# Patient Record
Sex: Female | Born: 1980
Health system: Southern US, Community
[De-identification: ages and names within clinical notes are randomized; demographics above are authoritative.]

## PROBLEM LIST (undated history)

## (undated) DIAGNOSIS — T783XXA Angioneurotic edema, initial encounter: Secondary | ICD-10-CM

## (undated) DIAGNOSIS — Z975 Presence of (intrauterine) contraceptive device: Secondary | ICD-10-CM

## (undated) DIAGNOSIS — L509 Urticaria, unspecified: Secondary | ICD-10-CM

## (undated) DIAGNOSIS — E039 Hypothyroidism, unspecified: Secondary | ICD-10-CM

## (undated) HISTORY — DX: Urticaria, unspecified: L50.9

## (undated) HISTORY — DX: Angioneurotic edema, initial encounter: T78.3XXA

## (undated) HISTORY — PX: COLONOSCOPY: SHX174

## (undated) HISTORY — DX: Hypothyroidism, unspecified: E03.9

## (undated) HISTORY — PX: NO PAST SURGERIES: SHX2092

## (undated) HISTORY — DX: Presence of (intrauterine) contraceptive device: Z97.5

---

## 2015-03-07 LAB — BASIC METABOLIC PANEL: Glucose: 88

## 2015-03-07 LAB — LIPID PANEL
Cholesterol: 144 (ref 0–200)
HDL: 67 (ref 35–70)
LDL Cholesterol: 69
TRIGLYCERIDES: 42 (ref 40–160)

## 2015-03-07 LAB — HM HIV SCREENING LAB: HM HIV Screening: NEGATIVE

## 2016-03-20 LAB — TSH: TSH: 0.99 (ref <=5.90)

## 2017-03-10 ENCOUNTER — Encounter: Payer: Self-pay | Admitting: Family Medicine

## 2017-03-10 ENCOUNTER — Encounter: Payer: Self-pay | Admitting: Internal Medicine

## 2017-03-10 ENCOUNTER — Ambulatory Visit (INDEPENDENT_AMBULATORY_CARE_PROVIDER_SITE_OTHER): Payer: 59 | Admitting: Family Medicine

## 2017-03-10 VITALS — BP 108/68 | HR 73 | Temp 98.2°F | Ht 69.0 in | Wt 188.0 lb

## 2017-03-10 DIAGNOSIS — Z0000 Encounter for general adult medical examination without abnormal findings: Secondary | ICD-10-CM

## 2017-03-10 DIAGNOSIS — Z8719 Personal history of other diseases of the digestive system: Secondary | ICD-10-CM

## 2017-03-10 DIAGNOSIS — Z975 Presence of (intrauterine) contraceptive device: Secondary | ICD-10-CM | POA: Diagnosis not present

## 2017-03-10 DIAGNOSIS — E039 Hypothyroidism, unspecified: Secondary | ICD-10-CM | POA: Diagnosis not present

## 2017-03-10 DIAGNOSIS — Z23 Encounter for immunization: Secondary | ICD-10-CM

## 2017-03-10 DIAGNOSIS — R1013 Epigastric pain: Secondary | ICD-10-CM

## 2017-03-10 LAB — COMPREHENSIVE METABOLIC PANEL
ALT: 16 U/L (ref 0–35)
AST: 16 U/L (ref 0–37)
Albumin: 4.4 g/dL (ref 3.5–5.2)
Alkaline Phosphatase: 41 U/L (ref 39–117)
BUN: 13 mg/dL (ref 6–23)
CO2: 29 mEq/L (ref 19–32)
Calcium: 9.9 mg/dL (ref 8.4–10.5)
Chloride: 105 mEq/L (ref 96–112)
Creatinine, Ser: 0.8 mg/dL (ref 0.40–1.20)
GFR: 86.84 mL/min (ref >60.00)
Glucose, Bld: 87 mg/dL (ref 70–99)
Potassium: 4.5 mEq/L (ref 3.5–5.1)
Sodium: 138 mEq/L (ref 135–145)
Total Bilirubin: 0.4 mg/dL (ref 0.2–1.2)
Total Protein: 7.2 g/dL (ref 6.0–8.3)

## 2017-03-10 LAB — CBC WITH DIFFERENTIAL/PLATELET
Basophils Absolute: 0.1 10*3/uL (ref 0.0–0.1)
Basophils Relative: 0.7 % (ref 0.0–3.0)
Eosinophils Absolute: 0.2 10*3/uL (ref 0.0–0.7)
Eosinophils Relative: 2.4 % (ref 0.0–5.0)
HCT: 40.2 % (ref 36.0–46.0)
Hemoglobin: 13.2 g/dL (ref 12.0–15.0)
Lymphocytes Relative: 24.6 % (ref 12.0–46.0)
Lymphs Abs: 1.7 10*3/uL (ref 0.7–4.0)
MCHC: 33 g/dL (ref 30.0–36.0)
MCV: 94.7 fl (ref 78.0–100.0)
Monocytes Absolute: 0.7 10*3/uL (ref 0.1–1.0)
Monocytes Relative: 10.4 % (ref 3.0–12.0)
Neutro Abs: 4.3 10*3/uL (ref 1.4–7.7)
Neutrophils Relative %: 61.9 % (ref 43.0–77.0)
Platelets: 289 10*3/uL (ref 150.0–400.0)
RBC: 4.25 Mil/uL (ref 3.87–5.11)
RDW: 13.2 % (ref 11.5–15.5)
WBC: 7 10*3/uL (ref 4.0–10.5)

## 2017-03-10 LAB — T4, FREE: Free T4: 1.11 ng/dL (ref 0.60–1.60)

## 2017-03-10 LAB — H. PYLORI ANTIBODY, IGG: H Pylori IgG: NEGATIVE

## 2017-03-10 LAB — TSH: TSH: 0.06 u[IU]/mL — ABNORMAL LOW (ref 0.35–4.50)

## 2017-03-15 ENCOUNTER — Encounter: Payer: Self-pay | Admitting: Family Medicine

## 2017-03-15 DIAGNOSIS — Z8719 Personal history of other diseases of the digestive system: Secondary | ICD-10-CM | POA: Insufficient documentation

## 2017-03-15 DIAGNOSIS — Z975 Presence of (intrauterine) contraceptive device: Secondary | ICD-10-CM

## 2017-03-15 DIAGNOSIS — R1013 Epigastric pain: Secondary | ICD-10-CM | POA: Insufficient documentation

## 2017-03-15 DIAGNOSIS — O99280 Endocrine, nutritional and metabolic diseases complicating pregnancy, unspecified trimester: Secondary | ICD-10-CM | POA: Insufficient documentation

## 2017-03-15 DIAGNOSIS — E039 Hypothyroidism, unspecified: Secondary | ICD-10-CM

## 2017-03-15 HISTORY — DX: Presence of (intrauterine) contraceptive device: Z97.5

## 2017-03-15 HISTORY — DX: Hypothyroidism, unspecified: E03.9

## 2017-03-15 MED ORDER — LEVOTHYROXINE SODIUM 125 MCG PO TABS: 125.0000 ug | ORAL_TABLET | Freq: Every day | ORAL | 3 refills | Status: DC

## 2017-03-15 NOTE — Progress Notes (Signed)
Tina [name] is a 36 y.o. female is here to [facility].   Patient Care Team: Briscoe Deutscher, DO as PCP - General (Family Medicine)   History of Present Illness:   HPI:   1. Hypothyroidism. Current symptoms: none. Patient denies change in energy level, diarrhea, nervousness, palpitations and weight changes. Symptoms have stabilized and been well-controlled.    2. GI Referral. Patient has had a few episodes of painless rectal bleed. She has also noted a few episodes of epigastric pain - sharp, lasting a few minutes, without noted triggers or relief. She has tried PPI, anti-gas medications, stretching, fiber. No significant family history. Her boyfriend is a GI physician and recommends and EGD and colonoscopy.    Health Maintenance Due  Topic Date Due  . HIV Screening  05/06/1997  . TETANUS/TDAP  05/06/2001  . PAP SMEAR  05/07/2003  . INFLUENZA VACCINE  11/19/2016   Depression screen PHQ 2/9 03/10/2017  Decreased Interest 0  Down, Depressed, Hopeless 0  PHQ - 2 Score 0   PMHx, SurgHx, SocialHx, Medications, and Allergies were reviewed in the Visit Navigator and updated as appropriate.   Past Medical History:  Diagnosis Date  . Hypothyroidism 03/15/2017  . IUD (intrauterine device) in place 03/15/2017   Mirena.    History reviewed. No pertinent surgical history. History reviewed. No pertinent family history.   Social History   Tobacco Use  . Smoking status: Never Smoker  . Smokeless tobacco: Never Used  Substance Use Topics  . Alcohol use: Yes    Comment: Social  . Drug use: No   Current Medications and Allergies:   .  levothyroxine (SYNTHROID, LEVOTHROID) 137 MCG tablet, levothyroxine 137 mcg tablet, Disp: , Rfl:  .  levonorgestrel (MIRENA, 52 MG,) 20 MCG/24HR IUD, Mirena 20 mcg/24 hr (5 years) intrauterine device  Take 1 device by intrauterine route., Disp: , Rfl:   Allergies  Allergen Reactions  . Lidocaine Itching    Per pt has had lidocaine after  and was ok  . Penicillins Rash   Review of Systems:   Pertinent items are noted in the HPI. Otherwise, ROS is negative.  Vitals:   Vitals:   03/10/17 1400  BP: 108/68  Pulse: 73  Temp: 98.2 F (36.8 C)  TempSrc: Oral  SpO2: 99%  Weight: 188 lb (85.3 kg)  Height: 5\' 9"  (1.753 m)     Body mass index is 27.76 kg/m.   Physical Exam:   Physical Exam  Constitutional: She is oriented to person, place, and time. She appears well-developed and well-nourished. No distress.  HENT:  Head: Normocephalic and atraumatic.  Right Ear: External ear normal.  Left Ear: External ear normal.  Nose: Nose normal.  Mouth/Throat: Oropharynx is clear and moist.  Eyes: Conjunctivae and EOM are normal. Pupils are equal, round, and reactive to light.  Neck: Normal range of motion. Neck supple. No thyromegaly present.  Cardiovascular: Normal rate, regular rhythm, normal heart sounds and intact distal pulses.  Pulmonary/Chest: Effort normal and breath sounds normal.  Abdominal: Soft. Bowel sounds are normal.  Musculoskeletal: Normal range of motion.  Lymphadenopathy:    She has no cervical adenopathy.  Neurological: She is alert and oriented to person, place, and time.  Skin: Skin is warm and dry. Capillary refill takes less than 2 seconds.  Psychiatric: She has a normal mood and affect. Her behavior is normal.  Nursing note and vitals reviewed.  Results for orders placed or performed in visit on 03/10/17  CBC with Differential/Platelet  Result Value Ref Range   WBC 7.0 4.0 - 10.5 K/uL   RBC 4.25 3.87 - 5.11 Mil/uL   Hemoglobin 13.2 12.0 - 15.0 g/dL   HCT 40.2 36.0 - 46.0 %   MCV 94.7 78.0 - 100.0 fl   MCHC 33.0 30.0 - 36.0 g/dL   RDW 13.2 11.5 - 15.5 %   Platelets 289.0 150.0 - 400.0 K/uL   Neutrophils Relative % 61.9 43.0 - 77.0 %   Lymphocytes Relative 24.6 12.0 - 46.0 %   Monocytes Relative 10.4 3.0 - 12.0 %   Eosinophils Relative 2.4 0.0 - 5.0 %   Basophils Relative 0.7 0.0 - 3.0 %     Neutro Abs 4.3 1.4 - 7.7 K/uL   Lymphs Abs 1.7 0.7 - 4.0 K/uL   Monocytes Absolute 0.7 0.1 - 1.0 K/uL   Eosinophils Absolute 0.2 0.0 - 0.7 K/uL   Basophils Absolute 0.1 0.0 - 0.1 K/uL  Comprehensive metabolic panel  Result Value Ref Range   Sodium 138 135 - 145 mEq/L   Potassium 4.5 3.5 - 5.1 mEq/L   Chloride 105 96 - 112 mEq/L   CO2 29 19 - 32 mEq/L   Glucose, Bld 87 70 - 99 mg/dL   BUN 13 6 - 23 mg/dL   Creatinine, Ser 0.80 0.40 - 1.20 mg/dL   Total Bilirubin 0.4 0.2 - 1.2 mg/dL   Alkaline Phosphatase 41 39 - 117 U/L   AST 16 0 - 37 U/L   ALT 16 0 - 35 U/L   Total Protein 7.2 6.0 - 8.3 g/dL   Albumin 4.4 3.5 - 5.2 g/dL   Calcium 9.9 8.4 - 10.5 mg/dL   GFR 86.84 >60.00 mL/min  TSH  Result Value Ref Range   TSH 0.06 (L) 0.35 - 4.50 uIU/mL  T4, free  Result Value Ref Range   Free T4 1.11 0.60 - 1.60 ng/dL  H. pylori antibody, IgG  Result Value Ref Range   H Pylori IgG Negative Negative   Assessment and Plan:   Diagnoses and all orders for this visit:  Routine physical examination  Acquired hypothyroidism Comments: Recheck of labs and adjustment today.  Orders: -     TSH -     T4, free  Epigastric pain Comments: Intermittent. Not better with standard treatment. To GI for EGD.  Orders: -     H. pylori antibody, IgG -     Ambulatory referral to Gastroenterology  History of rectal bleeding Comments: Painless. > 1 episode. Will refer to GI. Orders: -     CBC with Differential/Platelet -     Comprehensive metabolic panel -     Ambulatory referral to Gastroenterology  IUD (intrauterine device) in place Comments: Mirena.   Need for HPV vaccination Comments: Agreed to vaccine, given new indications. Will check to see if insurance covers.   Patient Counseling:   [x]     Nutrition: Stressed importance of moderation in sodium/caffeine intake, saturated fat and cholesterol, caloric balance, sufficient intake of fresh fruits, vegetables, fiber, calcium,  iron, and 1 mg of folate supplement per day (for females capable of pregnancy).   [x]      Stressed the importance of regular exercise.    [x]     Substance Abuse: Discussed cessation/primary prevention of tobacco, alcohol, or other drug use; driving or other dangerous activities under the influence; availability of treatment for abuse.    [x]      Injury prevention: Discussed safety belts, safety helmets, smoke  detector, smoking near bedding or upholstery.    [x]      Sexuality: Discussed sexually transmitted diseases, partner selection, use of condoms, avoidance of unintended pregnancy  and contraceptive alternatives.    [x]     Dental health: Discussed importance of regular tooth brushing, flossing, and dental visits.   [x]      Health maintenance and immunizations reviewed. Please refer to Health maintenance section.   . Reviewed expectations re: course of current medical issues. . Discussed self-management of symptoms. . Outlined signs and symptoms indicating need for more acute intervention. . Patient verbalized understanding and all questions were answered. Marland Kitchen Health Maintenance issues including appropriate healthy diet, exercise, and smoking avoidance were discussed with patient. . See orders for this visit as documented in the electronic medical record. . Patient received an After Visit Summary.   Briscoe Deutscher, DO [place] [place] 03/15/2017  Future Appointments  Date Time Provider [place]  05/13/2017  8:30 AM Pyrtle, Lajuan Lines, MD [facility]

## 2017-03-16 ENCOUNTER — Other Ambulatory Visit: Payer: Self-pay

## 2017-03-16 ENCOUNTER — Other Ambulatory Visit: Payer: Self-pay | Admitting: Surgical

## 2017-03-16 DIAGNOSIS — E039 Hypothyroidism, unspecified: Secondary | ICD-10-CM

## 2017-03-16 MED ORDER — LEVOTHYROXINE SODIUM 125 MCG PO TABS: 125.0000 ug | ORAL_TABLET | Freq: Every day | ORAL | 3 refills | Status: DC

## 2017-03-16 MED FILL — LEVOTHYROXINE 125 MCG TABLE: 125 | 90 days supply | Qty: 90 | Fill #0

## 2017-03-26 ENCOUNTER — Encounter: Payer: Self-pay | Admitting: Surgical

## 2017-04-17 DIAGNOSIS — H00021 Hordeolum internum right upper eyelid: Secondary | ICD-10-CM | POA: Diagnosis not present

## 2017-04-17 DIAGNOSIS — H5213 Myopia, bilateral: Secondary | ICD-10-CM | POA: Diagnosis not present

## 2017-04-17 DIAGNOSIS — Z973 Presence of spectacles and contact lenses: Secondary | ICD-10-CM | POA: Diagnosis not present

## 2017-04-23 ENCOUNTER — Encounter: Payer: Self-pay | Admitting: Physical Therapy

## 2017-05-05 ENCOUNTER — Other Ambulatory Visit (INDEPENDENT_AMBULATORY_CARE_PROVIDER_SITE_OTHER): Payer: 59

## 2017-05-05 DIAGNOSIS — E039 Hypothyroidism, unspecified: Secondary | ICD-10-CM | POA: Diagnosis not present

## 2017-05-05 LAB — TSH: TSH: 1.6 u[IU]/mL (ref 0.35–4.50)

## 2017-05-13 ENCOUNTER — Encounter: Payer: Self-pay | Admitting: Internal Medicine

## 2017-05-13 ENCOUNTER — Ambulatory Visit (INDEPENDENT_AMBULATORY_CARE_PROVIDER_SITE_OTHER): Payer: 59 | Admitting: Internal Medicine

## 2017-05-13 VITALS — BP 106/62 | HR 70 | Ht 69.0 in | Wt 190.0 lb

## 2017-05-13 DIAGNOSIS — K625 Hemorrhage of anus and rectum: Secondary | ICD-10-CM | POA: Diagnosis not present

## 2017-05-13 DIAGNOSIS — R1012 Left upper quadrant pain: Secondary | ICD-10-CM

## 2017-05-13 MED ORDER — GLYCOPYRROLATE 1 MG PO TABS: 1.0000 mg | ORAL_TABLET | Freq: Two times a day (BID) | ORAL | 2 refills | Status: DC | PRN

## 2017-05-13 MED ORDER — SUPREP BOWEL PREP KIT 17.5-3.13-1.6 GM/177ML PO SOLN: 1.0000 | ORAL | 0 refills | Status: DC

## 2017-05-13 MED FILL — SUPREP BOWEL PREP KIT: 17.5-3.13-1 | 1 days supply | Qty: 354 | Fill #0

## 2017-05-13 MED FILL — GLYCOPYRROLATE 1 MG TABLET: 1 | 30 days supply | Qty: 60 | Fill #0

## 2017-05-13 NOTE — Patient Instructions (Signed)
You have been scheduled for an endoscopy and colonoscopy. Please follow the written instructions given to you at your visit today. Please pick up your prep supplies at the pharmacy within the next 1-3 days. If you use inhalers (even only as needed), please bring them with you on the day of your procedure. Your physician has requested that you go to www.startemmi.com and enter the access code given to you at your visit today. This web site gives a general overview about your procedure. However, you should still follow specific instructions given to you by our office regarding your preparation for the procedure.  We have sent the following medications to your pharmacy for you to pick up at your convenience: Robinul 1 mg twice daily as needed  If you are age 27 or older, your body mass index should be between 23-30. Your Body mass index is 28.06 kg/m. If this is out of the aforementioned range listed, please consider follow up with your Primary Care Provider.  If you are age 75 or younger, your body mass index should be between 19-25. Your Body mass index is 28.06 kg/m. If this is out of the aformentioned range listed, please consider follow up with your Primary Care Provider.

## 2017-05-13 NOTE — Progress Notes (Signed)
Patient ID: Tina [name], female   DOB: 1980/06/25, 37 y.o.   MRN: [id number] HPI: Tina [name] is a 37 year old female with history of hypothyroidism who is seen in consultation at the request of Dr. Juleen China to discuss intermittent rectal bleeding and intermittent left middle abdominal pain.  She is here alone today.  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    She reports that she has had 3 episodes of painless bright red blood per rectum.  This is been over the past year.  Last episode was in November 2018 when she had what she described as "a lot" of blood per rectum.  This was associated with a bowel movement and was located on the stool, in the toilet water but also with wiping.  This was painless in nature.  She does not know if she has hemorrhoids as she does not have prolapsing, itching or perianal discomfort.  She can be constipated if she does not eat enough fiber in her diet.  No diarrhea.  No recent abdominal pain.  She states that she has had episodic left middle abdominal pain attacks since she was a teenager.  She estimates this occurs 6-12 times per year.  It can last a few hours and tends to be crescendo decrescendo in nature.  The only thing she knows to make it better is lying flat on her back.  Tends to happen later in the day.  Is not associated with food, gas, bowel movement.  Is located left of center in the mid abdomen.  Not associated with nausea or vomiting.  Pain can be intense and at times double her over and require her to leave work.  She states her boyfriend who is a gastroenterologist witnessed 1 of these attacks and he recommended she be evaluated.  No prior abdominal surgeries.  She does not use any medications other than levothyroxine with a dose adjustment recently and she has an IUD in place.  Family history negative for GI tract malignancy.  Her mother has chronic myeloid leukemia and her father has a history of an acoustic  neuroma.  She does not smoke and never has.  Rare alcohol.  No illicit drug use.  No prior GI evaluation  Past Medical History:  Diagnosis Date  . Hypothyroidism, Levothyroxine only 03/15/2017  . Mirena IUD (intrauterine device) in place 03/15/2017    History reviewed. No pertinent surgical history.  Outpatient Medications Prior to Visit  Medication Sig Dispense Refill  . levonorgestrel (MIRENA, 52 MG,) 20 MCG/24HR IUD Mirena 20 mcg/24 hr (5 years) intrauterine device  Take 1 device by intrauterine route.    Marland Kitchen levothyroxine (SYNTHROID, LEVOTHROID) 125 MCG tablet Take 1 tablet (125 mcg total) by mouth daily. 90 tablet 3  . levothyroxine (SYNTHROID, LEVOTHROID) 137 MCG tablet levothyroxine 137 mcg tablet     No facility-administered medications prior to visit.     Allergies  Allergen Reactions  . Lidocaine Itching    Per pt has had lidocaine after and was ok  . Penicillins Rash    Family History  Problem Relation Age of Onset  . Arthritis Mother   . Cancer Mother     Social History   Tobacco Use  . Smoking status: Never Smoker  . Smokeless tobacco: Never Used  Substance Use Topics  . Alcohol use: Yes    Comment: Social  . Drug use: No    ROS: As per history of present illness,  otherwise negative  BP 106/62   Pulse 70   Ht 5\' 9"  (1.753 m)   Wt 190 lb (86.2 kg)   BMI 28.06 kg/m  Constitutional: Well-developed and well-nourished. No distress. HEENT: Normocephalic and atraumatic. Conjunctivae are normal.  No scleral icterus. Neck: Neck supple. Trachea midline. Cardiovascular: Normal rate, regular rhythm and intact distal pulses. No M/R/G Pulmonary/chest: Effort normal and breath sounds normal. No wheezing, rales or rhonchi. Abdominal: Soft, nontender, nondistended. Bowel sounds active throughout. There are no masses palpable. No hepatosplenomegaly. Extremities: no clubbing, cyanosis, or edema Neurological: Alert and oriented to person place and time. Skin:  Skin is warm and dry.  Psychiatric: Normal mood and affect. Behavior is normal.  RELEVANT LABS AND IMAGING: CBC    Component Value Date/Time   WBC 7.0 03/10/2017 1427   RBC 4.25 03/10/2017 1427   HGB 13.2 03/10/2017 1427   HCT 40.2 03/10/2017 1427   PLT 289.0 03/10/2017 1427   MCV 94.7 03/10/2017 1427   MCHC 33.0 03/10/2017 1427   RDW 13.2 03/10/2017 1427   LYMPHSABS 1.7 03/10/2017 1427   MONOABS 0.7 03/10/2017 1427   EOSABS 0.2 03/10/2017 1427   BASOSABS 0.1 03/10/2017 1427    CMP     Component Value Date/Time   NA 138 03/10/2017 1427   K 4.5 03/10/2017 1427   CL 105 03/10/2017 1427   CO2 29 03/10/2017 1427   GLUCOSE 87 03/10/2017 1427   BUN 13 03/10/2017 1427   CREATININE 0.80 03/10/2017 1427   CALCIUM 9.9 03/10/2017 1427   PROT 7.2 03/10/2017 1427   ALBUMIN 4.4 03/10/2017 1427   AST 16 03/10/2017 1427   ALT 16 03/10/2017 1427   ALKPHOS 41 03/10/2017 1427   BILITOT 0.4 03/10/2017 1427   H Pylori IgG neg  ASSESSMENT/PLAN: 36 year old female with history of hypothyroidism who is seen in consultation at the request of Dr. Juleen China to discuss intermittent rectal bleeding and intermittent left middle abdominal pain.  1. Rectal bleeding --could be secondary to internal hemorrhoids.  We discussed the differential which she is well aware of.  I have recommended colonoscopy to exclude other pathology.  We discussed the risks, benefits and alternatives and she is agreeable and wishes to proceed.  If she is found to have internal hemorrhoids we can discuss treatment thereafter including medical and nonsurgical hemorrhoidal banding should symptoms recur on a regular basis.  2.  Episodic left-sided abdominal pain. --Unclear etiology but does sound either musculoskeletal or intestinal.  My suspicion for upper GI tract pathology is low however given the intensity and severity of these attacks I will evaluate further with upper endoscopy.  This will be performed on the same day as her  colonoscopy as discussed above.  We reviewed the risk, benefits and alternatives and she is agreeable and wishes to proceed.  Cross-sectional imaging unlikely to be helpful in the absence of an attack and so I have given her my number to notify me when a future attack occurs.  Would consider cross-sectional imaging during an attack to exclude internal hernia, intussusception, etc.  Prescription for Robinul Forte 2 mg twice daily as needed for these attacks which hopefully can help abort or shorten the intensity and severity of the attack.      [id number], [place], [address] [place], [place] [id number]

## 2017-06-03 MED FILL — LEVOTHYROXINE 125 MCG TABLE: 125 | 90 days supply | Qty: 90 | Fill #1

## 2017-07-13 ENCOUNTER — Encounter: Payer: Self-pay | Admitting: Internal Medicine

## 2017-07-23 ENCOUNTER — Other Ambulatory Visit: Payer: Self-pay

## 2017-07-23 ENCOUNTER — Ambulatory Visit (AMBULATORY_SURGERY_CENTER): Payer: 59 | Admitting: Internal Medicine

## 2017-07-23 ENCOUNTER — Encounter: Payer: Self-pay | Admitting: Internal Medicine

## 2017-07-23 VITALS — BP 101/64 | HR 58 | Temp 98.6°F | Resp 15 | Ht 69.0 in | Wt 190.0 lb

## 2017-07-23 DIAGNOSIS — Z1211 Encounter for screening for malignant neoplasm of colon: Secondary | ICD-10-CM | POA: Diagnosis not present

## 2017-07-23 DIAGNOSIS — K625 Hemorrhage of anus and rectum: Secondary | ICD-10-CM

## 2017-07-23 DIAGNOSIS — R1012 Left upper quadrant pain: Secondary | ICD-10-CM

## 2017-07-23 DIAGNOSIS — D123 Benign neoplasm of transverse colon: Secondary | ICD-10-CM

## 2017-07-23 MED ORDER — SODIUM CHLORIDE 0.9 % IV SOLN: 500.0000 mL | Freq: Once | INTRAVENOUS | Status: DC

## 2017-07-23 NOTE — Progress Notes (Signed)
Called to room to assist during endoscopic procedure.  Patient ID and intended procedure confirmed with present staff. Received instructions for my participation in the procedure from the performing physician.  

## 2017-07-23 NOTE — Patient Instructions (Signed)
Discharge instructions given. Handout on polyps. Resume previous medications. YOU HAD AN ENDOSCOPIC PROCEDURE TODAY AT [facility]:   Refer to the procedure report that was given to you for any specific questions about what was found during the examination.  If the procedure report does not answer your questions, please call your gastroenterologist to clarify.  If you requested that your care partner not be given the details of your procedure findings, then the procedure report has been included in a sealed envelope for you to review at your convenience later.  YOU SHOULD EXPECT: Some feelings of bloating in the abdomen. Passage of more gas than usual.  Walking can help get rid of the air that was put into your GI tract during the procedure and reduce the bloating. If you had a lower endoscopy (such as a colonoscopy or flexible sigmoidoscopy) you may notice spotting of blood in your stool or on the toilet paper. If you underwent a bowel prep for your procedure, you may not have a normal bowel movement for a few days.  Please Note:  You might notice some irritation and congestion in your nose or some drainage.  This is from the oxygen used during your procedure.  There is no need for concern and it should clear up in a day or so.  SYMPTOMS TO REPORT IMMEDIATELY:   Following lower endoscopy (colonoscopy or flexible sigmoidoscopy):  Excessive amounts of blood in the stool  Significant tenderness or worsening of abdominal pains  Swelling of the abdomen that is new, acute  Fever of 100F or higher   Following upper endoscopy (EGD)  Vomiting of blood or coffee ground material  New chest pain or pain under the shoulder blades  Painful or persistently difficult swallowing  New shortness of breath  Fever of 100F or higher  Black, tarry-looking stools  For urgent or emergent issues, a gastroenterologist can be reached at any hour by calling [phone].   DIET:  We do  recommend a small meal at first, but then you may proceed to your regular diet.  Drink plenty of fluids but you should avoid alcoholic beverages for 24 hours.  ACTIVITY:  You should plan to take it easy for the rest of today and you should NOT DRIVE or use heavy machinery until tomorrow (because of the sedation medicines used during the test).    FOLLOW UP: Our staff will call the number listed on your records the next business day following your procedure to check on you and address any questions or concerns that you may have regarding the information given to you following your procedure. If we do not reach you, we will leave a message.  However, if you are feeling well and you are not experiencing any problems, there is no need to return our call.  We will assume that you have returned to your regular daily activities without incident.  If any biopsies were taken you will be contacted by phone or by letter within the next 1-3 weeks.  Please call us at [phone] if you have not heard about the biopsies in 3 weeks.    SIGNATURES/CONFIDENTIALITY: You and/or your care partner have signed paperwork which will be entered into your electronic medical record.  These signatures attest to the fact that that the information above on your After Visit Summary has been reviewed and is understood.  Full responsibility of the confidentiality of this discharge information lies with you and/or your care-partner.

## 2017-07-23 NOTE — Op Note (Signed)
[facility] Patient Name: Tina [name] Procedure Date: 07/23/2017 1:30 PM MRN: [id number] Endoscopist: Jerene Bears , MD Age: 37 Referring MD:  Date of Birth: [birth year] Gender: Female Account #: [id number] Procedure:                Colonoscopy Indications:              Rectal bleeding Medicines:                Monitored Anesthesia Care Procedure:                Pre-Anesthesia Assessment:                           - Prior to the procedure, a History and Physical                            was performed, and patient medications and                            allergies were reviewed. The patient's tolerance of                            previous anesthesia was also reviewed. The risks                            and benefits of the procedure and the sedation                            options and risks were discussed with the patient.                            All questions were answered, and informed consent                            was obtained. Prior Anticoagulants: The patient has                            taken no previous anticoagulant or antiplatelet                            agents. ASA Grade Assessment: I - A normal, healthy                            patient. After reviewing the risks and benefits,                            the patient was deemed in satisfactory condition to                            undergo the procedure.                           After obtaining informed consent, the colonoscope  was passed under direct vision. Throughout the                            procedure, the patient's blood pressure, pulse, and                            oxygen saturations were monitored continuously. The                            Colonoscope was introduced through the anus and                            advanced to the the cecum, identified by                            appendiceal orifice and ileocecal valve. The   colonoscopy was performed without difficulty. The                            patient tolerated the procedure well. The quality                            of the bowel preparation was good. The ileocecal                            valve, appendiceal orifice, and rectum were                            photographed. Scope In: 1:54:02 PM Scope Out: 2:09:23 PM Scope Withdrawal Time: 0 hours 9 minutes 7 seconds  Total Procedure Duration: 0 hours 15 minutes 21 seconds  Findings:                 The digital rectal exam was normal.                           A 5 mm polyp was found in the splenic flexure. The                            polyp was sessile. The polyp was removed with a                            cold snare. Resection and retrieval were complete.                           The exam was otherwise without abnormality on                            direct and retroflexion views. Complications:            No immediate complications. Estimated Blood Loss:     Estimated blood loss was minimal. Impression:               - One 5 mm polyp at the splenic flexure, removed  with a cold snare. Resected and retrieved.                           - The examination was otherwise normal on direct                            and retroflexion views. Recommendation:           - Patient has a contact number available for                            emergencies. The signs and symptoms of potential                            delayed complications were discussed with the                            patient. Return to normal activities tomorrow.                            Written discharge instructions were provided to the                            patient.                           - Resume previous diet.                           - Continue present medications.                           - Await pathology results.                           - Repeat colonoscopy is recommended. The                             colonoscopy date will be determined after pathology                            results from today's exam become available for                            review. Jerene Bears, MD 07/23/2017 2:15:12 PM This report has been signed electronically.

## 2017-07-23 NOTE — Progress Notes (Signed)
A/ox3 pleased with MAC, report to [facility]

## 2017-07-23 NOTE — Op Note (Signed)
[facility] Patient Name: Tina [name] Procedure Date: 07/23/2017 1:30 PM MRN: [id number] Endoscopist: Jerene Bears , MD Age: 37 Referring MD:  Date of Birth: 08/26/1980 Gender: Female Account #: [id number] Procedure:                Upper GI endoscopy Indications:              Abdominal pain in the left upper quadrant Medicines:                Monitored Anesthesia Care Procedure:                Pre-Anesthesia Assessment:                           - Prior to the procedure, a History and Physical                            was performed, and patient medications and                            allergies were reviewed. The patient's tolerance of                            previous anesthesia was also reviewed. The risks                            and benefits of the procedure and the sedation                            options and risks were discussed with the patient.                            All questions were answered, and informed consent                            was obtained. Prior Anticoagulants: The patient has                            taken no previous anticoagulant or antiplatelet                            agents. ASA Grade Assessment: I - A normal, healthy                            patient. After reviewing the risks and benefits,                            the patient was deemed in satisfactory condition to                            undergo the procedure.                           After obtaining informed consent, the endoscope was  passed under direct vision. Throughout the                            procedure, the patient's blood pressure, pulse, and                            oxygen saturations were monitored continuously. The                            Endoscope was introduced through the mouth, and                            advanced to the second part of duodenum. The upper                            GI endoscopy was accomplished  without difficulty.                            The patient tolerated the procedure well. Scope In: Scope Out: Findings:                 The esophagus was normal.                           The stomach was normal.                           The examined duodenum was normal. Complications:            No immediate complications. Estimated Blood Loss:     Estimated blood loss: none. Impression:               - Normal esophagus.                           - Normal stomach.                           - Normal examined duodenum.                           - No specimens collected. Recommendation:           - Patient has a contact number available for                            emergencies. The signs and symptoms of potential                            delayed complications were discussed with the                            patient. Return to normal activities tomorrow.                            Written discharge instructions were provided to the  patient.                           - Resume previous diet.                           - Continue present medications. Jerene Bears, MD 07/23/2017 2:13:41 PM This report has been signed electronically.

## 2017-07-24 ENCOUNTER — Telehealth: Payer: Self-pay

## 2017-07-24 NOTE — Telephone Encounter (Signed)
  Follow up Call-  Call Tina [name] number 07/23/2017  Post procedure Call Tina [name] phone  # [phone]  Permission to leave phone message Yes     Patient questions:  Do you have a fever, pain , or abdominal swelling? No. Pain Score  0 *  Have you tolerated food without any problems? Yes.    Have you been able to return to your normal activities? Yes.    Do you have any questions about your discharge instructions: Diet   No. Medications  No. Follow up visit  No.  Do you have questions or concerns about your Care? No.  Actions: * If pain score is 4 or above: No action needed, pain <4.

## 2017-07-27 ENCOUNTER — Encounter: Payer: 59 | Admitting: Internal Medicine

## 2017-07-29 ENCOUNTER — Encounter: Payer: Self-pay | Admitting: Internal Medicine

## 2017-09-13 ENCOUNTER — Encounter: Payer: Self-pay | Admitting: Family Medicine

## 2017-09-18 MED FILL — LEVOTHYROXINE 125 MCG TABLE: 125 | 90 days supply | Qty: 90 | Fill #2

## 2017-09-21 ENCOUNTER — Other Ambulatory Visit: Payer: Self-pay | Admitting: Surgical

## 2017-09-21 DIAGNOSIS — L989 Disorder of the skin and subcutaneous tissue, unspecified: Secondary | ICD-10-CM

## 2017-10-02 DIAGNOSIS — Z973 Presence of spectacles and contact lenses: Secondary | ICD-10-CM | POA: Diagnosis not present

## 2017-10-02 DIAGNOSIS — H5213 Myopia, bilateral: Secondary | ICD-10-CM | POA: Diagnosis not present

## 2017-10-02 DIAGNOSIS — H04123 Dry eye syndrome of bilateral lacrimal glands: Secondary | ICD-10-CM | POA: Diagnosis not present

## 2017-12-16 MED FILL — LEVOTHYROXINE 125 MCG TABLE: 125 | 90 days supply | Qty: 90 | Fill #3

## 2017-12-23 ENCOUNTER — Encounter: Payer: Self-pay | Admitting: Family Medicine

## 2017-12-30 DIAGNOSIS — D224 Melanocytic nevi of scalp and neck: Secondary | ICD-10-CM | POA: Diagnosis not present

## 2017-12-30 DIAGNOSIS — L811 Chloasma: Secondary | ICD-10-CM | POA: Diagnosis not present

## 2017-12-30 DIAGNOSIS — D229 Melanocytic nevi, unspecified: Secondary | ICD-10-CM | POA: Diagnosis not present

## 2017-12-30 DIAGNOSIS — D225 Melanocytic nevi of trunk: Secondary | ICD-10-CM | POA: Diagnosis not present

## 2017-12-30 DIAGNOSIS — D485 Neoplasm of uncertain behavior of skin: Secondary | ICD-10-CM | POA: Diagnosis not present

## 2017-12-30 DIAGNOSIS — L905 Scar conditions and fibrosis of skin: Secondary | ICD-10-CM | POA: Diagnosis not present

## 2018-01-07 DIAGNOSIS — L811 Chloasma: Secondary | ICD-10-CM

## 2018-01-07 DIAGNOSIS — D485 Neoplasm of uncertain behavior of skin: Secondary | ICD-10-CM | POA: Insufficient documentation

## 2018-01-07 DIAGNOSIS — D224 Melanocytic nevi of scalp and neck: Secondary | ICD-10-CM | POA: Insufficient documentation

## 2018-01-07 DIAGNOSIS — D229 Melanocytic nevi, unspecified: Secondary | ICD-10-CM | POA: Insufficient documentation

## 2018-03-01 NOTE — Progress Notes (Deleted)
Subjective:    Tina [name] is a 37 y.o. female and is here for a comprehensive physical exam.   Current Outpatient Medications:  .  glycopyrrolate (ROBINUL) 1 MG tablet, Take 1 tablet (1 mg total) by mouth 2 (two) times daily as needed. (Patient not taking: Reported on 07/23/2017), Disp: 60 tablet, Rfl: 2 .  levonorgestrel (MIRENA, 52 MG,) 20 MCG/24HR IUD, Mirena 20 mcg/24 hr (5 years) intrauterine device  Take 1 device by intrauterine route., Disp: , Rfl:  .  levothyroxine (SYNTHROID, LEVOTHROID) 125 MCG tablet, Take 1 tablet (125 mcg total) by mouth daily., Disp: 90 tablet, Rfl: 3  Current Facility-Administered Medications:  .  0.9 %  sodium chloride infusion, 500 mL, Intravenous, Once, Pyrtle, Lajuan Lines, MD  Health Maintenance Due  Topic Date Due  . INFLUENZA VACCINE  11/19/2017    PMHx, SurgHx, SocialHx, Medications, and Allergies were reviewed in the Visit Navigator and updated as appropriate.   Past Medical History:  Diagnosis Date  . Hypothyroidism, Levothyroxine only 03/15/2017  . Mirena IUD (intrauterine device) in place 03/15/2017    No past surgical history on file.  Family History  Problem Relation Age of Onset  . Arthritis Mother   . Cancer Mother     Social History   Tobacco Use  . Smoking status: Never Smoker  . Smokeless tobacco: Never Used  Substance Use Topics  . Alcohol use: Yes    Comment: Social  . Drug use: No    Review of Systems:   Pertinent items are noted in the HPI. Otherwise, ROS is negative.  Objective:   There were no vitals taken for this visit.   General appearance: alert, cooperative and appears stated age. Head: normocephalic, without obvious abnormality, atraumatic. Neck: no adenopathy, supple, symmetrical, trachea midline; thyroid not enlarged, symmetric, no tenderness/mass/nodules. Lungs: clear to auscultation bilaterally. Breasts: inspection negative, no nipple retraction or dimpling, no nipple discharge or bleeding,  no axillary or supraclavicular adenopathy, normal to palpation without dominant masses. Heart: regular rate and rhythm Abdomen: soft, non-tender; no masses,  no organomegaly. Extremities: extremities normal, atraumatic, no cyanosis or edema. Skin: skin color, texture, turgor normal, no rashes or lesions. Lymph: cervical, supraclavicular, and axillary nodes normal; no abnormal inguinal nodes palpated. Neurologic: grossly normal.  Pelvic:  External genitalia: no lesions. Urethra: normal appearing urethra with no masses, tenderness or lesions. Bartholins and Skenes: normal. Vagina: normal appearing vagina with normal color and discharge, no lesions. Cervix: normal appearance. Pap and high risk HPV testing done: {yes no:314532} Bimanual Exam:   Uterus: uterus is normal size, shape, consistency and nontender. Adnexa: normal adnexa in size, nontender and no masses.  Assessment/Plan:   There are no diagnoses linked to this encounter.  Patient Counseling:   [x]     Nutrition: Stressed importance of moderation in sodium/caffeine intake, saturated fat and cholesterol, caloric balance, sufficient intake of fresh fruits, vegetables, fiber, calcium, iron, and 1 mg of folate supplement per day (for females capable of pregnancy).   [x]      Stressed the importance of regular exercise.    [x]     Substance Abuse: Discussed cessation/primary prevention of tobacco, alcohol, or other drug use; driving or other dangerous activities under the influence; availability of treatment for abuse.    [x]      Injury prevention: Discussed safety belts, safety helmets, smoke detector, smoking near bedding or upholstery.    [x]      Sexuality: Discussed sexually transmitted diseases, partner selection, use of condoms, avoidance of  unintended pregnancy  and contraceptive alternatives.    [x]     Dental health: Discussed importance of regular tooth brushing, flossing, and dental visits.   [x]      Health maintenance and  immunizations reviewed. Please refer to Health maintenance section.   Briscoe Deutscher, DO [facility]

## 2018-03-03 ENCOUNTER — Encounter: Payer: 59 | Admitting: Family Medicine

## 2018-03-15 ENCOUNTER — Encounter: Payer: Self-pay | Admitting: Family Medicine

## 2018-03-24 ENCOUNTER — Encounter: Payer: Self-pay | Admitting: Family Medicine

## 2018-03-24 ENCOUNTER — Other Ambulatory Visit: Payer: Self-pay

## 2018-03-24 DIAGNOSIS — E039 Hypothyroidism, unspecified: Secondary | ICD-10-CM

## 2018-03-24 MED ORDER — LEVOTHYROXINE SODIUM 125 MCG PO TABS: 125.0000 ug | ORAL_TABLET | Freq: Every day | ORAL | 0 refills | Status: DC

## 2018-03-24 MED FILL — LEVOTHYROXINE 125 MCG TABLE: 125 | 90 days supply | Qty: 90 | Fill #0

## 2018-03-29 ENCOUNTER — Other Ambulatory Visit (HOSPITAL_COMMUNITY)
Admission: RE | Admit: 2018-03-29 | Discharge: 2018-03-29 | Disposition: A | Discharge status: Home / Self Care | Payer: 59 | Source: Ambulatory Visit | Attending: Family Medicine | Admitting: Family Medicine

## 2018-03-29 ENCOUNTER — Ambulatory Visit (INDEPENDENT_AMBULATORY_CARE_PROVIDER_SITE_OTHER): Payer: 59 | Admitting: Family Medicine

## 2018-03-29 ENCOUNTER — Encounter: Payer: Self-pay | Admitting: Family Medicine

## 2018-03-29 VITALS — BP 108/58 | HR 61 | Temp 98.6°F | Ht 69.0 in | Wt 178.0 lb

## 2018-03-29 DIAGNOSIS — E559 Vitamin D deficiency, unspecified: Secondary | ICD-10-CM

## 2018-03-29 DIAGNOSIS — Z0000 Encounter for general adult medical examination without abnormal findings: Secondary | ICD-10-CM | POA: Insufficient documentation

## 2018-03-29 DIAGNOSIS — Z202 Contact with and (suspected) exposure to infections with a predominantly sexual mode of transmission: Secondary | ICD-10-CM | POA: Diagnosis not present

## 2018-03-29 DIAGNOSIS — R5383 Other fatigue: Secondary | ICD-10-CM | POA: Diagnosis not present

## 2018-03-29 DIAGNOSIS — Z13220 Encounter for screening for lipoid disorders: Secondary | ICD-10-CM | POA: Diagnosis not present

## 2018-03-29 DIAGNOSIS — T781XXS Other adverse food reactions, not elsewhere classified, sequela: Secondary | ICD-10-CM

## 2018-03-29 DIAGNOSIS — R202 Paresthesia of skin: Secondary | ICD-10-CM

## 2018-03-29 DIAGNOSIS — E039 Hypothyroidism, unspecified: Secondary | ICD-10-CM

## 2018-03-29 NOTE — Progress Notes (Signed)
Subjective:    Tina [name] is a 37 y.o. female and is here for a comprehensive physical exam. Married now. Planning to try for baby in the next year. On [place]. Job is going well. Husband is GI about an hour away, made partner. They will be commuting to [place] soon.  Current Outpatient Medications:  .  levonorgestrel (MIRENA, 52 MG,) 20 MCG/24HR IUD, Mirena 20 mcg/24 hr (5 years) intrauterine device  Take 1 device by intrauterine route., Disp: , Rfl:  .  levothyroxine (SYNTHROID, LEVOTHROID) 125 MCG tablet, Take 1 tablet (125 mcg total) by mouth daily., Disp: 90 tablet, Rfl: 0  There are no preventive care reminders to display for this patient.  PMHx, SurgHx, SocialHx, Medications, and Allergies were reviewed in the Visit Navigator and updated as appropriate.   Past Medical History:  Diagnosis Date  . Hypothyroidism, Levothyroxine only 03/15/2017  . Mirena IUD (intrauterine device) in place 03/15/2017    History reviewed. No pertinent surgical history.   Family History  Problem Relation Age of Onset  . Arthritis Mother   . Cancer Mother    Social History   Tobacco Use  . Smoking status: Never Smoker  . Smokeless tobacco: Never Used  Substance Use Topics  . Alcohol use: Yes    Comment: Social  . Drug use: No    Review of Systems:   Pertinent items are noted in the HPI. Otherwise, ROS is negative.  Objective:   BP (!) 108/58   Pulse 61   Temp 98.6 F (37 C) (Oral)   Ht 5\' 9"  (1.753 m)   Wt 178 lb (80.7 kg)   SpO2 98%   BMI 26.29 kg/m    General appearance: alert, cooperative and appears stated age. Head: normocephalic, without obvious abnormality, atraumatic. Neck: no adenopathy, supple, symmetrical, trachea midline; thyroid not enlarged, symmetric, no tenderness/mass/nodules. Lungs: clear to auscultation bilaterally. Heart: regular rate and rhythm Abdomen: soft, non-tender; no masses,  no organomegaly. Extremities: extremities normal, atraumatic, no  cyanosis or edema. Skin: skin color, texture, turgor normal, no rashes or lesions. Lymph: cervical, supraclavicular, and axillary nodes normal; no abnormal inguinal nodes palpated. Neurologic: grossly normal.  Assessment/Plan:   Tina [name] was seen today for annual exam.  Diagnoses and all orders for this visit:  Routine physical examination  Acquired hypothyroidism Comments: Previously at goal after adjustment. Recheck today. Orders: -     TSH -     T4, free  Fatigue, unspecified type -     CBC with Differential/Platelet -     Comprehensive metabolic panel -     Vitamin B12  Screening for lipid disorders -     Lipid panel  Vitamin D deficiency -     VITAMIN D 25 Hydroxy (Vit-D Deficiency, Fractures)  Paresthesias Comments: UE bilateral. Intermittent. Previously work up by Neurology at [facility]. Demyelination.  Orders: -     Vitamin B12  Allergic reaction to food, sequela Comments: Oral edema after eating hot cheetos popcorn. Would like allergy testing.  Orders: -     Ambulatory referral to Allergy  Exposure to STD -     Urine cytology ancillary only()   Patient Counseling:   [x]     Nutrition: Stressed importance of moderation in sodium/caffeine intake, saturated fat and cholesterol, caloric balance, sufficient intake of fresh fruits, vegetables, fiber, calcium, iron, and 1 mg of folate supplement per day (for females capable of pregnancy).   [x]      Stressed the importance of regular exercise.    [  x]    Substance Abuse: Discussed cessation/primary prevention of tobacco, alcohol, or other drug use; driving or other dangerous activities under the influence; availability of treatment for abuse.    [x]      Injury prevention: Discussed safety belts, safety helmets, smoke detector, smoking near bedding or upholstery.    [x]      Sexuality: Discussed sexually transmitted diseases, partner selection, use of condoms, avoidance of unintended pregnancy  and  contraceptive alternatives.    [x]     Dental health: Discussed importance of regular tooth brushing, flossing, and dental visits.   [x]      Health maintenance and immunizations reviewed. Please refer to Health maintenance section.   Briscoe Deutscher, DO [facility]

## 2018-03-30 LAB — URINE CYTOLOGY ANCILLARY ONLY
Chlamydia: NEGATIVE
Neisseria Gonorrhea: NEGATIVE
Trichomonas: NEGATIVE

## 2018-03-30 LAB — COMPREHENSIVE METABOLIC PANEL
ALT: 11 U/L (ref 0–35)
AST: 14 U/L (ref 0–37)
Albumin: 4.6 g/dL (ref 3.5–5.2)
Alkaline Phosphatase: 38 U/L — ABNORMAL LOW (ref 39–117)
BUN: 11 mg/dL (ref 6–23)
CO2: 27 mEq/L (ref 19–32)
Calcium: 9.4 mg/dL (ref 8.4–10.5)
Chloride: 105 mEq/L (ref 96–112)
Creatinine, Ser: 0.93 mg/dL (ref 0.40–1.20)
GFR: 72.54 mL/min (ref >60.00)
Glucose, Bld: 89 mg/dL (ref 70–99)
Potassium: 3.7 mEq/L (ref 3.5–5.1)
Sodium: 138 mEq/L (ref 135–145)
Total Bilirubin: 0.7 mg/dL (ref 0.2–1.2)
Total Protein: 7.3 g/dL (ref 6.0–8.3)

## 2018-03-30 LAB — VITAMIN D 25 HYDROXY (VIT D DEFICIENCY, FRACTURES): VITD: 33.11 ng/mL (ref 30.00–100.00)

## 2018-03-30 LAB — LIPID PANEL
Cholesterol: 147 mg/dL (ref 0–200)
HDL: 66.2 mg/dL (ref >39.00)
LDL Cholesterol: 73 mg/dL (ref 0–99)
NonHDL: 80.76
Total CHOL/HDL Ratio: 2
Triglycerides: 40 mg/dL (ref 0.0–149.0)
VLDL: 8 mg/dL (ref 0.0–40.0)

## 2018-03-30 LAB — CBC WITH DIFFERENTIAL/PLATELET
Basophils Absolute: 0 10*3/uL (ref 0.0–0.1)
Basophils Relative: 0.6 % (ref 0.0–3.0)
Eosinophils Absolute: 0.1 10*3/uL (ref 0.0–0.7)
Eosinophils Relative: 1.3 % (ref 0.0–5.0)
HCT: 40.2 % (ref 36.0–46.0)
Hemoglobin: 13.4 g/dL (ref 12.0–15.0)
Lymphocytes Relative: 29.7 % (ref 12.0–46.0)
Lymphs Abs: 2.1 10*3/uL (ref 0.7–4.0)
MCHC: 33.3 g/dL (ref 30.0–36.0)
MCV: 95.7 fl (ref 78.0–100.0)
Monocytes Absolute: 0.6 10*3/uL (ref 0.1–1.0)
Monocytes Relative: 8.2 % (ref 3.0–12.0)
Neutro Abs: 4.4 10*3/uL (ref 1.4–7.7)
Neutrophils Relative %: 60.2 % (ref 43.0–77.0)
Platelets: 261 10*3/uL (ref 150.0–400.0)
RBC: 4.2 Mil/uL (ref 3.87–5.11)
RDW: 14.3 % (ref 11.5–15.5)
WBC: 7.2 10*3/uL (ref 4.0–10.5)

## 2018-03-30 LAB — TSH: TSH: 2.85 u[IU]/mL (ref 0.35–4.50)

## 2018-03-30 LAB — VITAMIN B12: Vitamin B-12: 276 pg/mL (ref 211–911)

## 2018-03-30 LAB — T4, FREE: Free T4: 0.96 ng/dL (ref 0.60–1.60)

## 2018-03-31 ENCOUNTER — Encounter: Payer: Self-pay | Admitting: Family Medicine

## 2018-04-01 ENCOUNTER — Encounter: Payer: Self-pay | Admitting: Family Medicine

## 2018-04-01 DIAGNOSIS — R202 Paresthesia of skin: Secondary | ICD-10-CM | POA: Insufficient documentation

## 2018-04-01 NOTE — Telephone Encounter (Signed)
Called patient she is not having in any sob, swelling in face or mouth. She states that it started about two days ago. Had red itchy areas that are like pimples on her chin forehead and cheeks. She has been on zyrtec and triamcinolone cream and has helped little. It is still itchy. She does not want to take benadryl due to sedation. She has not tried hydrocortisone cream or any other over the counter treatments. She does see improvement. She has not had any new medications, or environmental changes. Advised to avoid makeup harsh face washes and only use light anti bacterial soap on face like dial or dove. Informed we will send message with any suggestions that you have.

## 2018-05-18 ENCOUNTER — Encounter: Payer: Self-pay | Admitting: Allergy and Immunology

## 2018-05-18 ENCOUNTER — Ambulatory Visit (INDEPENDENT_AMBULATORY_CARE_PROVIDER_SITE_OTHER): Payer: 59 | Admitting: Allergy and Immunology

## 2018-05-18 VITALS — BP 108/68 | HR 70 | Temp 98.3°F | Resp 16 | Ht 69.0 in | Wt 195.0 lb

## 2018-05-18 DIAGNOSIS — L989 Disorder of the skin and subcutaneous tissue, unspecified: Secondary | ICD-10-CM

## 2018-05-18 DIAGNOSIS — T7840XD Allergy, unspecified, subsequent encounter: Secondary | ICD-10-CM

## 2018-05-18 DIAGNOSIS — Z880 Allergy status to penicillin: Secondary | ICD-10-CM

## 2018-05-18 DIAGNOSIS — L308 Other specified dermatitis: Secondary | ICD-10-CM | POA: Diagnosis not present

## 2018-05-18 DIAGNOSIS — T783XXD Angioneurotic edema, subsequent encounter: Secondary | ICD-10-CM | POA: Diagnosis not present

## 2018-05-18 NOTE — Patient Instructions (Addendum)
  1.  Allergen avoidance measures - PCN / Augmentin  2.  Blood -alpha gal panel  3.  Further evaluation and treatment?  Yes if recurrent reactions

## 2018-05-18 NOTE — Progress Notes (Signed)
Dear Dr. Juleen China,  Thank you for referring Tina [name] to the [facility] on 05/18/2018.   Below is a summation of this patient's evaluation and recommendations.  Thank you for your referral. I will keep you informed about this patient's response to treatment.   If you have any questions please do not hesitate to contact me.   Sincerely,  Jiles Prows, MD Allergy / Immunology [facility]   ______________________________________________________________________    NEW PATIENT NOTE  Referring Provider: Briscoe Deutscher, DO Primary Provider: Briscoe Deutscher, DO Date of office visit: 05/18/2018    Subjective:   Chief Complaint:  Tina [name] (DOB: 28-Dec-1980) is a 38 y.o. female who presents to the clinic on 05/18/2018 with a chief complaint of Allergic Reaction .     HPI: Tina [name] presents to this clinic in evaluation of several distinct issues.  First, in December 2019 she developed an episode of swollen face that was red and looked as though it had an acneiform-like eruption that developed over several hours without any associated systemic or constitutional symptoms that was treated with a topical steroid with resolution in about 5 days.  There was no obvious trigger giving rise to this issue.  This has not been a recurrent issue.  Second, in summer 2019 she developed 2 distinct episodes of lip swelling with intensely itchy lips without associated systemic or constitutional symptoms within 3 weeks of each other without any obvious trigger.  Both these episode last several days.  Third, in 2018 she was treated with Augmentin for an episode of sinusitis and within 2 days developed a red stippled pattern on her shins and thighs.  She shows me a picture today and it looked as though there was loss of integrity of capillaries.  She used a topical steroid and in approximately 10 days this  dermatitis resolved.  There was no other associated systemic or constitutional symptoms.  She believes that she has taken a cephalosporin since that event without any adverse effect.  Fourth, many years ago she was injected with lidocaine into her foot to have a wart removed and she developed a itchy burning area at that site that lasted several days without any associated symptoms systemic or constitutional symptoms or obvious dermatitis.  Past Medical History:  Diagnosis Date  . Angio-edema   . Hypothyroidism, Levothyroxine only 03/15/2017  . Mirena IUD (intrauterine device) in place 03/15/2017  . Urticaria     Past Surgical History:  Procedure Laterality Date  . NO PAST SURGERIES      Allergies as of 05/18/2018      Reactions   Augmentin [amoxicillin-pot Clavulanate] Rash   Lidocaine Itching   Per pt has had lidocaine after and was ok   Penicillins Rash      Medication List      ibuprofen 200 MG tablet Commonly known as:  ADVIL,MOTRIN Take 200 mg by mouth every 6 (six) hours as needed.   levothyroxine 125 MCG tablet Commonly known as:  SYNTHROID, LEVOTHROID Take 1 tablet (125 mcg total) by mouth daily.   MIRENA (52 MG) 20 MCG/24HR IUD Generic drug:  levonorgestrel Mirena 20 mcg/24 hr (5 years) intrauterine device  Take 1 device by intrauterine route.   prenatal multivitamin Tabs tablet Take 1 tablet by mouth daily at 12 noon.   vitamin B-12 100 MCG tablet Commonly known as:  CYANOCOBALAMIN Take 100 mcg by mouth  daily.       Review of systems negative except as noted in HPI / PMHx or noted below:  Review of Systems  Constitutional: Negative.   HENT: Negative.   Eyes: Negative.   Respiratory: Negative.   Cardiovascular: Negative.   Gastrointestinal: Negative.   Genitourinary: Negative.   Musculoskeletal: Negative.   Skin: Negative.   Neurological: Negative.   Endo/Heme/Allergies: Negative.   Psychiatric/Behavioral: Negative.     Family History    Problem Relation Age of Onset  . Arthritis Mother   . Leukemia Mother   . Other Sister        accessory pathways in heart    Social History   Socioeconomic History  . Marital status: Single    Spouse name: Not on file  . Number of children: Not on file  . Years of education: Not on file  . Highest education level: Not on file  Occupational History  . Occupation: OB/GYN    Employer: [employer]  Social Needs  . Financial resource strain: Not on file  . Food insecurity:    Worry: Not on file    Inability: Not on file  . Transportation needs:    Medical: Not on file    Non-medical: Not on file  Tobacco Use  . Smoking status: Never Smoker  . Smokeless tobacco: Never Used  Substance and Sexual Activity  . Alcohol use: Yes    Comment: Social  . Drug use: No  . Sexual activity: Yes    Partners: Male    Birth control/protection: I.U.D.  Lifestyle  . Physical activity:    Days per week: Not on file    Minutes per session: Not on file  . Stress: Not on file  Relationships  . Social connections:    Talks on phone: Not on file    Gets together: Not on file    Attends religious service: Not on file    Active member of club or organization: Not on file    Attends meetings of clubs or organizations: Not on file    Relationship status: Not on file  . Intimate partner violence:    Fear of current or ex partner: Not on file    Emotionally abused: Not on file    Physically abused: Not on file    Forced sexual activity: Not on file  Other Topics Concern  . Not on file  Social History Narrative  . Not on file    Environmental and Social history  Lives in a apartment with a dry environment, no animals located inside the household, no carpet in the bedroom, no plastic on the bed, no plastic on the pillow, and no smoking ongoing with inside the household.  She works as a Engineer, drilling in OB/GYN  Objective:   Vitals:   05/18/18 1432  BP: 108/68  Pulse: 70  Resp: 16  Temp:  98.3 F (36.8 C)  SpO2: 98%   Height: 5\' 9"  (175.3 cm) Weight: 195 lb (88.5 kg)  Physical Exam Constitutional:      Appearance: She is not diaphoretic.  HENT:     Head: Normocephalic. No right periorbital erythema or left periorbital erythema.     Right Ear: Tympanic membrane, ear canal and external ear normal.     Left Ear: Tympanic membrane, ear canal and external ear normal.     Nose: Nose normal. No mucosal edema or rhinorrhea.     Mouth/Throat:     Pharynx: No oropharyngeal exudate.  Eyes:  General: Lids are normal.     Conjunctiva/sclera: Conjunctivae normal.     Pupils: Pupils are equal, round, and reactive to light.  Neck:     Thyroid: No thyromegaly.     Trachea: Trachea normal. No tracheal deviation.  Cardiovascular:     Rate and Rhythm: Normal rate and regular rhythm.     Heart sounds: Normal heart sounds, S1 normal and S2 normal. No murmur.  Pulmonary:     Effort: Pulmonary effort is normal. No respiratory distress.     Breath sounds: No stridor. No wheezing or rales.  Chest:     Chest wall: No tenderness.  Abdominal:     General: There is no distension.     Palpations: Abdomen is soft. There is no mass.     Tenderness: There is no abdominal tenderness. There is no guarding or rebound.  Musculoskeletal:        General: No tenderness.  Lymphadenopathy:     Head:     Right side of head: No tonsillar adenopathy.     Left side of head: No tonsillar adenopathy.     Cervical: No cervical adenopathy.  Skin:    Coloration: Skin is not pale.     Findings: No erythema or rash.     Nails: There is no clubbing.   Neurological:     Mental Status: She is alert.     Diagnostics: Allergy skin tests were performed.  She did not demonstrate any hypersensitivity to a screening panel of foods.  Results of blood tests obtained 29 March 2018 identified creatinine 0.93 mg/DL, AST 14 U/L, ALT 11 U/L, WBC 7.2, eosinophil 100, lymphocyte 2100, hemoglobin 13.4, platelet  261, TSH 2.85 IU/mL, T4 0.96 NG/DL   Assessment and Plan:    1. Allergic reaction, subsequent encounter   2. Angioedema, subsequent encounter   3. Inflammatory dermatosis   4. Penicillin allergy     1.  Allergen avoidance measures - PCN / Augmentin  2.  Blood -alpha gal panel  3.  Further evaluation and treatment?  Yes if recurrent reactions  Tina [name] has had several immunologic episodes without a consistent trigger other than her hypersensitivity reaction that appeared to occur with the administration of Augmentin.  Because she has such intermittent episodes I have asked her to hold off on any further evaluation for immunological hyperactivity unless these are recurrent events and she will certainly require more evaluation for immunological hyperreactivity if indeed this does become a recurrent phenomena.  We will screen her for possible alpha gal syndrome regarding her cluster of lip swelling episodes that occurred last year.  Given the fact that she had a somewhat delayed very inflammatory dermatosis with loss of capillary integrity after using Augmentin I would not recommend that she utilize penicillin based medications in the future.  Jiles Prows, MD Allergy / Immunology [facility]

## 2018-05-19 ENCOUNTER — Encounter: Payer: Self-pay | Admitting: Allergy and Immunology

## 2018-05-21 ENCOUNTER — Ambulatory Visit (INDEPENDENT_AMBULATORY_CARE_PROVIDER_SITE_OTHER): Payer: 59 | Admitting: Internal Medicine

## 2018-05-21 DIAGNOSIS — Z7189 Other specified counseling: Secondary | ICD-10-CM

## 2018-05-21 DIAGNOSIS — Z23 Encounter for immunization: Secondary | ICD-10-CM

## 2018-05-21 DIAGNOSIS — Z7184 Encounter for health counseling related to travel: Secondary | ICD-10-CM

## 2018-05-21 DIAGNOSIS — Z9189 Other specified personal risk factors, not elsewhere classified: Secondary | ICD-10-CM

## 2018-05-21 DIAGNOSIS — Z7185 Encounter for immunization safety counseling: Secondary | ICD-10-CM

## 2018-05-21 MED ORDER — ATOVAQUONE-PROGUANIL HCL 250-100 MG PO TABS: 1.0000 | ORAL_TABLET | Freq: Every day | ORAL | 0 refills | Status: DC

## 2018-05-21 MED ORDER — AZITHROMYCIN 500 MG PO TABS: 500.0000 mg | ORAL_TABLET | Freq: Every day | ORAL | 0 refills | Status: DC

## 2018-05-21 MED ORDER — TYPHOID VACCINE PO CPDR: 1.0000 | DELAYED_RELEASE_CAPSULE | ORAL | 0 refills | Status: DC

## 2018-05-21 MED FILL — AZITHROMYCIN 500 MG TABLET: 500 | 5 days supply | Qty: 5 | Fill #0

## 2018-05-21 MED FILL — VIVOTIF EC CAPSULE: 8 days supply | Qty: 4 | Fill #0

## 2018-05-21 MED FILL — ATOVAQUONE-PROGUANIL 250-10: 250-100 | 23 days supply | Qty: 23 | Fill #0

## 2018-05-21 NOTE — Patient Instructions (Signed)
[facility]                [address]                   [place], [place] [address]                      Phone: [phone]                        Fax: [phone]   2 wks in [place]  Guidelines for the Prevention & Treatment of Traveler's Diarrhea  Prevention: "Boil it, Peel it, Cook it, or Forget it"   the fewer chances -> lower risk: try to stick to food & water precautions as much as possible"   If it's "piping hot"; it is probably okay, if not, it may not be   Treatment   1) You should always take care to drink lots of fluids in order to avoid dehydration   2) You should bring medications with you in case you come down with a case of diarrhea   3) OTC = bring pepto-bismol - can take with initial abdominal symptoms;                    Imodium - can help slow down your intestinal tract, can help relief cramps                    and diarrhea, can take if no bloody diarrhea  Use Azithro if needed for traveler's diarrhea  Guidelines for the Prevention of Malaria  Avoidance:  -fewer mosquito bites = lower risk. Mosquitos can bite at night as well as daytime  -cover up (long sleeve clothing), mosquito nets, screens  -Insect repellent for your skin ( DEET containing lotion > 20%): for clothes ( permethrin spray) www.insectshield.com 541-013-2502) for pretreated permethrin   start 2 days prior to your departure for [place], start  malarone on a full stomach daily for malaria prevention.   Immunizations received today: Hepatitis A #2- for lifelong immunity. And Oral Typhoid, which gives you 5 yrs of coverage   Prior to travel:  1) Be sure to pick up appropriate prescriptions, including medicine you take daily. Do not expect to be able to fill your prescriptions abroad.  2) Strongly consider obtaining traveler's insurance, including emergency evacuation insurance. Most plans in the [place] do not cover participants abroad.  (see below for resources)  3) Register at the appropriate [place] embassy or consulate with travel dates so they are aware of your presence in-country and for helpful advice during travel using the [employer] (STEP, GreenNylon.com.cy).  4) Leave contact information with a relative or friend.  5) Keep a Research officer, political party, credit cards in case they become lost or stolen  6) Inform your credit card company that you will be travelling abroad   During travel:  1) If you become ill and need medical advice, the [place] [employer]	[place] of the country you are traveling in general provides a list of [place] speaking doctors.  We are also available on MyChart for remote consultation if you register prior to travel. 2) Avoid motorcycles or scooters when at all possible. Traffic laws in many countries are lax and accidents occur frequently.  3) Do not take any unnecessary risks that you wouldn't do at home.   Resources:  -  Country specific information: BlindResource.ca or GreenNylon.com.cy  -Press photographer (DEET, mosquito nets): [employer], [employer] store, [employer], [place] insurance options: gatewayplans.com; http://clayton-rivera.info/; travelguard.com or [employer], gninsurance.com or info@gninsurance .com, [id number].   Post Travel:  If you return from your trip ill, call your primary care doctor or our travel clinic @ [phone].   Enjoy your trip and know that with proper pre-travel preparation, most people have an enjoyable and uninterrupted trip!

## 2018-05-24 LAB — ALPHA-GAL PANEL
Alpha Gal IgE*: <0.1 kU/L (ref <0.10)
Beef (Bos spp) IgE: 0.17 kU/L (ref <0.35)
Class Interpretation: 0
Lamb/Mutton (Ovis spp) IgE: 0.18 kU/L (ref <0.35)
Pork (Sus spp) IgE: <0.1 kU/L (ref <0.35)

## 2018-05-28 NOTE — Progress Notes (Signed)
Subjective:   Tina [name] is a 38 y.o. female who presents to the Infectious Disease clinic for travel consultation. Planned departure date: last 2 wk of july       Countries of travel: Tina [name], [place] Areas in country: rural/urban Accommodations: family home/hotels Purpose of travel: her wedding Prior travel out of [place]: [place], dr, [place], [place], [place], [place]    uptodate on vaccine Objective:   Medications: levothyroxin, ubprofen  All: augmentin    Assessment:   No contraindications to travel.none   Plan:   Pre travel vaccine = gave oral typhoid vaccine   Malaria prophylaxis= gave rx for malarone plus also mosquito bite prevention  Travelers diarrhea = gave precautions and rx for azithromycin to use if needed

## 2018-06-01 ENCOUNTER — Telehealth: Payer: Self-pay | Admitting: Family Medicine

## 2018-06-01 NOTE — Telephone Encounter (Signed)
Called patient nothing open at [facility] app made with [place] tomorrow.

## 2018-06-01 NOTE — Telephone Encounter (Signed)
Copied from [place] [id number]. Topic: Appointment Scheduling - Scheduling Inquiry for Clinic >> Jun 01, 2018  3:44 PM Judyann Munson wrote: Reason for CRM:  Patient is requesting to see Dr.Wallace  as soon as possible. She is experiencing on going pain in her  Left wrist for the past 3 days and today it has gotten worse. 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 Her  best contact number is [phone]. Please advise

## 2018-06-02 ENCOUNTER — Encounter: Payer: Self-pay | Admitting: Sports Medicine

## 2018-06-02 ENCOUNTER — Ambulatory Visit (INDEPENDENT_AMBULATORY_CARE_PROVIDER_SITE_OTHER): Payer: 59 | Admitting: Sports Medicine

## 2018-06-02 ENCOUNTER — Ambulatory Visit: Payer: 59 | Admitting: Family Medicine

## 2018-06-02 ENCOUNTER — Ambulatory Visit: Payer: Self-pay

## 2018-06-02 VITALS — BP 110/68 | HR 68 | Ht 69.0 in | Wt 191.0 lb

## 2018-06-02 DIAGNOSIS — M25532 Pain in left wrist: Secondary | ICD-10-CM

## 2018-06-02 DIAGNOSIS — M25432 Effusion, left wrist: Secondary | ICD-10-CM

## 2018-06-02 NOTE — Telephone Encounter (Signed)
Noted, Dr. Paulla Fore aware.

## 2018-06-02 NOTE — Telephone Encounter (Signed)
Called patient put on schedule for only open slot you had patient will try to be here by 215 but will be coming from dentist app.

## 2018-06-02 NOTE — Procedures (Signed)
PROCEDURE NOTE:  Ultrasound Guided: Injection: Left wrist Images were obtained and interpreted by myself, Teresa Coombs, DO  Images have been saved and stored to PACS system. Images obtained on: GE S7 Ultrasound machine    ULTRASOUND FINDINGS:  Left wrist demonstrates fairly significant synovitis a moderate joint effusion.  There is no appreciable cortical disruption although there is some spurring of the proximal and distal carpal rows.  There is marked increased neovascularity.  Her median nerve is completely normal-appearing  DESCRIPTION OF PROCEDURE:  The patient's clinical condition is marked by substantial pain and/or significant functional disability. Other conservative therapy has not provided relief, is contraindicated, or not appropriate. There is a reasonable likelihood that injection will significantly improve the patient's pain and/or functional impairment.   After discussing the risks, benefits and expected outcomes of the injection and all questions were reviewed and answered, the patient wished to undergo the above named procedure.  Verbal consent was obtained.  The ultrasound was used to identify the target structure and adjacent neurovascular structures. The skin was then prepped in sterile fashion and the target structure was injected under direct visualization using sterile technique as below:  Single injection performed as below: PREP: Alcohol and Ethel Chloride APPROACH:direct, single injection, 25g 1.5 in. INJECTATE: 1 cc 0.5% Marcaine and 1 cc 40mg /mL DepoMedrol ASPIRATE: None DRESSING: Band-Aid  Post procedural instructions including recommending icing and warning signs for infection were reviewed.    This procedure was well tolerated and there were no complications.   IMPRESSION: Succesful Ultrasound Guided: Injection

## 2018-06-02 NOTE — Patient Instructions (Signed)

## 2018-06-02 NOTE — Telephone Encounter (Signed)
I spoke with Dr. Paulla Fore. 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 Please make sure that this is coordinated between our schedules and the patient.

## 2018-06-02 NOTE — Progress Notes (Signed)
Tina [name]. Tina [name], [facility]  Tina [name] - 38 y.o. female MRN [id number]  Date of birth: 1980/09/30  Visit Date: June 02, 2018  PCP: Briscoe Deutscher, DO   Referred by: Briscoe Deutscher, DO  SUBJECTIVE:  Chief Complaint  Patient presents with  . New Patient (Initial Visit)    B wrist pain    HPI: Patient presents with 5 to 6 days of worsening left wrist pain.  She was holding a bag out and away from her body and had an acute onset of pain.  Pain is moderate to severe at this time.  Worse with wrist flexion and extension.  She has pain directly over the DRUJ and wrist joint.  Intact grip strength.  No significant numbness and tingling associated with this but she does have some bilateral fourth and fifth finger numbness is positional in nature.  Ibuprofen has been helpful but only minimally.  REVIEW OF SYSTEMS: No significant nighttime awakenings due to this issue. Denies fevers, chills, recent weight gain or weight loss.  No night sweats.  Per HPI  HISTORY:  Prior history reviewed and updated per electronic medical record.  Patient Active Problem List   Diagnosis Date Noted  . Paresthesias 04/01/2018  . Nevus of scalp 01/07/2018  . 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 Melasma 01/07/2018  . 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 Hypothyroidism 03/15/2017  . History of rectal bleeding 03/15/2017  . Epigastric pain 03/15/2017  . IUD (intrauterine device) in place 03/15/2017    Mirena.     Social History   Occupational History  . Occupation: OB/GYN    Employer: [employer]  Tobacco Use  . Smoking status: Never Smoker  . Smokeless tobacco: Never Used  Substance and Sexual Activity  . Alcohol use: Yes    Comment: Social  . Drug use: No  . Sexual activity: Yes    Partners: Male    Birth control/protection: I.U.D.   Social History   Social History Narrative  . Not on file    Past  Medical History:  Diagnosis Date  . Angio-edema   . Hypothyroidism, Levothyroxine only 03/15/2017  . Mirena IUD (intrauterine device) in place 03/15/2017  . Urticaria      Past Surgical History:  Procedure Laterality Date  . NO PAST SURGERIES      family history includes Arthritis in her mother; Leukemia in her mother; Other in her sister.  OBJECTIVE:  VS:  HT:5\' 9"  (175.3 cm)   WT:191 lb (86.6 kg)  BMI:28.19    BP:110/68  HR:68bpm  TEMP: ( )  RESP:98 %   PHYSICAL EXAM: Well-developed, Well-nourished and In no acute distress  Pupils are equal., EOM intact without nystagmus. and No scleral icterus.  Alert & appropriately interactive. and Not depressed or anxious appearing.  Warm and well perfused   Left wrist is overall well aligned.  She has a small effusion with palpation.  Pain directly over the DRUJ.  No significant anatomic snuffbox pain or ulnar-sided pain.  Grip strength is intact.  Negative carpal tunnel compression test.  Small amount of subluxation with axial load and shuck test.  Her pain is most severe with terminal flexion extension but has a well-preserved range of motion.   ASSESSMENT:   1. Arthralgia of left wrist   2. Wrist effusion, left      PROCEDURES:  US Guided Injection per procedure note  PLAN:  Pertinent additional documentation may be included in corresponding procedure notes, imaging studies, problem based documentation and patient instructions.  No problem-specific Assessment & Plan notes found for this encounter.   Patient does have fairly significant wrist effusion.  I suspect she has some underlying wrist arthritis possible carpal instability secondary to heavy use as a OB/GYN.  Intra-articular injection performed today.  We discussed if any lack of improvement further diagnostic evaluation would likely be prudent.  He does look like she has some early wrist arthritis which given her age and occupation is slightly concerning.  She  denies any significant trauma that could be indicative significant scapholunate ligament injury.  If any lack of improvement or worsening clenched fist x-ray will be obtained.   Activity modifications and the importance of avoiding exacerbating activities (limiting pain to no more than a 4 / 10 during or following activity) recommended and discussed.  Discussed red flag symptoms that warrant earlier emergent evaluation and patient voices understanding.   No orders of the defined types were placed in this encounter.  Lab Orders  No laboratory test(s) ordered today    Imaging Orders     [place] [facility] POCT ULTRASOUND Referral Orders  No referral(s) requested today   Return in about 4 weeks (around 06/30/2018), or if symptoms worsen or fail to improve.          Gerda Diss, [facility] Physician

## 2018-06-04 ENCOUNTER — Telehealth: Payer: Self-pay

## 2018-06-04 NOTE — Telephone Encounter (Signed)
Attempted to call patient to see if she has had Hep A vaccine before. Left message requesting a call back when available.  Tina [name]

## 2018-06-04 NOTE — Telephone Encounter (Signed)
-----   Message from Landis Gandy, RN sent at 06/04/2018 12:03 PM EST ----- Would you be able to contact the patient please? Thank you!!! Sharyn Lull ----- Message ----- From: Carlyle Basques, MD Sent: 05/28/2018   5:21 PM EST To: Landis Gandy, RN  Can you check with employee health if they ever check that she got hep A ?   If not can you call her to check if she got hep A.   I think I was a total space cadet and mistook hpv for hep A?

## 2018-06-24 ENCOUNTER — Other Ambulatory Visit: Payer: Self-pay | Admitting: Family Medicine

## 2018-06-24 ENCOUNTER — Encounter: Payer: Self-pay | Admitting: Family Medicine

## 2018-06-24 DIAGNOSIS — E039 Hypothyroidism, unspecified: Secondary | ICD-10-CM

## 2018-06-25 ENCOUNTER — Other Ambulatory Visit: Payer: Self-pay

## 2018-06-25 MED ORDER — LEVOTHYROXINE SODIUM 125 MCG PO TABS: 125.0000 ug | ORAL_TABLET | Freq: Every day | ORAL | 1 refills | Status: DC

## 2018-06-25 MED FILL — LEVOTHYROXINE 125 MCG TABLE: 125 | 90 days supply | Qty: 90 | Fill #0

## 2018-08-22 ENCOUNTER — Emergency Department (HOSPITAL_COMMUNITY)
Admission: EM | Admit: 2018-08-22 | Discharge: 2018-08-22 | Disposition: A | Discharge status: Home / Self Care | Payer: 59 | Attending: Emergency Medicine | Admitting: Emergency Medicine

## 2018-08-22 DIAGNOSIS — R05 Cough: Secondary | ICD-10-CM | POA: Diagnosis not present

## 2018-08-22 DIAGNOSIS — Z20828 Contact with and (suspected) exposure to other viral communicable diseases: Secondary | ICD-10-CM | POA: Diagnosis not present

## 2018-08-22 DIAGNOSIS — Z03818 Encounter for observation for suspected exposure to other biological agents ruled out: Secondary | ICD-10-CM | POA: Diagnosis not present

## 2018-08-22 DIAGNOSIS — E039 Hypothyroidism, unspecified: Secondary | ICD-10-CM | POA: Diagnosis not present

## 2018-08-22 DIAGNOSIS — R0982 Postnasal drip: Secondary | ICD-10-CM | POA: Diagnosis not present

## 2018-08-22 DIAGNOSIS — J029 Acute pharyngitis, unspecified: Secondary | ICD-10-CM | POA: Insufficient documentation

## 2018-08-22 DIAGNOSIS — Z79899 Other long term (current) drug therapy: Secondary | ICD-10-CM | POA: Diagnosis not present

## 2018-08-22 LAB — SARS CORONAVIRUS 2 BY RT PCR (HOSPITAL ORDER, PERFORMED IN ~~LOC~~ HOSPITAL LAB): SARS Coronavirus 2: NEGATIVE

## 2018-08-22 LAB — GROUP A STREP BY PCR: Group A Strep by PCR: NOT DETECTED

## 2018-08-22 NOTE — Discharge Instructions (Signed)
Return if any problems.      Person Under Monitoring Name: Tina [name]  Location: [place] [address]   Infection Prevention Recommendations for Individuals Confirmed to have, or Being Evaluated for, 2019 Novel Coronavirus (COVID-19) Infection Who Receive Care at Home  Individuals who are confirmed to have, or are being evaluated for, COVID-19 should follow the prevention steps below until a healthcare provider or local or state health department says they can return to normal activities.  Stay home except to get medical care You should restrict activities outside your home, except for getting medical care. Do not go to work, school, or public areas, and do not use public transportation or taxis.  Call ahead before visiting your doctor Before your medical appointment, call the healthcare provider and tell them that you have, or are being evaluated for, COVID-19 infection. This will help the healthcare providers office take steps to keep other people from getting infected. Ask your healthcare provider to call the local or state health department.  Monitor your symptoms Seek prompt medical attention if your illness is worsening (e.g., difficulty breathing). Before going to your medical appointment, call the healthcare provider and tell them that you have, or are being evaluated for, COVID-19 infection. Ask your healthcare provider to call the local or state health department.  Wear a facemask You should wear a facemask that covers your nose and mouth when you are in the same room with other people and when you visit a healthcare provider. People who live with or visit you should also wear a facemask while they are in the same room with you.  Separate yourself from other people in your home As much as possible, you should stay in a different room from other people in your home. Also, you should use a separate bathroom, if available.  Avoid  sharing household items You should not share dishes, drinking glasses, cups, eating utensils, towels, bedding, or other items with other people in your home. After using these items, you should wash them thoroughly with soap and water.  Cover your coughs and sneezes Cover your mouth and nose with a tissue when you cough or sneeze, or you can cough or sneeze into your sleeve. Throw used tissues in a lined trash can, and immediately wash your hands with soap and water for at least 20 seconds or use an alcohol-based hand rub.  Wash your [facility] [employer] your hands often and thoroughly with soap and water for at least 20 seconds. You can use an alcohol-based hand sanitizer if soap and water are not available and if your hands are not visibly dirty. Avoid touching your eyes, nose, and mouth with unwashed hands.   Prevention Steps for Caregivers and Household Members of Individuals Confirmed to have, or Being Evaluated for, COVID-19 Infection Being Cared for in the Home  If you live with, or provide care at home for, a person confirmed to have, or being evaluated for, COVID-19 infection please follow these guidelines to prevent infection:  Follow healthcare providers instructions Make sure that you understand and can help the patient follow any healthcare provider instructions for all care.  Provide for the patients basic needs You should help the patient with basic needs in the home and provide support for getting groceries, prescriptions, and other personal needs.  Monitor the patients symptoms If they are getting sicker, call his or her medical provider and tell them that the patient has, or is being evaluated  for, COVID-19 infection. This will help the healthcare providers office take steps to keep other people from getting infected. Ask the healthcare provider to call the local or state health department.  Limit the number of people who have contact with the patient If possible, have  only one caregiver for the patient. Other household members should stay in another home or place of residence. If this is not possible, they should stay in another room, or be separated from the patient as much as possible. Use a separate bathroom, if available. Restrict visitors who do not have an essential need to be in the home.  Keep older adults, very young children, and other sick people away from the patient Keep older adults, very young children, and those who have compromised immune systems or chronic health conditions away from the patient. This includes people with chronic heart, lung, or kidney conditions, diabetes, and cancer.  Ensure good ventilation Make sure that shared spaces in the home have good air flow, such as from an air conditioner or an opened window, weather permitting.  Wash your hands often Wash your hands often and thoroughly with soap and water for at least 20 seconds. You can use an alcohol based hand sanitizer if soap and water are not available and if your hands are not visibly dirty. Avoid touching your eyes, nose, and mouth with unwashed hands. Use disposable paper towels to dry your hands. If not available, use dedicated cloth towels and replace them when they become wet.  Wear a facemask and gloves Wear a disposable facemask at all times in the room and gloves when you touch or have contact with the patients blood, body fluids, and/or secretions or excretions, such as sweat, saliva, sputum, nasal mucus, vomit, urine, or feces.  Ensure the mask fits over your nose and mouth tightly, and do not touch it during use. Throw out disposable facemasks and gloves after using them. Do not reuse. Wash your hands immediately after removing your facemask and gloves. If your personal clothing becomes contaminated, carefully remove clothing and launder. Wash your hands after handling contaminated clothing. Place all used disposable facemasks, gloves, and other waste in a  lined container before disposing them with other household waste. Remove gloves and wash your hands immediately after handling these items.  Do not share dishes, glasses, or other household items with the patient Avoid sharing household items. You should not share dishes, drinking glasses, cups, eating utensils, towels, bedding, or other items with a patient who is confirmed to have, or being evaluated for, COVID-19 infection. After the person uses these items, you should wash them thoroughly with soap and water.  Wash laundry thoroughly Immediately remove and wash clothes or bedding that have blood, body fluids, and/or secretions or excretions, such as sweat, saliva, sputum, nasal mucus, vomit, urine, or feces, on them. Wear gloves when handling laundry from the patient. Read and follow directions on labels of laundry or clothing items and detergent. In general, wash and dry with the warmest temperatures recommended on the label.  Clean all areas the individual has used often Clean all touchable surfaces, such as counters, tabletops, doorknobs, bathroom fixtures, toilets, phones, keyboards, tablets, and bedside tables, every day. Also, clean any surfaces that may have blood, body fluids, and/or secretions or excretions on them. Wear gloves when cleaning surfaces the patient has come in contact with. Use a diluted bleach solution (e.g., dilute bleach with 1 part bleach and 10 parts water) or a household disinfectant with a  label that says EPA-registered for coronaviruses. To make a bleach solution at home, add 1 tablespoon of bleach to 1 quart (4 cups) of water. For a larger supply, add  cup of bleach to 1 gallon (16 cups) of water. Read labels of cleaning products and follow recommendations provided on product labels. Labels contain instructions for safe and effective use of the cleaning product including precautions you should take when applying the product, such as wearing gloves or eye  protection and making sure you have good ventilation during use of the product. Remove gloves and wash hands immediately after cleaning.  Monitor yourself for signs and symptoms of illness Caregivers and household members are considered close contacts, should monitor their health, and will be asked to limit movement outside of the home to the extent possible. Follow the monitoring steps for close contacts listed on the symptom monitoring form.   ? If you have additional questions, contact your local health department or call the epidemiologist on call at [phone] (available 24/7). ? This guidance is subject to change. For the most up-to-date guidance from [facility], please refer to their website: YouBlogs.pl

## 2018-08-22 NOTE — ED Notes (Signed)
Reviewed discharge instructions with patient, who verbalized understanding and denies further needs or questions. Plans to give pharmacy information to EDP during call back if needed. VSS, patient ambulatory with steady gait.

## 2018-08-22 NOTE — ED Triage Notes (Signed)
Patient reports sudden onset of sore throat, postnasal drip yesterday - works at [employer]. Denies fevers/chills. Sent here for test to go back to work.

## 2018-08-22 NOTE — ED Provider Notes (Signed)
[facility] EMERGENCY DEPARTMENT Provider Note   CSN: [id number] Arrival date & time: 08/22/18  1154    History   Chief Complaint No chief complaint on file.   HPI Tina [name] is a 38 y.o. female.     The history is provided by the patient. No language interpreter was used.  Sore Throat  This is a new problem. The current episode started yesterday. The problem occurs constantly. The problem has been gradually worsening. Pertinent negatives include no headaches. Nothing aggravates the symptoms. Nothing relieves the symptoms. She has tried nothing for the symptoms. The treatment provided no relief.   Pt complains of  A cough and post nasal drainage.  Pt also has a sore throat.  Pt is concerned about covid 19.  Pt is an OB/gyn with Teaching service.   Past Medical History:  Diagnosis Date  . Angio-edema   . Hypothyroidism, Levothyroxine only 03/15/2017  . Mirena IUD (intrauterine device) in place 03/15/2017  . Urticaria     Patient Active Problem List   Diagnosis Date Noted  . Paresthesias 04/01/2018  . Nevus of scalp 01/07/2018  . 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 Melasma 01/07/2018  . 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 Hypothyroidism 03/15/2017  . History of rectal bleeding 03/15/2017  . Epigastric pain 03/15/2017  . IUD (intrauterine device) in place 03/15/2017    Past Surgical History:  Procedure Laterality Date  . NO PAST SURGERIES       OB History   No obstetric history on file.      Home Medications    Prior to Admission medications   Medication Sig Start Date End Date Taking? Authorizing Provider  atovaquone-proguanil (MALARONE) 250-100 MG TABS tablet Take 1 tablet by mouth daily. Start 2 days prior to trip, take daily on full stomach until complete Patient not taking: Reported on 06/02/2018 05/21/18   Carlyle Basques, MD  azithromycin (ZITHROMAX) 500 MG tablet Take 1 tablet (500 mg total) by mouth daily. If you have  3-4 watery stools/24hr. Can stop taking if diarrhea improves Patient not taking: Reported on 06/02/2018 05/21/18   Carlyle Basques, MD  ibuprofen (ADVIL,MOTRIN) 200 MG tablet Take 200 mg by mouth every 6 (six) hours as needed.    [provider]  levothyroxine (SYNTHROID, LEVOTHROID) 125 MCG tablet Take 1 tablet (125 mcg total) by mouth daily. 06/25/18   Marin Olp, MD  Prenatal Vit-Fe Fumarate-FA (PRENATAL MULTIVITAMIN) TABS tablet Take 1 tablet by mouth daily at 12 noon.    [provider]  vitamin B-12 (CYANOCOBALAMIN) 100 MCG tablet Take 100 mcg by mouth daily.    [provider]    Family History Family History  Problem Relation Age of Onset  . Arthritis Mother   . Leukemia Mother   . Other Sister        accessory pathways in heart    Social History Social History   Tobacco Use  . Smoking status: Never Smoker  . Smokeless tobacco: Never Used  Substance Use Topics  . Alcohol use: Yes    Comment: Social  . Drug use: No     Allergies   Augmentin [amoxicillin-pot clavulanate] and Penicillins   Review of Systems Review of Systems  HENT: Positive for sore throat.   Respiratory: Positive for cough.   Neurological: Negative for headaches.  All other systems reviewed and are negative.    Physical Exam Updated Vital Signs BP 120/76 (BP Location: Right Arm)  Pulse 75   Resp 16   Ht 5\' 9"  (1.753 m)   Wt 86.2 kg   SpO2 100%   BMI 28.06 kg/m   Physical Exam Vitals signs reviewed.  HENT:     Head: Normocephalic.     Right Ear: Tympanic membrane normal.     Left Ear: Tympanic membrane normal.     Mouth/Throat:     Mouth: Mucous membranes are moist.     Pharynx: Posterior oropharyngeal erythema present.  Eyes:     Conjunctiva/sclera: Conjunctivae normal.  Neck:     Musculoskeletal: Normal range of motion.  Cardiovascular:     Rate and Rhythm: Normal rate.     Heart sounds: Normal heart sounds.  Pulmonary:     Effort: Pulmonary  effort is normal.  Abdominal:     Palpations: Abdomen is soft.  Skin:    General: Skin is warm.  Neurological:     General: No focal deficit present.     Mental Status: She is alert.      ED Treatments / Results  Labs (all labs ordered are listed, but only abnormal results are displayed) Labs Reviewed  GROUP A STREP BY PCR  SARS CORONAVIRUS 2 (HOSPITAL ORDER, [facility])    EKG None  Radiology No results found.  Procedures Procedures (including critical care time)  Medications Ordered in ED Medications - No data to display   Initial Impression / Assessment and Plan / ED Course  I have reviewed the triage vital signs and the nursing notes.  Pertinent labs & imaging results that were available during my care of the patient were reviewed by me and considered in my medical decision making (see chart for details).        MDM  covid 2 hour and strep ordered [phone] covid and strep are negative.  Pt called and given results.   Final Clinical Impressions(s) / ED Diagnoses   Final diagnoses:  Pharyngitis, unspecified etiology    ED Discharge Orders    None    An After Visit Summary was printed and given to the patient.    Fransico Meadow, PA-C 08/22/18 [place], MD 08/30/18 [phone]

## 2018-09-26 MED FILL — LEVOTHYROXINE 125 MCG TABLE: 125 | 90 days supply | Qty: 90 | Fill #1

## 2018-10-19 ENCOUNTER — Other Ambulatory Visit: Payer: Self-pay | Admitting: General Practice

## 2018-10-19 DIAGNOSIS — Z3401 Encounter for supervision of normal first pregnancy, first trimester: Secondary | ICD-10-CM

## 2018-10-29 ENCOUNTER — Ambulatory Visit (HOSPITAL_COMMUNITY): Payer: 59

## 2018-10-29 ENCOUNTER — Encounter (HOSPITAL_COMMUNITY): Payer: Self-pay

## 2018-10-30 ENCOUNTER — Other Ambulatory Visit: Payer: Self-pay

## 2018-10-30 ENCOUNTER — Ambulatory Visit (HOSPITAL_COMMUNITY)
Admission: RE | Admit: 2018-10-30 | Discharge: 2018-10-30 | Disposition: A | Discharge status: Home / Self Care | Payer: 59 | Source: Ambulatory Visit | Attending: Obstetrics and Gynecology | Admitting: Obstetrics and Gynecology

## 2018-10-30 ENCOUNTER — Other Ambulatory Visit: Payer: Self-pay | Admitting: Obstetrics & Gynecology

## 2018-10-30 DIAGNOSIS — Z3401 Encounter for supervision of normal first pregnancy, first trimester: Secondary | ICD-10-CM | POA: Diagnosis not present

## 2018-10-30 DIAGNOSIS — Z3A08 8 weeks gestation of pregnancy: Secondary | ICD-10-CM | POA: Diagnosis not present

## 2018-10-30 DIAGNOSIS — O30031 Twin pregnancy, monochorionic/diamniotic, first trimester: Secondary | ICD-10-CM | POA: Diagnosis not present

## 2018-10-30 DIAGNOSIS — O09511 Supervision of elderly primigravida, first trimester: Secondary | ICD-10-CM

## 2018-10-30 DIAGNOSIS — O99281 Endocrine, nutritional and metabolic diseases complicating pregnancy, first trimester: Secondary | ICD-10-CM

## 2018-10-30 DIAGNOSIS — O3680X0 Pregnancy with inconclusive fetal viability, not applicable or unspecified: Secondary | ICD-10-CM

## 2018-10-30 DIAGNOSIS — E039 Hypothyroidism, unspecified: Secondary | ICD-10-CM | POA: Diagnosis not present

## 2018-11-01 ENCOUNTER — Ambulatory Visit (INDEPENDENT_AMBULATORY_CARE_PROVIDER_SITE_OTHER): Payer: 59 | Admitting: Obstetrics & Gynecology

## 2018-11-01 ENCOUNTER — Encounter: Payer: Self-pay | Admitting: Obstetrics & Gynecology

## 2018-11-01 ENCOUNTER — Other Ambulatory Visit: Payer: Self-pay

## 2018-11-01 VITALS — BP 105/72 | HR 63 | Wt 195.1 lb

## 2018-11-01 DIAGNOSIS — O30019 Twin pregnancy, monochorionic/monoamniotic, unspecified trimester: Secondary | ICD-10-CM | POA: Insufficient documentation

## 2018-11-01 DIAGNOSIS — Z124 Encounter for screening for malignant neoplasm of cervix: Secondary | ICD-10-CM

## 2018-11-01 DIAGNOSIS — O09519 Supervision of elderly primigravida, unspecified trimester: Secondary | ICD-10-CM | POA: Insufficient documentation

## 2018-11-01 DIAGNOSIS — Z1151 Encounter for screening for human papillomavirus (HPV): Secondary | ICD-10-CM | POA: Diagnosis not present

## 2018-11-01 DIAGNOSIS — O0990 Supervision of high risk pregnancy, unspecified, unspecified trimester: Secondary | ICD-10-CM | POA: Diagnosis not present

## 2018-11-01 DIAGNOSIS — Z3400 Encounter for supervision of normal first pregnancy, unspecified trimester: Secondary | ICD-10-CM

## 2018-11-01 DIAGNOSIS — O30011 Twin pregnancy, monochorionic/monoamniotic, first trimester: Secondary | ICD-10-CM

## 2018-11-01 DIAGNOSIS — Z113 Encounter for screening for infections with a predominantly sexual mode of transmission: Secondary | ICD-10-CM | POA: Diagnosis not present

## 2018-11-01 DIAGNOSIS — O358XX0 Maternal care for other (suspected) fetal abnormality and damage, not applicable or unspecified: Secondary | ICD-10-CM

## 2018-11-01 DIAGNOSIS — Z31430 Encounter of female for testing for genetic disease carrier status for procreative management: Secondary | ICD-10-CM | POA: Diagnosis not present

## 2018-11-01 DIAGNOSIS — O09511 Supervision of elderly primigravida, first trimester: Secondary | ICD-10-CM

## 2018-11-01 DIAGNOSIS — Z3A08 8 weeks gestation of pregnancy: Secondary | ICD-10-CM

## 2018-11-01 DIAGNOSIS — O0991 Supervision of high risk pregnancy, unspecified, first trimester: Secondary | ICD-10-CM

## 2018-11-01 LAB — POCT URINALYSIS DIPSTICK OB
Leukocytes, UA: NEGATIVE
Nitrite, UA: NEGATIVE
POC,PROTEIN,UA: NEGATIVE
Spec Grav, UA: 1.015 (ref 1.010–1.025)

## 2018-11-01 NOTE — Progress Notes (Signed)
Subjective:    Tina [name] G1P0 LMP 5[birth year] is being seen today for her first obstetrical visit.  This is a planned pregnancy. She is at [redacted]w[redacted]d gestation. Her obstetrical history is significant for advanced maternal age and Hypothyroidism . Relationship with FOB: spouse, living together. Patient does intend to breast feed. Pregnancy history fully reviewed.  Patient reports doing well. Has spent lots of time reading on current diagnosis but, feels hopeful. .  Review of Systems:   Review of Systems  Objective:     BP 105/72   Pulse 63   Wt 195 lb 1.3 oz (88.5 kg)   LMP 09/03/2018   Breastfeeding Unknown   BMI 28.81 kg/m  Physical Exam  Exam  General Appearance:    Alert, cooperative, no distress, appears stated age  Head:    Normocephalic, without obvious abnormality, atraumatic  Eyes:    conjunctiva/corneas clear, EOM's intact, both eyes  Ears:    Normal external ear canals, both ears  Nose:   Nares normal, septum midline, mucosa normal, no drainage    or sinus tenderness  Throat:   Lips, mucosa, and tongue normal; teeth and gums normal  Neck:   Supple, symmetrical, trachea midline, no adenopathy;    thyroid:  no enlargement/tenderness/nodules  Back:     Symmetric, no curvature, ROM normal, no CVA tenderness  Lungs:     Clear to auscultation bilaterally, respirations unlabored  Chest Wall:    No tenderness or deformity   Heart:    Regular rate and rhythm  Breast Exam:    No tenderness, masses, or nipple abnormality  Abdomen:     Soft, non-tender, bowel sounds active all four quadrants,    no masses, no organomegaly  Genitalia:    Normal female without lesion, discharge or tenderness; 10 weeks sized uterus.       Extremities:   Extremities normal, atraumatic, no cyanosis or edema  Pulses:   2+ and symmetric all extremities  Skin:   Skin color, texture, turgor normal, no rashes or lesions      10/30/2018 Impression  Dr. Rosana Hoes, G1 P0 at 8w 1d gestation, is here for  early  pregnancy confirmation. This is a natural conception.  We performed transabdominal and transvaginal ultrasound to  better-evaluate the pregnancy.  Monochorionic-diamniotic twin pregnancy was confirmed.  A thin-dividing membrane was seen.  Twin A: Lower fetus. The [place] measurement is consistent  with her LMP dates and good fetal heart activity is seen.  Twin B: No clearly-identifiable cranium or limbs or fetal heart  are seen. A rudimendary amorphous structure is seen. Color  Doppler showed a feeding vessel going into the fetus is seen.  The feeder vessel is from twin A ("pump twin"). The longest  measurement is 13 millimeters.  Findings are consistent with monochorionic-diamniotic twin  pregnancy with twin reversed arterial perfusion (TRAP)  sequence.  We discussed some of the following:  -Diagnosis is made at a very early gestational age. Some of  these can result in "vanishing twin" with good outcomes in the  surviving twin.  -Follow-up ultrasound in 3 weeks may identify persistence of  TRAP sequence or "vanishing twin."  -Early diagnosis may not always be accurate.  -In established TRAP sequence, management, intervention  or expectant management is chosen. Intervention by cord  ligation or ablation (laser or radiofrequency ablation),  performed at specialized centers, carry a better prognosis  (survival of co-twin) than when expectant management is  chosen. Survival rates are over 80%.  -  Most published reports choose intervention after 16 or 18  weeks. Early intervention after 12 weeks has been  mentioned. Case reports are too few.  -Overall, I informed that no intervention exists before 12  weeks (possibly not before 16 or 18 weeks).  -I do not want to comment on prognosis and survival of the  normal twin at this gestational age by calculating estimated  fetal weights that can be inaccurate.  She has plans to go to [place] to take care of her  mother for a month  now. I do not see any likelihood of  intervention before 13 weeks. We will explore if she can have  another ultrasound and opinion in [place] which has  experience in the management of TRAP sequence. ---------------------------------------------------------------------- Recommendations  -Follow-up scan in 3 to 4 weeks.  -A limited scan may be performed next week before she goes  to [place]. Assessment:    Pregnancy: G1P0 Patient Active Problem List   Diagnosis Date Noted  . Supervision of high risk pregnancy, antepartum 11/01/2018  . Advanced maternal age, primigravida, antepartum 11/01/2018  . Twin reversed-arterial perfusion (TRAP) sequence pregnancy, antepartum 11/01/2018  . Paresthesias 04/01/2018  . Nevus of scalp 01/07/2018  . 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 Melasma 01/07/2018  . 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 Hypothyroid in pregnancy, antepartum 03/15/2017  . History of rectal bleeding 03/15/2017   discussed the [place] as above. Pt is scheduled to be in [place] in the early 2nd trimester to be with her mother. There is a center there that does the ablation at [facility]. We will forward her records there for consideration.     Plan:     Initial labs drawn. Prenatal vitamins. Problem list reviewed and updated. Genetic testing- it is early but, we will send the NIPT today may need to repeat if inadequate percentage of cells.   Role of ultrasound in pregnancy discussed; fetal survey: requested. Amniocentesis discussed: may have prior to ablation if a candidate. . Follow up in 4 weeks. Records to be sent to [facility], [place]     60% of 40 min visit spent on counseling and coordination of care.    [name] 11/01/2018

## 2018-11-03 LAB — CYTOLOGY - PAP
Chlamydia: NEGATIVE
Diagnosis: NEGATIVE
HPV: NOT DETECTED
Neisseria Gonorrhea: NEGATIVE

## 2018-11-03 LAB — OBSTETRIC PANEL, INCLUDING HIV
Antibody Screen: NEGATIVE
Basophils Absolute: 0 10*3/uL (ref 0.0–0.2)
Basos: 0 %
EOS (ABSOLUTE): 0 10*3/uL (ref 0.0–0.4)
Eos: 0 %
HIV Screen 4th Generation wRfx: NONREACTIVE
Hematocrit: 37.3 % (ref 34.0–46.6)
Hemoglobin: 12.6 g/dL (ref 11.1–15.9)
Hepatitis B Surface Ag: NEGATIVE
Immature Grans (Abs): 0 10*3/uL (ref 0.0–0.1)
Immature Granulocytes: 0 %
Lymphocytes Absolute: 2 10*3/uL (ref 0.7–3.1)
Lymphs: 26 %
MCH: 31.3 pg (ref 26.6–33.0)
MCHC: 33.8 g/dL (ref 31.5–35.7)
MCV: 93 fL (ref 79–97)
Monocytes Absolute: 0.5 10*3/uL (ref 0.1–0.9)
Monocytes: 7 %
Neutrophils Absolute: 5.2 10*3/uL (ref 1.4–7.0)
Neutrophils: 67 %
Platelets: 259 10*3/uL (ref 150–450)
RBC: 4.02 x10E6/uL (ref 3.77–5.28)
RDW: 13.9 % (ref 11.7–15.4)
RPR Ser Ql: NONREACTIVE
Rh Factor: POSITIVE
Rubella Antibodies, IGG: 9.54 index (ref >0.99)
WBC: 7.7 10*3/uL (ref 3.4–10.8)

## 2018-11-03 LAB — URINE CULTURE: Organism ID, Bacteria: NO GROWTH

## 2018-11-03 LAB — TSH: TSH: 3.25 u[IU]/mL (ref 0.450–4.500)

## 2018-11-04 ENCOUNTER — Other Ambulatory Visit: Payer: Self-pay | Admitting: Obstetrics & Gynecology

## 2018-11-04 ENCOUNTER — Other Ambulatory Visit (HOSPITAL_COMMUNITY): Payer: Self-pay | Admitting: Obstetrics & Gynecology

## 2018-11-04 DIAGNOSIS — O219 Vomiting of pregnancy, unspecified: Secondary | ICD-10-CM

## 2018-11-04 DIAGNOSIS — O3680X2 Pregnancy with inconclusive fetal viability, fetus 2: Secondary | ICD-10-CM

## 2018-11-04 DIAGNOSIS — Z363 Encounter for antenatal screening for malformations: Secondary | ICD-10-CM

## 2018-11-04 MED ORDER — DOXYLAMINE-PYRIDOXINE 10-10 MG PO TBEC: 2.0000 | DELAYED_RELEASE_TABLET | Freq: Every day | ORAL | 5 refills | Status: DC

## 2018-11-05 ENCOUNTER — Other Ambulatory Visit: Payer: Self-pay

## 2018-11-05 DIAGNOSIS — O219 Vomiting of pregnancy, unspecified: Secondary | ICD-10-CM

## 2018-11-05 MED ORDER — DOXYLAMINE-PYRIDOXINE 10-10 MG PO TBEC: 2.0000 | DELAYED_RELEASE_TABLET | Freq: Every day | ORAL | 5 refills | Status: DC

## 2018-11-08 ENCOUNTER — Ambulatory Visit (HOSPITAL_COMMUNITY)
Admission: RE | Admit: 2018-11-08 | Discharge: 2018-11-08 | Disposition: A | Discharge status: Home / Self Care | Payer: 59 | Source: Ambulatory Visit | Attending: Family Medicine | Admitting: Family Medicine

## 2018-11-08 ENCOUNTER — Other Ambulatory Visit (HOSPITAL_COMMUNITY): Payer: Self-pay | Admitting: Obstetrics & Gynecology

## 2018-11-08 ENCOUNTER — Telehealth: Payer: Self-pay

## 2018-11-08 ENCOUNTER — Encounter: Payer: 59 | Admitting: Obstetrics & Gynecology

## 2018-11-08 ENCOUNTER — Other Ambulatory Visit: Payer: Self-pay

## 2018-11-08 DIAGNOSIS — O3680X0 Pregnancy with inconclusive fetal viability, not applicable or unspecified: Secondary | ICD-10-CM

## 2018-11-08 DIAGNOSIS — O3680X2 Pregnancy with inconclusive fetal viability, fetus 2: Secondary | ICD-10-CM | POA: Insufficient documentation

## 2018-11-08 DIAGNOSIS — Z3A09 9 weeks gestation of pregnancy: Secondary | ICD-10-CM

## 2018-11-08 DIAGNOSIS — O99281 Endocrine, nutritional and metabolic diseases complicating pregnancy, first trimester: Secondary | ICD-10-CM | POA: Diagnosis not present

## 2018-11-08 DIAGNOSIS — O289 Unspecified abnormal findings on antenatal screening of mother: Secondary | ICD-10-CM

## 2018-11-08 DIAGNOSIS — O30031 Twin pregnancy, monochorionic/diamniotic, first trimester: Secondary | ICD-10-CM

## 2018-11-08 DIAGNOSIS — O09511 Supervision of elderly primigravida, first trimester: Secondary | ICD-10-CM

## 2018-11-08 DIAGNOSIS — E039 Hypothyroidism, unspecified: Secondary | ICD-10-CM

## 2018-11-08 NOTE — Telephone Encounter (Signed)
[facility] called and said that they would not be able to run the samples = too early of gestation (8.6 weeks).  Kathrene Alu RN

## 2018-11-12 DIAGNOSIS — O30011 Twin pregnancy, monochorionic/monoamniotic, first trimester: Secondary | ICD-10-CM | POA: Diagnosis not present

## 2018-11-12 DIAGNOSIS — O358XX0 Maternal care for other (suspected) fetal abnormality and damage, not applicable or unspecified: Secondary | ICD-10-CM | POA: Diagnosis not present

## 2018-11-12 DIAGNOSIS — O09511 Supervision of elderly primigravida, first trimester: Secondary | ICD-10-CM | POA: Diagnosis not present

## 2018-11-23 DIAGNOSIS — O0990 Supervision of high risk pregnancy, unspecified, unspecified trimester: Secondary | ICD-10-CM

## 2018-11-26 ENCOUNTER — Ambulatory Visit (INDEPENDENT_AMBULATORY_CARE_PROVIDER_SITE_OTHER): Payer: 59 | Admitting: Obstetrics & Gynecology

## 2018-11-26 ENCOUNTER — Encounter: Payer: Self-pay | Admitting: Obstetrics & Gynecology

## 2018-11-26 DIAGNOSIS — O358XX2 Maternal care for other (suspected) fetal abnormality and damage, fetus 2: Secondary | ICD-10-CM

## 2018-11-26 DIAGNOSIS — E039 Hypothyroidism, unspecified: Secondary | ICD-10-CM

## 2018-11-26 DIAGNOSIS — O99281 Endocrine, nutritional and metabolic diseases complicating pregnancy, first trimester: Secondary | ICD-10-CM

## 2018-11-26 DIAGNOSIS — O0990 Supervision of high risk pregnancy, unspecified, unspecified trimester: Secondary | ICD-10-CM

## 2018-11-26 DIAGNOSIS — O09511 Supervision of elderly primigravida, first trimester: Secondary | ICD-10-CM

## 2018-11-26 DIAGNOSIS — Z3A12 12 weeks gestation of pregnancy: Secondary | ICD-10-CM

## 2018-11-26 DIAGNOSIS — O0991 Supervision of high risk pregnancy, unspecified, first trimester: Secondary | ICD-10-CM

## 2018-11-26 DIAGNOSIS — O99280 Endocrine, nutritional and metabolic diseases complicating pregnancy, unspecified trimester: Secondary | ICD-10-CM

## 2018-11-26 DIAGNOSIS — O09519 Supervision of elderly primigravida, unspecified trimester: Secondary | ICD-10-CM

## 2018-11-26 NOTE — Progress Notes (Signed)
[place] VIRTUAL VIDEO VISIT ENCOUNTER NOTE  Provider location: [facility] connected with Tina [name] on 11/26/18 at  8:15 AM EDT by WebEx Video Encounter at home and verified that I am speaking with the correct person using two identifiers.   I discussed the limitations, risks, security and privacy concerns of performing an evaluation and management service virtually and the availability of in person appointments. I also discussed with the patient that there may be a patient responsible charge related to this service. The patient expressed understanding and agreed to proceed. Subjective:  Tina [name] is a 38 y.o. G1P0 at [redacted]w[redacted]d being seen today for ongoing prenatal care.  She is currently monitored for the following issues for this high-risk pregnancy and has Hypothyroid in pregnancy, antepartum; History of rectal bleeding; Nevus of scalp; Neoplasm of uncertain behavior of skin; Melasma; Nevus spilus; Paresthesias; Supervision of high risk pregnancy, antepartum; Advanced maternal age, primigravida, antepartum; and Twin reversed-arterial perfusion (TRAP) sequence pregnancy, antepartum on their problem list.  Patient reports no complaints. Her nausea is imporved.   Contractions: Not present. Vag. Bleeding: None.   . Denies any leaking of fluid.   The following portions of the patient's history were reviewed and updated as appropriate: allergies, current medications, past family history, past medical history, past social history, past surgical history and problem list.   Objective:  There were no vitals filed for this visit.  Fetal Status:           General:  Alert, oriented and cooperative. Patient is in no acute distress.  Respiratory: Normal respiratory effort, no problems with respiration noted  Mental Status: Normal mood and affect. Normal behavior. Normal judgment and thought content.  Rest of physical exam  deferred due to type of encounter  Imaging: [place] Mfm Ob Transvaginal  Result Date: 11/08/2018 ----------------------------------------------------------------------  OBSTETRICS REPORT                       (Signed Final 11/08/2018 01:31 pm) ---------------------------------------------------------------------- Patient Info  ID #:       [id number]                          D.O.B.:  Jun 14, 1980 (38 yrs)  Name:       Tina [name]               Visit Date: 11/08/2018 07:40 am ---------------------------------------------------------------------- Performed By  Performed By:     Rodrigo Ran BS      Ref. Address:     [place], [place]  [address]  Attending:        Tama High MD        [facility]  Referred By:      Osborne Oman MD ---------------------------------------------------------------------- Orders   #  Description                          Code         Ordered By   1  [place] MFM OB TRANSVAGINAL               51884.1      Verita Schneiders  ----------------------------------------------------------------------   #  Order #                    Accession #                 Episode #   1  [id number]                  [id number]                  [id number]  ---------------------------------------------------------------------- Indications   Advanced maternal age primigravida 36+,        O49.511   first trimester   Hypothyroid                                    O99.280 E03.9   Pregnancy with inconclusive fetal viability    O36.80X0   Twin pregnancy, mono/di, first trimester       O30.031   Abnormal fetal ultrasound (TRAP)               O28.9   [redacted] weeks gestation of pregnancy                  Z3A.09  ---------------------------------------------------------------------- Fetal Evaluation (Fetus A)  Num Of Fetuses:         2  Preg. Location:         Intrauterine  Gest. Sac:              Intrauterine  Fetal Pole:             Visualized  Fetal Heart Rate(bpm):  176  Cardiac Activity:       Observed  Membrane Desc:      Dividing Membrane seen - Monochorionic  Amniotic Fluid  AFI FV:      Within normal limits ---------------------------------------------------------------------- Biometry (Fetus A)  CRL:      29.2  mm     G. Age:  9w 4d                   EDD:   06/09/19 ---------------------------------------------------------------------- OB History  Gravidity:    1         Term:   0        Prem:  0        SAB:   0  TOP:          0       Ectopic:  0        Living: 0 ---------------------------------------------------------------------- Gestational Age (Fetus A)  LMP:           9w 3d         Date:  09/03/18                 EDD:   06/10/19  Best:          Donzetta Starch 3d      Det. By:  LMP  (09/03/18)          EDD:   06/10/19 ---------------------------------------------------------------------- Fetal Evaluation (Fetus B)  Num Of Fetuses:         2  Preg. Location:         Intrauterine  Gest. Sac:              Intrauterine  Fetal Pole:             Visualized  Cardiac Activity:       Absent  Membrane Desc:      Dividing Membrane seen - Monochorionic ---------------------------------------------------------------------- Gestational Age (Fetus B)  LMP:           9w 3d         Date:  09/03/18                 EDD:   06/10/19  Best:          Donzetta Starch 3d      Det. By:  LMP  (09/03/18)          EDD:   06/10/19 ---------------------------------------------------------------------- Cervix Uterus Adnexa  Cervix  Closed  Uterus  No abnormality visualized.  Left Ovary  Not visualized.  Right Ovary  Within normal limits.  Cul De Sac  No free fluid seen.  Adnexa  No abnormality visualized.  ---------------------------------------------------------------------- Impression  Monochorionic-diamniotic twin pregnancy with twin reversed  arterial perfusion (TRAP) sequence.  Patient returned for a limited evaluation to check for the  persistence of TRAP sequence. We performed both  transabdominal and transvaginal ultrasound to better-assess  her pregnancy.  Twin A: The CRL measurement is consistent with her  previously-establshed dates and good fetal heart activity is  seen.  Twin B: An amorphous rudimentary tissue consistent with  acardiac twin is seen. No cranial or cardiac structures are  identified.  A thin-dividing membrane is seen. Color and pulsed-Doppler  flow showed arterial perfusion leading into twin B is seen.  Vessel appears to originate from the chorionic plate.  Although I could not detect direct communication from Twin  A, it does not rule out its possibility.  Patient will be going to [place] to be with her mother  who is undergoing treatment. She has an appointment with  Dr. Chrisandra Netters (12/07/18) to evaluate the pregnancy. If  TRAP persists, I expect her to be counseled on early or late  intervention. ---------------------------------------------------------------------- Recommendations  -Follow-up appointments will be made after her visit to Dr.  Cy Blamer office. ----------------------------------------------------------------------                  Tama High, MD Electronically Signed Final Report   11/08/2018 01:31 pm ----------------------------------------------------------------------  [place] Mfm Ob Transvaginal  Result Date: 10/30/2018 ----------------------------------------------------------------------  OBSTETRICS REPORT                       (  Signed Final 10/30/2018 12:19 pm) ---------------------------------------------------------------------- Patient Info  ID #:       [id number]                          D.O.B.:  03/10/1981 (38 yrs)  Name:       YENIFER SACCENTE Rocco                Visit Date: 10/30/2018 08:26 am ---------------------------------------------------------------------- Performed By  Performed By:     Rodrigo Ran BS      Ref. Address:     [address]                    [place]                                                             [place], [place]  Attending:        Tama High MD        [facility]  Referred By:      Osborne Oman MD ---------------------------------------------------------------------- Orders   #  Description                          Code         Ordered By   1  [place] MFM OB TRANSVAGINAL               97026.3      Verita Schneiders   2  [place] MFM OB LIMITED                    78588.50     Verita Schneiders  ----------------------------------------------------------------------   #  Order #                    Accession #                 Episode #   1  [id number]  [id number]                  [id number]   2  [id number]                  [id number]                  [id number]  ---------------------------------------------------------------------- Indications   Advanced maternal age primigravida 13+,        O20.511   first trimester   Hypothyroid                                    O99.280 E03.9   Pregnancy with inconclusive fetal viability    O36.80X0   Twin pregnancy, mono/di, first trimester       O30.031   [redacted] weeks gestation of pregnancy                 Z3A.08  ---------------------------------------------------------------------- Fetal Evaluation (Fetus A)  Num Of Fetuses:         2  Preg. Location:         Intrauterine  Gest. Sac:              Intrauterine  Yolk Sac:               Visualized  Fetal Pole:             Visualized  Fetal Heart Rate(bpm):  177  Cardiac Activity:       Observed  ---------------------------------------------------------------------- Biometry (Fetus A)  CRL:      21.1  mm     G. Age:  8w 4d                   EDD:   06/07/19 ---------------------------------------------------------------------- OB History  Gravidity:    1         Term:   0        Prem:   0        SAB:   0  TOP:          0       Ectopic:  0        Living: 0 ---------------------------------------------------------------------- Gestational Age (Fetus A)  LMP:           8w 1d         Date:  09/03/18                 EDD:   06/10/19  Best:          8w 1d      Det. By:  LMP  (09/03/18)          EDD:   06/10/19 ---------------------------------------------------------------------- Fetal Evaluation (Fetus B)  Num Of Fetuses:         2  Preg. Location:         Intrauterine  Gest. Sac:              Intrauterine  Yolk Sac:               Visualized  Fetal Pole:             Visualized  Cardiac Activity:       Absent; see comments ---------------------------------------------------------------------- Biometry (Fetus B)  CRL:        13  mm     G. Age:  7w 4d  EDD:   06/14/19 ---------------------------------------------------------------------- Gestational Age (Fetus B)  LMP:           8w 1d         Date:  09/03/18                 EDD:   06/10/19  Best:          8w 1d      Det. By:  LMP  (09/03/18)          EDD:   06/10/19 ---------------------------------------------------------------------- Cervix Uterus Adnexa  Cervix  Closed  Uterus  No abnormality visualized.  Left Ovary  Within normal limits.  Right Ovary  Within normal limits; corpus luteum seen  Cul De Sac  No free fluid seen.  Adnexa  No abnormality visualized. ---------------------------------------------------------------------- Impression  Dr. Rosana Hoes, G1 P0 at 8w 1d gestation, is here for early  pregnancy confirmation. This is a natural conception.  We performed transabdominal and transvaginal ultrasound to  better-evaluate the pregnancy.   Monochorionic-diamniotic twin pregnancy was confirmed.  A thin-dividing membrane was seen.  Twin A: Lower fetus. The [place] measurement is consistent  with her LMP dates and good fetal heart activity is seen.  Twin B: No clearly-identifiable cranium or limbs or fetal heart  are seen. A rudimendary amorphous structure is seen. Color  Doppler showed a feeding vessel going into the fetus is seen.  The feeder vessel is from twin A ("pump twin"). The longest  measurement is 13 millimeters.  Findings are consistent with monochorionic-diamniotic twin  pregnancy with twin reversed arterial perfusion (TRAP)  sequence.  We discussed some of the following:  -Diagnosis is made at a very early gestational age. Some of  these can result in "vanishing twin" with good outcomes in the  surviving twin.  -Follow-up ultrasound in 3 weeks may identify persistence of  TRAP sequence or "vanishing twin."  -Early diagnosis may not always be accurate.  -In established TRAP sequence, management, intervention  or expectant management is chosen. Intervention by cord  ligation or ablation (laser or radiofrequency ablation),  performed at specialized centers, carry a better prognosis  (survival of co-twin) than when expectant management is  chosen. Survival rates are over 80%.  -Most published reports choose intervention after 16 or 18  weeks. Early intervention after 12 weeks has been  mentioned. Case reports are too few.  -Overall, I informed that no intervention exists before 12  weeks (possibly not before 16 or 18 weeks).  -I do not want to comment on prognosis and survival of the  normal twin at this gestational age by calculating estimated  fetal weights that can be inaccurate.  She has plans to go to [place] to take care of her  mother for a month now. I do not see any likelihood of  intervention before 13 weeks. We will explore if she can have  another ultrasound and opinion in [place] which has  experience in the management of TRAP  sequence. ---------------------------------------------------------------------- Recommendations  -Follow-up scan in 3 to 4 weeks.  -A limited scan may be performed next week before she goes  to [place]. ----------------------------------------------------------------------                  Tama High, MD Electronically Signed Final Report   10/30/2018 12:19 pm ----------------------------------------------------------------------  [place] Mfm Ob Limited  Result Date: 10/30/2018 ----------------------------------------------------------------------  OBSTETRICS REPORT                       (  Signed Final 10/30/2018 12:19 pm) ---------------------------------------------------------------------- Patient Info  ID #:       [id number]                          D.O.B.:  10-May-1980 (38 yrs)  Name:       ALYSSABETH BRUSTER Elling               Visit Date: 10/30/2018 08:26 am ---------------------------------------------------------------------- Performed By  Performed By:     Rodrigo Ran BS      Ref. Address:     [address]                    [place]                                                             [place], [place]  Attending:        Tama High MD        [facility]  Referred By:      Osborne Oman MD ---------------------------------------------------------------------- Orders   #  Description                          Code         Ordered By   1  [place] MFM OB TRANSVAGINAL               92119.4      Verita Schneiders   2  [place] MFM OB LIMITED                    17408.14     Verita Schneiders  ----------------------------------------------------------------------   #  Order #                    Accession #                 Episode #   1  [id number]  [id number]                   [id number]   2  [id number]                  [id number]                  [id number]  ---------------------------------------------------------------------- Indications   Advanced maternal age primigravida 11+,        O78.511   first trimester   Hypothyroid                                    O99.280 E03.9   Pregnancy with inconclusive fetal viability    O36.80X0   Twin pregnancy, mono/di, first trimester       O30.031   [redacted] weeks gestation of pregnancy                 Z3A.08  ---------------------------------------------------------------------- Fetal Evaluation (Fetus A)  Num Of Fetuses:         2  Preg. Location:         Intrauterine  Gest. Sac:              Intrauterine  Yolk Sac:               Visualized  Fetal Pole:             Visualized  Fetal Heart Rate(bpm):  177  Cardiac Activity:       Observed ---------------------------------------------------------------------- Biometry (Fetus A)  CRL:      21.1  mm     G. Age:  8w 4d                   EDD:   06/07/19 ---------------------------------------------------------------------- OB History  Gravidity:    1         Term:   0        Prem:   0        SAB:   0  TOP:          0       Ectopic:  0        Living: 0 ---------------------------------------------------------------------- Gestational Age (Fetus A)  LMP:           8w 1d         Date:  09/03/18                 EDD:   06/10/19  Best:          8w 1d      Det. By:  LMP  (09/03/18)          EDD:   06/10/19 ---------------------------------------------------------------------- Fetal Evaluation (Fetus B)  Num Of Fetuses:         2  Preg. Location:         Intrauterine  Gest. Sac:              Intrauterine  Yolk Sac:               Visualized  Fetal Pole:             Visualized  Cardiac Activity:       Absent; see comments ---------------------------------------------------------------------- Biometry (Fetus B)  CRL:        13  mm     G. Age:  7w 4d  EDD:   06/14/19  ---------------------------------------------------------------------- Gestational Age (Fetus B)  LMP:           8w 1d         Date:  09/03/18                 EDD:   06/10/19  Best:          8w 1d      Det. By:  LMP  (09/03/18)          EDD:   06/10/19 ---------------------------------------------------------------------- Cervix Uterus Adnexa  Cervix  Closed  Uterus  No abnormality visualized.  Left Ovary  Within normal limits.  Right Ovary  Within normal limits; corpus luteum seen  Cul De Sac  No free fluid seen.  Adnexa  No abnormality visualized. ---------------------------------------------------------------------- Impression  Dr. Rosana Hoes, G1 P0 at 8w 1d gestation, is here for early  pregnancy confirmation. This is a natural conception.  We performed transabdominal and transvaginal ultrasound to  better-evaluate the pregnancy.  Monochorionic-diamniotic twin pregnancy was confirmed.  A thin-dividing membrane was seen.  Twin A: Lower fetus. The [place] measurement is consistent  with her LMP dates and good fetal heart activity is seen.  Twin B: No clearly-identifiable cranium or limbs or fetal heart  are seen. A rudimendary amorphous structure is seen. Color  Doppler showed a feeding vessel going into the fetus is seen.  The feeder vessel is from twin A ("pump twin"). The longest  measurement is 13 millimeters.  Findings are consistent with monochorionic-diamniotic twin  pregnancy with twin reversed arterial perfusion (TRAP)  sequence.  We discussed some of the following:  -Diagnosis is made at a very early gestational age. Some of  these can result in "vanishing twin" with good outcomes in the  surviving twin.  -Follow-up ultrasound in 3 weeks may identify persistence of  TRAP sequence or "vanishing twin."  -Early diagnosis may not always be accurate.  -In established TRAP sequence, management, intervention  or expectant management is chosen. Intervention by cord  ligation or ablation (laser or radiofrequency  ablation),  performed at specialized centers, carry a better prognosis  (survival of co-twin) than when expectant management is  chosen. Survival rates are over 80%.  -Most published reports choose intervention after 16 or 18  weeks. Early intervention after 12 weeks has been  mentioned. Case reports are too few.  -Overall, I informed that no intervention exists before 12  weeks (possibly not before 16 or 18 weeks).  -I do not want to comment on prognosis and survival of the  normal twin at this gestational age by calculating estimated  fetal weights that can be inaccurate.  She has plans to go to [place] to take care of her  mother for a month now. I do not see any likelihood of  intervention before 13 weeks. We will explore if she can have  another ultrasound and opinion in [place] which has  experience in the management of TRAP sequence. ---------------------------------------------------------------------- Recommendations  -Follow-up scan in 3 to 4 weeks.  -A limited scan may be performed next week before she goes  to [place]. ----------------------------------------------------------------------                  Tama High, MD Electronically Signed Final Report   10/30/2018 12:19 pm ----------------------------------------------------------------------   Assessment and Plan:  Pregnancy: G1P0 at [redacted]w[redacted]d 1. Supervision of high risk pregnancy, antepartum  anatomy scan at 18-20 weeks   2. Acardiac twin during pregnancy, antepartum,  fetus 2 of multiple gestation Pt has an appt at [facility] to assess need for cord ligation or ablation.   Not sent to [facility] to see if they perform cord ligation or ablation and where they refer their pts for this cond.  3. Advanced maternal age, primigravida, antepartum S/p genetic testing.  Begin baby ASA    4. Hypothyroid in pregnancy, antepartum On Synthroid.   Preterm labor symptoms and general obstetric precautions  including but not limited to vaginal bleeding, contractions, leaking of fluid and fetal movement were reviewed in detail with the patient. I discussed the assessment and treatment plan with the patient. The patient was provided an opportunity to ask questions and all were answered. The patient agreed with the plan and demonstrated an understanding of the instructions. The patient was advised to call back or seek an in-person office evaluation/go to [facility] at [facility] for any urgent or concerning symptoms. Please refer to After Visit Summary for other counseling recommendations.   I provided 10 minutes of face-to-face time during this encounter.  No follow-ups on file.  No future appointments.  [name], MD [facility], [place]

## 2018-11-30 DIAGNOSIS — Z3A12 12 weeks gestation of pregnancy: Secondary | ICD-10-CM | POA: Diagnosis not present

## 2018-11-30 DIAGNOSIS — O358XX2 Maternal care for other (suspected) fetal abnormality and damage, fetus 2: Secondary | ICD-10-CM | POA: Diagnosis not present

## 2018-11-30 DIAGNOSIS — O30031 Twin pregnancy, monochorionic/diamniotic, first trimester: Secondary | ICD-10-CM | POA: Diagnosis not present

## 2018-12-08 ENCOUNTER — Other Ambulatory Visit: Payer: Self-pay

## 2018-12-08 DIAGNOSIS — O0990 Supervision of high risk pregnancy, unspecified, unspecified trimester: Secondary | ICD-10-CM

## 2018-12-08 DIAGNOSIS — O09519 Supervision of elderly primigravida, unspecified trimester: Secondary | ICD-10-CM

## 2018-12-08 DIAGNOSIS — O99280 Endocrine, nutritional and metabolic diseases complicating pregnancy, unspecified trimester: Secondary | ICD-10-CM

## 2018-12-08 DIAGNOSIS — E039 Hypothyroidism, unspecified: Secondary | ICD-10-CM

## 2018-12-08 DIAGNOSIS — O358XX2 Maternal care for other (suspected) fetal abnormality and damage, fetus 2: Secondary | ICD-10-CM

## 2018-12-17 ENCOUNTER — Other Ambulatory Visit: Payer: Self-pay | Admitting: Family Medicine

## 2018-12-17 DIAGNOSIS — E039 Hypothyroidism, unspecified: Secondary | ICD-10-CM

## 2018-12-18 ENCOUNTER — Encounter: Payer: Self-pay | Admitting: Family Medicine

## 2018-12-20 ENCOUNTER — Other Ambulatory Visit: Payer: Self-pay

## 2018-12-20 ENCOUNTER — Other Ambulatory Visit (HOSPITAL_COMMUNITY): Payer: Self-pay | Admitting: *Deleted

## 2018-12-20 ENCOUNTER — Ambulatory Visit (HOSPITAL_COMMUNITY)
Admission: RE | Admit: 2018-12-20 | Discharge: 2018-12-20 | Disposition: A | Discharge status: Home / Self Care | Payer: 59 | Source: Ambulatory Visit | Attending: Obstetrics and Gynecology | Admitting: Obstetrics and Gynecology

## 2018-12-20 DIAGNOSIS — O0990 Supervision of high risk pregnancy, unspecified, unspecified trimester: Secondary | ICD-10-CM | POA: Diagnosis not present

## 2018-12-20 DIAGNOSIS — O283 Abnormal ultrasonic finding on antenatal screening of mother: Secondary | ICD-10-CM

## 2018-12-20 DIAGNOSIS — E039 Hypothyroidism, unspecified: Secondary | ICD-10-CM | POA: Diagnosis not present

## 2018-12-20 DIAGNOSIS — O99280 Endocrine, nutritional and metabolic diseases complicating pregnancy, unspecified trimester: Secondary | ICD-10-CM | POA: Insufficient documentation

## 2018-12-20 DIAGNOSIS — O99282 Endocrine, nutritional and metabolic diseases complicating pregnancy, second trimester: Secondary | ICD-10-CM

## 2018-12-20 DIAGNOSIS — O09519 Supervision of elderly primigravida, unspecified trimester: Secondary | ICD-10-CM | POA: Diagnosis not present

## 2018-12-20 DIAGNOSIS — Z3A15 15 weeks gestation of pregnancy: Secondary | ICD-10-CM

## 2018-12-20 DIAGNOSIS — O09512 Supervision of elderly primigravida, second trimester: Secondary | ICD-10-CM

## 2018-12-20 DIAGNOSIS — O358XX2 Maternal care for other (suspected) fetal abnormality and damage, fetus 2: Secondary | ICD-10-CM | POA: Diagnosis not present

## 2018-12-20 MED ORDER — LEVOTHYROXINE SODIUM 125 MCG PO TABS: 125.0000 ug | ORAL_TABLET | Freq: Every day | ORAL | 2 refills | Status: DC

## 2018-12-20 MED FILL — LEVOTHYROXINE 125 MCG TAB: 125 | 90 days supply | Qty: 90 | Fill #0

## 2018-12-22 DIAGNOSIS — O30032 Twin pregnancy, monochorionic/diamniotic, second trimester: Secondary | ICD-10-CM | POA: Diagnosis not present

## 2018-12-22 DIAGNOSIS — Z3A15 15 weeks gestation of pregnancy: Secondary | ICD-10-CM | POA: Diagnosis not present

## 2018-12-22 DIAGNOSIS — O358XX2 Maternal care for other (suspected) fetal abnormality and damage, fetus 2: Secondary | ICD-10-CM | POA: Diagnosis not present

## 2018-12-24 ENCOUNTER — Encounter: Payer: 59 | Admitting: Obstetrics & Gynecology

## 2018-12-30 ENCOUNTER — Encounter (HOSPITAL_COMMUNITY): Payer: Self-pay

## 2018-12-30 ENCOUNTER — Other Ambulatory Visit (HOSPITAL_COMMUNITY): Payer: Self-pay | Admitting: *Deleted

## 2018-12-30 ENCOUNTER — Ambulatory Visit (HOSPITAL_COMMUNITY): Payer: 59

## 2018-12-30 ENCOUNTER — Ambulatory Visit (HOSPITAL_COMMUNITY)
Admission: RE | Admit: 2018-12-30 | Discharge: 2018-12-30 | Disposition: A | Discharge status: Home / Self Care | Payer: 59 | Source: Ambulatory Visit | Attending: Obstetrics and Gynecology | Admitting: Obstetrics and Gynecology

## 2018-12-30 ENCOUNTER — Other Ambulatory Visit: Payer: Self-pay

## 2018-12-30 DIAGNOSIS — O283 Abnormal ultrasonic finding on antenatal screening of mother: Secondary | ICD-10-CM | POA: Insufficient documentation

## 2018-12-30 DIAGNOSIS — O30032 Twin pregnancy, monochorionic/diamniotic, second trimester: Secondary | ICD-10-CM

## 2018-12-30 DIAGNOSIS — O99282 Endocrine, nutritional and metabolic diseases complicating pregnancy, second trimester: Secondary | ICD-10-CM | POA: Diagnosis not present

## 2018-12-30 DIAGNOSIS — O30002 Twin pregnancy, unspecified number of placenta and unspecified number of amniotic sacs, second trimester: Secondary | ICD-10-CM

## 2018-12-30 DIAGNOSIS — O09512 Supervision of elderly primigravida, second trimester: Secondary | ICD-10-CM

## 2018-12-30 DIAGNOSIS — E039 Hypothyroidism, unspecified: Secondary | ICD-10-CM | POA: Diagnosis not present

## 2018-12-30 DIAGNOSIS — Z3A16 16 weeks gestation of pregnancy: Secondary | ICD-10-CM | POA: Diagnosis not present

## 2018-12-30 DIAGNOSIS — O3112X0 Continuing pregnancy after spontaneous abortion of one fetus or more, second trimester, not applicable or unspecified: Secondary | ICD-10-CM | POA: Diagnosis not present

## 2018-12-30 DIAGNOSIS — Z362 Encounter for other antenatal screening follow-up: Secondary | ICD-10-CM

## 2018-12-30 DIAGNOSIS — O289 Unspecified abnormal findings on antenatal screening of mother: Secondary | ICD-10-CM | POA: Diagnosis not present

## 2019-01-03 ENCOUNTER — Other Ambulatory Visit: Payer: Self-pay

## 2019-01-03 ENCOUNTER — Encounter: Payer: Self-pay | Admitting: Obstetrics & Gynecology

## 2019-01-03 ENCOUNTER — Ambulatory Visit (INDEPENDENT_AMBULATORY_CARE_PROVIDER_SITE_OTHER): Payer: 59 | Admitting: Obstetrics & Gynecology

## 2019-01-03 VITALS — BP 103/68 | HR 65 | Wt 205.0 lb

## 2019-01-03 DIAGNOSIS — O0990 Supervision of high risk pregnancy, unspecified, unspecified trimester: Secondary | ICD-10-CM

## 2019-01-03 DIAGNOSIS — O358XX2 Maternal care for other (suspected) fetal abnormality and damage, fetus 2: Secondary | ICD-10-CM

## 2019-01-03 DIAGNOSIS — Z23 Encounter for immunization: Secondary | ICD-10-CM

## 2019-01-03 DIAGNOSIS — O09512 Supervision of elderly primigravida, second trimester: Secondary | ICD-10-CM

## 2019-01-03 DIAGNOSIS — E039 Hypothyroidism, unspecified: Secondary | ICD-10-CM

## 2019-01-03 DIAGNOSIS — O09519 Supervision of elderly primigravida, unspecified trimester: Secondary | ICD-10-CM

## 2019-01-03 DIAGNOSIS — O0992 Supervision of high risk pregnancy, unspecified, second trimester: Secondary | ICD-10-CM

## 2019-01-03 DIAGNOSIS — O99282 Endocrine, nutritional and metabolic diseases complicating pregnancy, second trimester: Secondary | ICD-10-CM

## 2019-01-03 DIAGNOSIS — Z3A17 17 weeks gestation of pregnancy: Secondary | ICD-10-CM

## 2019-01-03 DIAGNOSIS — O99280 Endocrine, nutritional and metabolic diseases complicating pregnancy, unspecified trimester: Secondary | ICD-10-CM

## 2019-01-03 DIAGNOSIS — O98912 Unspecified maternal infectious and parasitic disease complicating pregnancy, second trimester: Secondary | ICD-10-CM

## 2019-01-03 DIAGNOSIS — O98919 Unspecified maternal infectious and parasitic disease complicating pregnancy, unspecified trimester: Secondary | ICD-10-CM | POA: Insufficient documentation

## 2019-01-03 NOTE — Progress Notes (Signed)
   PRENATAL VISIT NOTE  Subjective:  Tina [name] is a 38 y.o. G1P0 at [redacted]w[redacted]d being seen today for ongoing prenatal care.  She is currently monitored for the following issues for this high-risk pregnancy and has Hypothyroid in pregnancy, antepartum; History of rectal bleeding; Nevus of scalp; Neoplasm of uncertain behavior of skin; Melasma; Nevus spilus; Paresthesias; Supervision of high risk pregnancy, antepartum; Advanced maternal age, primigravida, antepartum; and Twin reversed-arterial perfusion (TRAP) sequence pregnancy, antepartum on their problem list.  Patient reports occ nausea- only taking meds occ now.  .  Contractions: Not present. Vag. Bleeding: None.   . Denies leaking of fluid.   The following portions of the patient's history were reviewed and updated as appropriate: allergies, current medications, past family history, past medical history, past social history, past surgical history and problem list.   Objective:   Vitals:   01/03/19 1058  BP: 103/68  Pulse: 65  Weight: 205 lb (93 kg)    Fetal Status:           General:  Alert, oriented and cooperative. Patient is in no acute distress.  Skin: Skin is warm and dry. No rash noted.   Cardiovascular: Normal heart rate noted  Respiratory: Normal respiratory effort, no problems with respiration noted  Abdomen: Soft, gravid, appropriate for gestational age.  Pain/Pressure: Absent     Pelvic: Cervical exam deferred        Extremities: Normal range of motion.  Edema: None  Mental Status: Normal mood and affect. Normal behavior. Normal judgment and thought content.   Assessment and Plan:  Pregnancy: G1P0 at [redacted]w[redacted]d 1. Supervision of high risk pregnancy, antepartum  - AFP TETRA - Flu Vaccine QUAD 36+ mos IM (Fluarix, Quad PF) Has [place] scheduled for 9/25  2. Advanced maternal age, primigravida, antepartum  - AFP TETRA  3. Acardiac twin during pregnancy, antepartum, fetus 2 of multiple gestation currently managed as a  singleton pregnancy. Was at the [facility] for potential endovascular ablation and the blood flow to the acardiac twin was resolved. The procedure was NOT performed.   - AFP TETRA  4. Hypothyroid in pregnancy, antepartum On thyroids supplementation Repeat labs q trimester  Preterm labor symptoms and general obstetric precautions including but not limited to vaginal bleeding, contractions, leaking of fluid and fetal movement were reviewed in detail with the patient. Please refer to After Visit Summary for other counseling recommendations.   Return in about 4 weeks (around 01/31/2019).  Future Appointments  Date Time Provider [place]  01/14/2019  1:30 PM [facility] MFC-[facility]  01/14/2019  1:30 PM [place] 1 WH-MFCUS MFC-US    [name], MD

## 2019-01-03 NOTE — Patient Instructions (Signed)

## 2019-01-03 NOTE — Progress Notes (Signed)
Patient is 17-6 weeks. Desires AFP and flu shot today. Kathrene Alu RN

## 2019-01-07 LAB — AFP TETRA
DIA Mom Value: 0.84
DIA Value (EIA): 115.5 pg/mL
DSR (By Age)    1 IN: 189
DSR (Second Trimester) 1 IN: 8859
Gestational Age: 17.6 WEEKS
MSAFP Mom: 6.9
MSAFP: 229.2 ng/mL
MSHCG Mom: 1.02
MSHCG: 25562 m[IU]/mL
Maternal Age At EDD: 37 yr
Osb Risk: 10
T18 (By Age): 1:734 {titer}
Test Results:: POSITIVE — AB
Weight: 205 [lb_av]
uE3 Mom: 1.39
uE3 Value: 1.62 ng/mL

## 2019-01-10 ENCOUNTER — Telehealth: Payer: Self-pay | Admitting: Obstetrics & Gynecology

## 2019-01-10 NOTE — Telephone Encounter (Signed)
TC to pt to discuss her AFP results. The test was run as a twin gestation and as a singleton. I believe the abnormal results could be related to the acardiac twin but, the [place], which is scheduled for this coming Friday will assist with the assumption.   Will notify MFM of the results so that this area can be targeted. All questions answered.   Zephan Beauchaine L. Harraway-Smith, M.D., Cherlynn June

## 2019-01-12 ENCOUNTER — Ambulatory Visit: Payer: 59 | Admitting: Obstetrics & Gynecology

## 2019-01-14 ENCOUNTER — Ambulatory Visit (HOSPITAL_COMMUNITY): Payer: 59 | Admitting: *Deleted

## 2019-01-14 ENCOUNTER — Encounter (HOSPITAL_COMMUNITY): Payer: Self-pay

## 2019-01-14 ENCOUNTER — Ambulatory Visit (HOSPITAL_COMMUNITY)
Admission: RE | Admit: 2019-01-14 | Discharge: 2019-01-14 | Disposition: A | Discharge status: Home / Self Care | Payer: 59 | Source: Ambulatory Visit | Attending: Obstetrics and Gynecology | Admitting: Obstetrics and Gynecology

## 2019-01-14 ENCOUNTER — Other Ambulatory Visit: Payer: Self-pay

## 2019-01-14 ENCOUNTER — Other Ambulatory Visit (HOSPITAL_COMMUNITY): Payer: Self-pay | Admitting: *Deleted

## 2019-01-14 DIAGNOSIS — O3112X0 Continuing pregnancy after spontaneous abortion of one fetus or more, second trimester, not applicable or unspecified: Secondary | ICD-10-CM

## 2019-01-14 DIAGNOSIS — E039 Hypothyroidism, unspecified: Secondary | ICD-10-CM | POA: Diagnosis not present

## 2019-01-14 DIAGNOSIS — Z362 Encounter for other antenatal screening follow-up: Secondary | ICD-10-CM | POA: Insufficient documentation

## 2019-01-14 DIAGNOSIS — O09519 Supervision of elderly primigravida, unspecified trimester: Secondary | ICD-10-CM

## 2019-01-14 DIAGNOSIS — Z3A19 19 weeks gestation of pregnancy: Secondary | ICD-10-CM

## 2019-01-14 DIAGNOSIS — O0990 Supervision of high risk pregnancy, unspecified, unspecified trimester: Secondary | ICD-10-CM | POA: Insufficient documentation

## 2019-01-14 DIAGNOSIS — O30002 Twin pregnancy, unspecified number of placenta and unspecified number of amniotic sacs, second trimester: Secondary | ICD-10-CM

## 2019-01-14 DIAGNOSIS — O358XX2 Maternal care for other (suspected) fetal abnormality and damage, fetus 2: Secondary | ICD-10-CM

## 2019-01-14 DIAGNOSIS — O30032 Twin pregnancy, monochorionic/diamniotic, second trimester: Secondary | ICD-10-CM | POA: Diagnosis not present

## 2019-01-14 DIAGNOSIS — O99282 Endocrine, nutritional and metabolic diseases complicating pregnancy, second trimester: Secondary | ICD-10-CM

## 2019-01-14 DIAGNOSIS — O98919 Unspecified maternal infectious and parasitic disease complicating pregnancy, unspecified trimester: Secondary | ICD-10-CM | POA: Diagnosis not present

## 2019-01-14 DIAGNOSIS — O358XX0 Maternal care for other (suspected) fetal abnormality and damage, not applicable or unspecified: Secondary | ICD-10-CM

## 2019-01-21 ENCOUNTER — Ambulatory Visit (INDEPENDENT_AMBULATORY_CARE_PROVIDER_SITE_OTHER): Payer: 59 | Admitting: Obstetrics & Gynecology

## 2019-01-21 ENCOUNTER — Other Ambulatory Visit: Payer: Self-pay

## 2019-01-21 VITALS — BP 100/60 | HR 70

## 2019-01-21 DIAGNOSIS — O358XX9 Maternal care for other (suspected) fetal abnormality and damage, other fetus: Secondary | ICD-10-CM

## 2019-01-21 DIAGNOSIS — Z3A21 21 weeks gestation of pregnancy: Secondary | ICD-10-CM

## 2019-01-21 DIAGNOSIS — O09519 Supervision of elderly primigravida, unspecified trimester: Secondary | ICD-10-CM

## 2019-01-21 DIAGNOSIS — O0992 Supervision of high risk pregnancy, unspecified, second trimester: Secondary | ICD-10-CM

## 2019-01-21 DIAGNOSIS — O0990 Supervision of high risk pregnancy, unspecified, unspecified trimester: Secondary | ICD-10-CM

## 2019-01-21 DIAGNOSIS — O09512 Supervision of elderly primigravida, second trimester: Secondary | ICD-10-CM

## 2019-01-21 NOTE — Progress Notes (Signed)
Patient has had watery discharge for one week. Kathrene Alu RN

## 2019-01-25 NOTE — Progress Notes (Signed)
   PRENATAL VISIT NOTE  Subjective:  Tina [name] is a 38 y.o. G1P0 at [redacted]w[redacted]d being seen today for ongoing prenatal care.  She is currently monitored for the following issues for this high-risk pregnancy and has Hypothyroid in pregnancy, antepartum; History of rectal bleeding; Nevus of scalp; Neoplasm of uncertain behavior of skin; Melasma; Nevus spilus; Paresthesias; Supervision of high risk pregnancy, antepartum; Advanced maternal age, primigravida, antepartum; Twin reversed-arterial perfusion (TRAP) sequence pregnancy, antepartum; and Infection in pregnancy, antepartum on their problem list.  Patient reports clear liquid from vagina with excessive discharge. Pt was recently sexually active but, feels that it was far enough away that the discharge should not be due to that.  This is a problem visit. .  Contractions: Not present. Vag. Bleeding: None.  Movement: Present. Denies leaking of fluid.   The following portions of the patient's history were reviewed and updated as appropriate: allergies, current medications, past family history, past medical history, past social history, past surgical history and problem list.   Objective:   Vitals:   01/21/19 1212  BP: 100/60  Pulse: 70    Fetal Status: Fetal Heart Rate (bpm): 155   Movement: Present     General:  Alert, oriented and cooperative. Patient is in no acute distress.  Skin: Skin is warm and dry. No rash noted.   Cardiovascular: Normal heart rate noted  Respiratory: Normal respiratory effort, no problems with respiration noted  Abdomen: Soft, gravid, appropriate for gestational age.  Pain/Pressure: Absent     Pelvic: Cervical exam performed      No pooling, Normal physiologic discharge noted. Cervix long and close on visual exam.    Extremities: Normal range of motion.  Edema: None  Mental Status: Normal mood and affect. Normal behavior. Normal judgment and thought content.   Assessment and Plan:  Pregnancy: G1P0 at [redacted]w[redacted]d 1.  Supervision of high risk pregnancy, antepartum + FM No LOF or evidence of ROM   2. Acardiac twin during pregnancy, antepartum, fetus 6 or greater Resolving. No blood flow currently.   3. Advanced maternal age, primigravida, antepartum   Preterm labor symptoms and general obstetric precautions including but not limited to vaginal bleeding, contractions, leaking of fluid and fetal movement were reviewed in detail with the patient. Please refer to After Visit Summary for other counseling recommendations.   No follow-ups on file.  Future Appointments  Date Time Provider [place]  02/07/2019  8:15 AM [name], MD [facility] None  02/11/2019  7:30 AM [facility]-M[facility] NURSE [place] MFC-US  02/11/2019  7:30 AM [facility] [place] 1 WH-MFCUS MFC-US    [name], MD

## 2019-02-07 ENCOUNTER — Other Ambulatory Visit: Payer: Self-pay

## 2019-02-07 ENCOUNTER — Ambulatory Visit (INDEPENDENT_AMBULATORY_CARE_PROVIDER_SITE_OTHER): Payer: 59 | Admitting: Obstetrics & Gynecology

## 2019-02-07 VITALS — BP 94/60 | HR 66 | Wt 209.1 lb

## 2019-02-07 DIAGNOSIS — O0992 Supervision of high risk pregnancy, unspecified, second trimester: Secondary | ICD-10-CM

## 2019-02-07 DIAGNOSIS — O09519 Supervision of elderly primigravida, unspecified trimester: Secondary | ICD-10-CM

## 2019-02-07 DIAGNOSIS — Z3A22 22 weeks gestation of pregnancy: Secondary | ICD-10-CM

## 2019-02-07 DIAGNOSIS — O98919 Unspecified maternal infectious and parasitic disease complicating pregnancy, unspecified trimester: Secondary | ICD-10-CM

## 2019-02-07 DIAGNOSIS — O358XX0 Maternal care for other (suspected) fetal abnormality and damage, not applicable or unspecified: Secondary | ICD-10-CM

## 2019-02-07 DIAGNOSIS — O99280 Endocrine, nutritional and metabolic diseases complicating pregnancy, unspecified trimester: Secondary | ICD-10-CM

## 2019-02-07 DIAGNOSIS — O98912 Unspecified maternal infectious and parasitic disease complicating pregnancy, second trimester: Secondary | ICD-10-CM

## 2019-02-07 DIAGNOSIS — O09512 Supervision of elderly primigravida, second trimester: Secondary | ICD-10-CM

## 2019-02-07 DIAGNOSIS — E039 Hypothyroidism, unspecified: Secondary | ICD-10-CM

## 2019-02-07 DIAGNOSIS — O99282 Endocrine, nutritional and metabolic diseases complicating pregnancy, second trimester: Secondary | ICD-10-CM

## 2019-02-07 DIAGNOSIS — O0990 Supervision of high risk pregnancy, unspecified, unspecified trimester: Secondary | ICD-10-CM

## 2019-02-07 NOTE — Progress Notes (Signed)
   PRENATAL VISIT NOTE  Subjective:  Tina [name] is a 38 y.o. G1P0 at [redacted]w[redacted]d being seen today for ongoing prenatal care.  She is currently monitored for the following issues for this high-risk pregnancy and has Hypothyroid in pregnancy, antepartum; History of rectal bleeding; Nevus of scalp; Neoplasm of uncertain behavior of skin; Melasma; Nevus spilus; Paresthesias; Supervision of high risk pregnancy, antepartum; Advanced maternal age, primigravida, antepartum; Twin reversed-arterial perfusion (TRAP) sequence pregnancy, antepartum; and Infection in pregnancy, antepartum on their problem list.  Patient reports spotting that started just since arriving in the office today. .  Contractions: Not present. Vag. Bleeding: Scant.  Movement: Present. Denies leaking of fluid.   The following portions of the patient's history were reviewed and updated as appropriate: allergies, current medications, past family history, past medical history, past social history, past surgical history and problem list.   Objective:   Vitals:   02/07/19 0810  BP: 94/60  Pulse: 66  Weight: 209 lb 1.3 oz (94.8 kg)    Fetal Status: Fetal Heart Rate (bpm): 147   Movement: Present     General:  Alert, oriented and cooperative. Patient is in no acute distress.  Skin: Skin is warm and dry. No rash noted.   Cardiovascular: Normal heart rate noted  Respiratory: Normal respiratory effort, no problems with respiration noted  Abdomen: Soft, gravid, appropriate for gestational age.  Pain/Pressure: Absent     Pelvic: No blood noted in vault or on perineum. cervix longa dn closed visually.    Extremities: Normal range of motion.  Edema: None  Mental Status: Normal mood and affect. Normal behavior. Normal judgment and thought content.   Assessment and Plan:  Pregnancy: G1P0 at [redacted]w[redacted]d 1. Supervision of high risk pregnancy, antepartum +FM  FHR wnl 2 hour GTT on next visit  2. Acardiac twin during pregnancy, antepartum,  single or unspecified fetus S/p demise Pt questioned about need for fetal ECHO. Will query MFM   3. Infection in pregnancy, antepartum NOS  4. Hypothyroid in pregnancy, antepartum Repeat on next visit with 28 week labs  5. Advanced maternal age, primigravida, antepartum On baby ASA  Preterm labor symptoms and general obstetric precautions including but not limited to vaginal bleeding, contractions, leaking of fluid and fetal movement were reviewed in detail with the patient. Please refer to After Visit Summary for other counseling recommendations.   Return in about 4 weeks (around 03/07/2019) for in person.  Future Appointments  Date Time Provider [place]  02/11/2019  7:30 AM [place] MFC-US  02/11/2019  7:30 AM [place] 1 WH-MFCUS MFC-US    [name], MD

## 2019-02-07 NOTE — Patient Instructions (Signed)
Vaginal Bleeding During Pregnancy, Second Trimester  A small amount of bleeding (spotting) from the vagina is common during pregnancy. Sometimes the bleeding is normal and is not a sign of problems. In some other cases, it is a sign of something serious. Tell your doctor right away if there is any bleeding from your vagina. Follow these instructions at home: Activity  Follow your doctor's instructions about how active you can be.  If needed, make plans for someone to help with your normal activities.  Do not exercise or do activities that take a lot of effort until your doctor says that this is safe.  Do not lift anything that is heavier than 10 lb (4.5 kg) until your doctor says that this is safe.  Do not have sex or orgasms until your doctor says that this is safe. Medicines  Take over-the-counter and prescription medicines only as told by your doctor.  Do not take aspirin. It can cause bleeding. General instructions  Watch your condition for any changes.  Write down: ? The number of pads you use each day. ? How often you change pads. ? How soaked your pads are.  Do not use tampons.  Do not douche.  If you pass any tissue from your vagina, save it to show to your doctor.  Keep all follow-up visits as told by your doctor. This is important. Contact a doctor if:  You have bleeding in the vagina at any time during pregnancy.  You have cramps.  You have a fever that does not get better with medicine. Get help right away if:  You have very bad cramps in your back or belly (abdomen).  You have contractions.  You have chills.  You pass large clots or a lot of tissue from your vagina.  Your bleeding gets worse.  You feel light-headed.  You feel weak.  You pass out (faint).  You are leaking fluid from your vagina.  You have a gush of fluid from your vagina. Summary  Sometimes vaginal bleeding during pregnancy is normal and is not a problem. Sometimes it may  be a sign of something serious.  Tell your doctor about any bleeding from your vagina right away.  Follow your doctor's instructions about how active you can be. You may need someone to help you with your normal activities. This information is not intended to replace advice given to you by your health care provider. Make sure you discuss any questions you have with your health care provider. Document Released: 08/22/2013 Document Revised: 07/27/2018 Document Reviewed: 07/09/2016 Elsevier Patient Education  2020 [employer].

## 2019-02-09 DIAGNOSIS — H04123 Dry eye syndrome of bilateral lacrimal glands: Secondary | ICD-10-CM | POA: Diagnosis not present

## 2019-02-09 DIAGNOSIS — D492 Neoplasm of unspecified behavior of bone, soft tissue, and skin: Secondary | ICD-10-CM | POA: Diagnosis not present

## 2019-02-09 DIAGNOSIS — H5213 Myopia, bilateral: Secondary | ICD-10-CM | POA: Diagnosis not present

## 2019-02-11 ENCOUNTER — Ambulatory Visit (HOSPITAL_COMMUNITY): Payer: 59 | Admitting: *Deleted

## 2019-02-11 ENCOUNTER — Encounter (HOSPITAL_COMMUNITY): Payer: Self-pay

## 2019-02-11 ENCOUNTER — Other Ambulatory Visit (HOSPITAL_COMMUNITY): Payer: Self-pay | Admitting: *Deleted

## 2019-02-11 ENCOUNTER — Ambulatory Visit (HOSPITAL_COMMUNITY)
Admission: RE | Admit: 2019-02-11 | Discharge: 2019-02-11 | Disposition: A | Discharge status: Home / Self Care | Payer: 59 | Source: Ambulatory Visit | Attending: Maternal & Fetal Medicine | Admitting: Maternal & Fetal Medicine

## 2019-02-11 ENCOUNTER — Other Ambulatory Visit: Payer: Self-pay

## 2019-02-11 DIAGNOSIS — O30002 Twin pregnancy, unspecified number of placenta and unspecified number of amniotic sacs, second trimester: Secondary | ICD-10-CM | POA: Diagnosis not present

## 2019-02-11 DIAGNOSIS — O3112X0 Continuing pregnancy after spontaneous abortion of one fetus or more, second trimester, not applicable or unspecified: Secondary | ICD-10-CM

## 2019-02-11 DIAGNOSIS — O09512 Supervision of elderly primigravida, second trimester: Secondary | ICD-10-CM

## 2019-02-11 DIAGNOSIS — E039 Hypothyroidism, unspecified: Secondary | ICD-10-CM | POA: Diagnosis not present

## 2019-02-11 DIAGNOSIS — O30032 Twin pregnancy, monochorionic/diamniotic, second trimester: Secondary | ICD-10-CM

## 2019-02-11 DIAGNOSIS — Z362 Encounter for other antenatal screening follow-up: Secondary | ICD-10-CM | POA: Diagnosis not present

## 2019-02-11 DIAGNOSIS — Z3A23 23 weeks gestation of pregnancy: Secondary | ICD-10-CM

## 2019-02-11 DIAGNOSIS — O0990 Supervision of high risk pregnancy, unspecified, unspecified trimester: Secondary | ICD-10-CM | POA: Diagnosis not present

## 2019-02-11 DIAGNOSIS — O09519 Supervision of elderly primigravida, unspecified trimester: Secondary | ICD-10-CM | POA: Diagnosis not present

## 2019-02-11 DIAGNOSIS — O98919 Unspecified maternal infectious and parasitic disease complicating pregnancy, unspecified trimester: Secondary | ICD-10-CM

## 2019-02-11 DIAGNOSIS — O99282 Endocrine, nutritional and metabolic diseases complicating pregnancy, second trimester: Secondary | ICD-10-CM

## 2019-02-11 DIAGNOSIS — O358XX0 Maternal care for other (suspected) fetal abnormality and damage, not applicable or unspecified: Secondary | ICD-10-CM | POA: Diagnosis not present

## 2019-02-15 ENCOUNTER — Encounter (HOSPITAL_COMMUNITY): Payer: Self-pay | Admitting: Obstetrics and Gynecology

## 2019-02-15 DIAGNOSIS — O358XX2 Maternal care for other (suspected) fetal abnormality and damage, fetus 2: Secondary | ICD-10-CM | POA: Diagnosis not present

## 2019-02-15 DIAGNOSIS — O30032 Twin pregnancy, monochorionic/diamniotic, second trimester: Secondary | ICD-10-CM | POA: Diagnosis not present

## 2019-02-15 DIAGNOSIS — Z3A23 23 weeks gestation of pregnancy: Secondary | ICD-10-CM | POA: Diagnosis not present

## 2019-02-28 ENCOUNTER — Encounter: Payer: Self-pay | Admitting: Obstetrics & Gynecology

## 2019-02-28 ENCOUNTER — Other Ambulatory Visit: Payer: Self-pay

## 2019-02-28 ENCOUNTER — Ambulatory Visit (INDEPENDENT_AMBULATORY_CARE_PROVIDER_SITE_OTHER): Payer: 59 | Admitting: Obstetrics & Gynecology

## 2019-02-28 VITALS — BP 96/57 | HR 62 | Wt 213.0 lb

## 2019-02-28 DIAGNOSIS — O0993 Supervision of high risk pregnancy, unspecified, third trimester: Secondary | ICD-10-CM

## 2019-02-28 DIAGNOSIS — O98919 Unspecified maternal infectious and parasitic disease complicating pregnancy, unspecified trimester: Secondary | ICD-10-CM

## 2019-02-28 DIAGNOSIS — E039 Hypothyroidism, unspecified: Secondary | ICD-10-CM

## 2019-02-28 DIAGNOSIS — Z3A25 25 weeks gestation of pregnancy: Secondary | ICD-10-CM

## 2019-02-28 DIAGNOSIS — O09519 Supervision of elderly primigravida, unspecified trimester: Secondary | ICD-10-CM

## 2019-02-28 DIAGNOSIS — O99280 Endocrine, nutritional and metabolic diseases complicating pregnancy, unspecified trimester: Secondary | ICD-10-CM | POA: Diagnosis not present

## 2019-02-28 DIAGNOSIS — O0990 Supervision of high risk pregnancy, unspecified, unspecified trimester: Secondary | ICD-10-CM

## 2019-02-28 DIAGNOSIS — O99283 Endocrine, nutritional and metabolic diseases complicating pregnancy, third trimester: Secondary | ICD-10-CM

## 2019-02-28 DIAGNOSIS — O98913 Unspecified maternal infectious and parasitic disease complicating pregnancy, third trimester: Secondary | ICD-10-CM

## 2019-02-28 DIAGNOSIS — O358XX0 Maternal care for other (suspected) fetal abnormality and damage, not applicable or unspecified: Secondary | ICD-10-CM

## 2019-02-28 DIAGNOSIS — Z23 Encounter for immunization: Secondary | ICD-10-CM | POA: Diagnosis not present

## 2019-02-28 DIAGNOSIS — O09513 Supervision of elderly primigravida, third trimester: Secondary | ICD-10-CM

## 2019-02-28 NOTE — Progress Notes (Signed)
   PRENATAL VISIT NOTE  Subjective:  Tina [name] is a 38 y.o. G1P0 at [redacted]w[redacted]d being seen today for ongoing prenatal care.  She is currently monitored for the following issues for this high-risk pregnancy and has Hypothyroid in pregnancy, antepartum; History of rectal bleeding; Nevus of scalp; Neoplasm of uncertain behavior of skin; Melasma; Nevus spilus; Paresthesias; Supervision of high risk pregnancy, antepartum; Advanced maternal age, primigravida, antepartum; Twin reversed-arterial perfusion (TRAP) sequence pregnancy, antepartum; and Infection in pregnancy, antepartum on their problem list.  Patient reports pt is sad after changes due to COVID- personal and professional.   .  Contractions: Not present. Vag. Bleeding: None.  Movement: Present. Denies leaking of fluid.   The following portions of the patient's history were reviewed and updated as appropriate: allergies, current medications, past family history, past medical history, past social history, past surgical history and problem list.   Objective:   Vitals:   02/28/19 0812  BP: (!) 96/57  Pulse: 62  Weight: 213 lb (96.6 kg)    Fetal Status: Fetal Heart Rate (bpm): 141   Movement: Present     General:  Alert, oriented and cooperative. Patient is in no acute distress.  Skin: Skin is warm and dry. No rash noted.   Cardiovascular: Normal heart rate noted  Respiratory: Normal respiratory effort, no problems with respiration noted  Abdomen: Soft, gravid, appropriate for gestational age.  Pain/Pressure: Present     Pelvic: Cervical exam deferred        Extremities: Normal range of motion.  Edema: None  Mental Status: Normal mood and affect. Normal behavior. Normal judgment and thought content.   Assessment and Plan:  Pregnancy: G1P0 at [redacted]w[redacted]d 1. Supervision of high risk pregnancy, antepartum  - CBC - Glucose Tolerance, 2 Hours w/1 Hour - HIV antibody - RPR - Tdap vaccine greater than or equal to 7yo IM  2. Acardiac twin  during pregnancy, antepartum, single or unspecified fetus Twin is resolving   3. Hypothyroid in pregnancy, antepartum TSH repeated today   4. Infection in pregnancy, antepartum  5. Advanced maternal age, primigravida, antepartum On daily ASA  Preterm labor symptoms and general obstetric precautions including but not limited to vaginal bleeding, contractions, leaking of fluid and fetal movement were reviewed in detail with the patient. Please refer to After Visit Summary for other counseling recommendations.   Return in about 4 weeks (around 03/28/2019) for in person.  Future Appointments  Date Time Provider [place]  03/23/2019  3:00 PM [place] MFC-US  03/23/2019  3:00 PM [place] 3 WH-MFCUS MFC-US    [name], MD

## 2019-03-01 LAB — CBC
Hematocrit: 36.6 % (ref 34.0–46.6)
Hemoglobin: 12.3 g/dL (ref 11.1–15.9)
MCH: 31.6 pg (ref 26.6–33.0)
MCHC: 33.6 g/dL (ref 31.5–35.7)
MCV: 94 fL (ref 79–97)
Platelets: 250 10*3/uL (ref 150–450)
RBC: 3.89 x10E6/uL (ref 3.77–5.28)
RDW: 12.8 % (ref 11.7–15.4)
WBC: 8.3 10*3/uL (ref 3.4–10.8)

## 2019-03-01 LAB — RPR: RPR Ser Ql: NONREACTIVE

## 2019-03-01 LAB — GLUCOSE TOLERANCE, 2 HOURS W/ 1HR
Glucose, 1 hour: 123 mg/dL (ref 65–179)
Glucose, 2 hour: 104 mg/dL (ref 65–152)
Glucose, Fasting: 74 mg/dL (ref 65–91)

## 2019-03-01 LAB — HIV ANTIBODY (ROUTINE TESTING W REFLEX): HIV Screen 4th Generation wRfx: NONREACTIVE

## 2019-03-01 LAB — TSH: TSH: 12.1 u[IU]/mL — ABNORMAL HIGH (ref 0.450–4.500)

## 2019-03-10 ENCOUNTER — Other Ambulatory Visit: Payer: Self-pay | Admitting: Obstetrics & Gynecology

## 2019-03-10 LAB — T4: T4, Total: 12.2 ug/dL — ABNORMAL HIGH (ref 4.5–12.0)

## 2019-03-10 LAB — SPECIMEN STATUS REPORT

## 2019-03-10 LAB — T3: T3, Total: 150 ng/dL (ref 71–180)

## 2019-03-11 ENCOUNTER — Other Ambulatory Visit: Payer: Self-pay | Admitting: Obstetrics & Gynecology

## 2019-03-11 ENCOUNTER — Telehealth: Payer: Self-pay | Admitting: Obstetrics & Gynecology

## 2019-03-11 DIAGNOSIS — E039 Hypothyroidism, unspecified: Secondary | ICD-10-CM

## 2019-03-11 MED ORDER — LEVOTHYROXINE SODIUM 150 MCG PO TABS: 150.0000 ug | ORAL_TABLET | Freq: Every day | ORAL | 3 refills | Status: DC

## 2019-03-11 NOTE — Telephone Encounter (Signed)
TC to pt re elevated thyroid levels. Will adjust Synthroid ot 1110mcg. All questions answered.   Tina [name] L. Harraway-Smith, M.D., Cherlynn June

## 2019-03-14 ENCOUNTER — Telehealth (INDEPENDENT_AMBULATORY_CARE_PROVIDER_SITE_OTHER): Payer: 59 | Admitting: Obstetrics & Gynecology

## 2019-03-14 ENCOUNTER — Encounter: Payer: Self-pay | Admitting: Obstetrics & Gynecology

## 2019-03-14 ENCOUNTER — Other Ambulatory Visit: Payer: Self-pay

## 2019-03-14 DIAGNOSIS — O0990 Supervision of high risk pregnancy, unspecified, unspecified trimester: Secondary | ICD-10-CM

## 2019-03-14 DIAGNOSIS — Z3A27 27 weeks gestation of pregnancy: Secondary | ICD-10-CM

## 2019-03-14 DIAGNOSIS — O09519 Supervision of elderly primigravida, unspecified trimester: Secondary | ICD-10-CM

## 2019-03-14 DIAGNOSIS — O98912 Unspecified maternal infectious and parasitic disease complicating pregnancy, second trimester: Secondary | ICD-10-CM

## 2019-03-14 DIAGNOSIS — O99282 Endocrine, nutritional and metabolic diseases complicating pregnancy, second trimester: Secondary | ICD-10-CM

## 2019-03-14 DIAGNOSIS — O358XX0 Maternal care for other (suspected) fetal abnormality and damage, not applicable or unspecified: Secondary | ICD-10-CM

## 2019-03-14 DIAGNOSIS — O98919 Unspecified maternal infectious and parasitic disease complicating pregnancy, unspecified trimester: Secondary | ICD-10-CM

## 2019-03-14 DIAGNOSIS — O0992 Supervision of high risk pregnancy, unspecified, second trimester: Secondary | ICD-10-CM

## 2019-03-14 DIAGNOSIS — E039 Hypothyroidism, unspecified: Secondary | ICD-10-CM

## 2019-03-14 DIAGNOSIS — O99280 Endocrine, nutritional and metabolic diseases complicating pregnancy, unspecified trimester: Secondary | ICD-10-CM

## 2019-03-14 DIAGNOSIS — O09512 Supervision of elderly primigravida, second trimester: Secondary | ICD-10-CM

## 2019-03-14 NOTE — Progress Notes (Signed)
   TELEHEALTH OBSTETRICS PRENATAL VIRTUAL VIDEO VISIT ENCOUNTER NOTE  Provider location: [facility] [place] connected with [name] on 03/14/19 at  8:15 AM EST by MyChart Video Encounter at home and verified that I am speaking with the correct person using two identifiers.   I discussed the limitations, risks, security and privacy concerns of performing an evaluation and management service virtually and the availability of in person appointments. I also discussed with the patient that there may be a patient responsible charge related to this service. The patient expressed understanding and agreed to proceed. Subjective:  Tina [name] is a 38 y.o. G1P0 at [redacted]w[redacted]d being seen today for ongoing prenatal care.  She is currently monitored for the following issues for this high-risk pregnancy and has Hypothyroid in pregnancy, antepartum; History of rectal bleeding; Nevus of scalp; Neoplasm of uncertain behavior of skin; Melasma; Nevus spilus; Paresthesias; Supervision of high risk pregnancy, antepartum; Advanced maternal age, primigravida, antepartum; Twin reversed-arterial perfusion (TRAP) sequence pregnancy, antepartum; and Infection in pregnancy, antepartum on their problem list.  Patient reports decreased FM. Seen in the MAU.  Denies any leaking of fluid.   The following portions of the patient's history were reviewed and updated as appropriate: allergies, current medications, past family history, past medical history, past social history, past surgical history and problem list.   Objective:  There were no vitals filed for this visit.  Fetal Status:  General:  Alert, oriented and cooperative. Patient is in no acute distress.  Respiratory: Normal respiratory effort, no problems with respiration noted  Mental Status: Normal mood and affect. Normal behavior. Normal judgment and thought content.  Rest of physical exam deferred due to type of  encounter   Assessment and Plan:  Pregnancy: G1P0 at [redacted]w[redacted]d 1. Supervision of high risk pregnancy, antepartum +FM  2. Acardiac twin during pregnancy, antepartum, single or unspecified fetus  3. Infection in pregnancy, antepartum review at 36 weeks  4. Hypothyroid in pregnancy, antepartum Dose changed 151mcg.    5. Advanced maternal age, primigravida, antepartum Taking baby ASA  Preterm labor symptoms and general obstetric precautions including but not limited to vaginal bleeding, contractions, leaking of fluid and fetal movement were reviewed in detail with the patient. I discussed the assessment and treatment plan with the patient. The patient was provided an opportunity to ask questions and all were answered. The patient agreed with the plan and demonstrated an understanding of the instructions. The patient was advised to call back or seek an in-person office evaluation/go to [facility]U at [facility] for any urgent or concerning symptoms. Please refer to After Visit Summary for other counseling recommendations.   I provided 12 minutes of face-to-face time during this encounter.  No follow-ups on file.  Future Appointments  Date Time Provider [place]  03/23/2019  3:00 PM [place] MFC-US  03/23/2019  3:00 PM [place] 3 WH-MFCUS MFC-US  03/30/2019  2:00 PM [name], MD [facility] None    [name], MD [facility] [place]

## 2019-03-23 ENCOUNTER — Other Ambulatory Visit: Payer: Self-pay

## 2019-03-23 ENCOUNTER — Ambulatory Visit (HOSPITAL_COMMUNITY): Payer: 59 | Admitting: *Deleted

## 2019-03-23 ENCOUNTER — Ambulatory Visit (HOSPITAL_COMMUNITY)
Admission: RE | Admit: 2019-03-23 | Discharge: 2019-03-23 | Disposition: A | Discharge status: Home / Self Care | Payer: 59 | Source: Ambulatory Visit | Attending: Obstetrics and Gynecology | Admitting: Obstetrics and Gynecology

## 2019-03-23 ENCOUNTER — Encounter (HOSPITAL_COMMUNITY): Payer: Self-pay

## 2019-03-23 ENCOUNTER — Other Ambulatory Visit (HOSPITAL_COMMUNITY): Payer: Self-pay | Admitting: *Deleted

## 2019-03-23 DIAGNOSIS — Z362 Encounter for other antenatal screening follow-up: Secondary | ICD-10-CM

## 2019-03-23 DIAGNOSIS — E039 Hypothyroidism, unspecified: Secondary | ICD-10-CM | POA: Insufficient documentation

## 2019-03-23 DIAGNOSIS — Z3A28 28 weeks gestation of pregnancy: Secondary | ICD-10-CM

## 2019-03-23 DIAGNOSIS — O09519 Supervision of elderly primigravida, unspecified trimester: Secondary | ICD-10-CM | POA: Insufficient documentation

## 2019-03-23 DIAGNOSIS — O30003 Twin pregnancy, unspecified number of placenta and unspecified number of amniotic sacs, third trimester: Secondary | ICD-10-CM | POA: Diagnosis not present

## 2019-03-23 DIAGNOSIS — O3113X0 Continuing pregnancy after spontaneous abortion of one fetus or more, third trimester, not applicable or unspecified: Secondary | ICD-10-CM

## 2019-03-23 DIAGNOSIS — O358XX0 Maternal care for other (suspected) fetal abnormality and damage, not applicable or unspecified: Secondary | ICD-10-CM | POA: Insufficient documentation

## 2019-03-23 DIAGNOSIS — O0990 Supervision of high risk pregnancy, unspecified, unspecified trimester: Secondary | ICD-10-CM

## 2019-03-23 DIAGNOSIS — O98919 Unspecified maternal infectious and parasitic disease complicating pregnancy, unspecified trimester: Secondary | ICD-10-CM | POA: Diagnosis not present

## 2019-03-23 DIAGNOSIS — O99283 Endocrine, nutritional and metabolic diseases complicating pregnancy, third trimester: Secondary | ICD-10-CM | POA: Insufficient documentation

## 2019-03-30 ENCOUNTER — Encounter: Payer: Self-pay | Admitting: Obstetrics & Gynecology

## 2019-03-30 ENCOUNTER — Telehealth (INDEPENDENT_AMBULATORY_CARE_PROVIDER_SITE_OTHER): Payer: 59 | Admitting: Obstetrics & Gynecology

## 2019-03-30 VITALS — BP 111/75 | HR 66

## 2019-03-30 DIAGNOSIS — E039 Hypothyroidism, unspecified: Secondary | ICD-10-CM

## 2019-03-30 DIAGNOSIS — O09513 Supervision of elderly primigravida, third trimester: Secondary | ICD-10-CM

## 2019-03-30 DIAGNOSIS — O09519 Supervision of elderly primigravida, unspecified trimester: Secondary | ICD-10-CM

## 2019-03-30 DIAGNOSIS — O98919 Unspecified maternal infectious and parasitic disease complicating pregnancy, unspecified trimester: Secondary | ICD-10-CM

## 2019-03-30 DIAGNOSIS — O99280 Endocrine, nutritional and metabolic diseases complicating pregnancy, unspecified trimester: Secondary | ICD-10-CM

## 2019-03-30 DIAGNOSIS — O0993 Supervision of high risk pregnancy, unspecified, third trimester: Secondary | ICD-10-CM

## 2019-03-30 DIAGNOSIS — Z3A29 29 weeks gestation of pregnancy: Secondary | ICD-10-CM

## 2019-03-30 DIAGNOSIS — O0990 Supervision of high risk pregnancy, unspecified, unspecified trimester: Secondary | ICD-10-CM

## 2019-03-30 DIAGNOSIS — O358XX9 Maternal care for other (suspected) fetal abnormality and damage, other fetus: Secondary | ICD-10-CM

## 2019-03-30 DIAGNOSIS — O99283 Endocrine, nutritional and metabolic diseases complicating pregnancy, third trimester: Secondary | ICD-10-CM

## 2019-03-30 DIAGNOSIS — O98913 Unspecified maternal infectious and parasitic disease complicating pregnancy, third trimester: Secondary | ICD-10-CM

## 2019-03-30 NOTE — Progress Notes (Signed)
TELEHEALTH OBSTETRICS PRENATAL VIRTUAL VIDEO VISIT ENCOUNTER NOTE  Provider location: [facility] [employer] [facility] [place] connected with [name] on 03/30/19 at  2:00 PM EST by MyChart Video Encounter at home and verified that I am speaking with the correct person using two identifiers.   I discussed the limitations, risks, security and privacy concerns of performing an evaluation and management service virtually and the availability of in person appointments. I also discussed with the patient that there may be a patient responsible charge related to this service. The patient expressed understanding and agreed to proceed. Subjective:  Tina [name] is a 38 y.o. G1P0 at [redacted]w[redacted]d being seen today for ongoing prenatal care.  She is currently monitored for the following issues for this high-risk pregnancy and has Hypothyroid in pregnancy, antepartum; History of rectal bleeding; Nevus of scalp; Neoplasm of uncertain behavior of skin; Melasma; Nevus spilus; Paresthesias; Supervision of high risk pregnancy, antepartum; Advanced maternal age, primigravida, antepartum; Twin reversed-arterial perfusion (TRAP) sequence pregnancy, antepartum; and Infection in pregnancy, antepartum on their problem list.  Patient reports no complaints. No bleeding or LOF or ctx.  Denies any leaking of fluid.   The following portions of the patient's history were reviewed and updated as appropriate: allergies, current medications, past family history, past medical history, past social history, past surgical history and problem list.   Objective:   Vitals:   03/30/19 1304 03/30/19 1305  BP: 94/71 111/75  Pulse: 60 66    Fetal Status: good  General:  Alert, oriented and cooperative. Patient is in no acute distress.  Respiratory: Normal respiratory effort, no problems with respiration noted  Mental Status: Normal mood and affect. Normal behavior. Normal judgment and thought content.    Rest of physical exam deferred due to type of encounter  Imaging: [place] Mfm Ob Follow Up  Result Date: 03/23/2019 ----------------------------------------------------------------------  OBSTETRICS REPORT                       (Signed Final 03/23/2019 05:43 pm) ---------------------------------------------------------------------- Patient Info  ID #:       [id number]                          D.O.B.:  24-Apr-1980 (38 yrs)  Name:       Tina [name]               Visit Date: 03/23/2019 03:25 pm ---------------------------------------------------------------------- Performed By  Performed By:     Valda Favia          Ref. Address:     [address]                    [place],  [place]                                                             [id number]  Attending:        Johnell Comings MD         [facility]  Referred By:      Osborne Oman MD ---------------------------------------------------------------------- Orders   #  Description                          Code         Ordered By   1  [place] MFM OB FOLLOW UP                  [id number]     Tama High  ----------------------------------------------------------------------   #  Order #                    Accession #                 Episode #   1  [id number]                  [id number]                  [id number]  ---------------------------------------------------------------------- Indications   Twin pregnancy with loss (demise) of one       O46.009, O31.10X0   fetus, antepartum (TRAP)   Advanced maternal age primigravida 47+,        O41.513   third trimester   [redacted] weeks gestation of pregnancy                Z3A.28   Encounter for antenatal screening for          Z36.3   malformations   Hypothyroid (Synthroid)                         O99.280 E03.9  ---------------------------------------------------------------------- Fetal Evaluation (Fetus A)  Num Of Fetuses:         2  Fetal Heart Rate(bpm):  148  Cardiac Activity:       Observed  Presentation:           Breech  Placenta:               Posterior  P. Cord Insertion:      Marginal insertion  Amniotic Fluid  AFI FV:      Within normal limits  AFI Sum(cm)     %Tile       Largest Pocket(cm)  12.38           32          5.24  RUQ(cm)  RLQ(cm)       LUQ(cm)        LLQ(cm)  4.12          3.02          5.24           0 ---------------------------------------------------------------------- Biometry (Fetus A)  BPD:      74.6  mm     G. Age:  29w 6d         76  %    CI:        75.57   %    70 - 86                                                          FL/HC:      19.7   %    19.6 - 20.8  HC:      272.1  mm     G. Age:  29w 5d         47  %    HC/AC:      1.05        0.99 - 1.21  AC:      259.6  mm     G. Age:  30w 1d         83  %    FL/BPD:     71.8   %    71 - 87  FL:       53.6  mm     G. Age:  28w 3d         27  %    FL/AC:      20.6   %    20 - 24  HUM:      47.5  mm     G. Age:  27w 6d         28  %  Est. FW:    1406  gm      3 lb 2 oz     68  %     FW Discordancy        0 \ % ---------------------------------------------------------------------- OB History  Gravidity:    1         Term:   0        Prem:   0        SAB:   0  TOP:          0       Ectopic:  0        Living: 0 ---------------------------------------------------------------------- Gestational Age (Fetus A)  LMP:           28w 5d        Date:  09/03/18                 EDD:   06/10/19  U/S Today:     29w 4d                                        EDD:   06/04/19  Best:          28w 5d     Det. By:  LMP  (09/03/18)  EDD:   06/10/19 ---------------------------------------------------------------------- Anatomy (Fetus A)  Cranium:               Appears normal         LVOT:                   Previously seen  Cavum:                  Previously seen        Aortic Arch:            Previously seen  Ventricles:            Appears normal         Ductal Arch:            Previously seen  Choroid Plexus:        Previously seen        Diaphragm:              Previously seen  Cerebellum:            Previously seen        Stomach:                Appears normal, left                                                                        sided  Posterior Fossa:       Previously seen        Abdomen:                Previously seen  Nuchal Fold:           Previously seen        Abdominal Wall:         Previously seen  Face:                  Orbits and profile     Cord Vessels:           Previously seen                         previously seen  Lips:                  Not well visualized    Kidneys:                Previously seen  Palate:                Appears normal         Bladder:                Appears normal  Thoracic:              Appears normal         Spine:                  Appears normal  Heart:                 Appears normal         Upper Extremities:      Previously seen                         (  4CH, axis, and                         situs)  RVOT:                  Previously seen        Lower Extremities:      Previously seen  Other:  Heels and right open hand/5th digit visualized previously. Nasal bone          visualized previously. Technically difficult due to fetal position. ---------------------------------------------------------------------- Fetal Evaluation (Fetus B)  Num Of Fetuses:         2 ---------------------------------------------------------------------- Gestational Age (Fetus B)  LMP:           28w 5d        Date:  09/03/18                 EDD:   06/10/19  Best:          Timothy Lasso 5d     Det. By:  LMP  (09/03/18)          EDD:   06/10/19 ---------------------------------------------------------------------- Cervix Uterus Adnexa  Cervix  Not visualized (advanced GA >24wks)  Uterus  No abnormality visualized.  Left Ovary  No adnexal  mass visualized.  Right Ovary  No adnexal mass visualized.  Cul De Sac  No free fluid seen.  Adnexa  No abnormality visualized. ---------------------------------------------------------------------- Comments  This patient was seen for a follow up growth scan due to a  pregnancy that initially started out as a monochorionic twin  gestation with an acardiac twin.  The acardiac twin has not  been visualized on her subsequent ultrasound exams.  She  denies any problems since her last exam.  She was informed that the fetal growth and amniotic fluid  level appears appropriate for her gestational age.  There were no signs of fetal hydrops noted today.  The  acardiac twin was not visualized today.  A follow up exam was scheduled in 4 weeks. ----------------------------------------------------------------------                   Johnell Comings, MD Electronically Signed Final Report   03/23/2019 05:43 pm ----------------------------------------------------------------------   Assessment and Plan:  Pregnancy: G1P0 at [redacted]w[redacted]d 1. Supervision of high risk pregnancy, antepartum Good FM   2. Acardiac twin during pregnancy, antepartum, fetus 6 or greater S/p resolution   3. Hypothyroid in pregnancy, antepartum Need to repeat levels at next visit  4. Advanced maternal age, primigravida, antepartum On baby ASA  5. Infection in pregnancy, antepartum Begin atbx   Preterm labor symptoms and general obstetric precautions including but not limited to vaginal bleeding, contractions, leaking of fluid and fetal movement were reviewed in detail with the patient. I discussed the assessment and treatment plan with the patient. The patient was provided an opportunity to ask questions and all were answered. The patient agreed with the plan and demonstrated an understanding of the instructions. The patient was advised to call back or seek an in-person office evaluation/go to MAU at [facility] for any urgent or  concerning symptoms. Please refer to After Visit Summary for other counseling recommendations.   I provided 10 minutes of face-to-face time during this encounter.  No follow-ups on file.  Future Appointments  Date Time Provider [place]  04/13/2019  1:15 PM [name], MD [facility] None  04/21/2019  9:30 AM WH-MFC [place] 1 WH-MFCUS MFC-US  04/21/2019  9:40 AM [facility]-MFC NURSE [facility]-MFC MFC-US    [name], MD Center for [facility], [place]

## 2019-04-13 ENCOUNTER — Other Ambulatory Visit: Payer: Self-pay

## 2019-04-13 ENCOUNTER — Ambulatory Visit (INDEPENDENT_AMBULATORY_CARE_PROVIDER_SITE_OTHER): Payer: 59 | Admitting: Obstetrics & Gynecology

## 2019-04-13 VITALS — BP 110/73 | HR 67 | Wt 221.0 lb

## 2019-04-13 DIAGNOSIS — O98919 Unspecified maternal infectious and parasitic disease complicating pregnancy, unspecified trimester: Secondary | ICD-10-CM

## 2019-04-13 DIAGNOSIS — E039 Hypothyroidism, unspecified: Secondary | ICD-10-CM | POA: Diagnosis not present

## 2019-04-13 DIAGNOSIS — O99280 Endocrine, nutritional and metabolic diseases complicating pregnancy, unspecified trimester: Secondary | ICD-10-CM | POA: Diagnosis not present

## 2019-04-13 DIAGNOSIS — O98913 Unspecified maternal infectious and parasitic disease complicating pregnancy, third trimester: Secondary | ICD-10-CM

## 2019-04-13 DIAGNOSIS — O0990 Supervision of high risk pregnancy, unspecified, unspecified trimester: Secondary | ICD-10-CM

## 2019-04-13 DIAGNOSIS — O09513 Supervision of elderly primigravida, third trimester: Secondary | ICD-10-CM

## 2019-04-13 DIAGNOSIS — Z3A31 31 weeks gestation of pregnancy: Secondary | ICD-10-CM

## 2019-04-13 DIAGNOSIS — O358XX1 Maternal care for other (suspected) fetal abnormality and damage, fetus 1: Secondary | ICD-10-CM

## 2019-04-13 DIAGNOSIS — O0993 Supervision of high risk pregnancy, unspecified, third trimester: Secondary | ICD-10-CM

## 2019-04-13 DIAGNOSIS — O09519 Supervision of elderly primigravida, unspecified trimester: Secondary | ICD-10-CM

## 2019-04-13 DIAGNOSIS — O99283 Endocrine, nutritional and metabolic diseases complicating pregnancy, third trimester: Secondary | ICD-10-CM

## 2019-04-13 NOTE — Progress Notes (Signed)
   PRENATAL VISIT NOTE  Subjective:  Tina [name] is a 38 y.o. G1P0 at [redacted]w[redacted]d being seen today for ongoing prenatal care.  She is currently monitored for the following issues for this high-risk pregnancy and has Hypothyroid in pregnancy, antepartum; History of rectal bleeding; Nevus of scalp; Neoplasm of uncertain behavior of skin; Melasma; Nevus spilus; Paresthesias; Supervision of high risk pregnancy, antepartum; Advanced maternal age, primigravida, antepartum; Twin reversed-arterial perfusion (TRAP) sequence pregnancy, antepartum; and Infection in pregnancy, antepartum on their problem list.  Patient reports no complaints.  Contractions: Not present. Vag. Bleeding: None.  Movement: Present. Denies leaking of fluid.   The following portions of the patient's history were reviewed and updated as appropriate: allergies, current medications, past family history, past medical history, past social history, past surgical history and problem list.   Objective:   Vitals:   04/13/19 1302  BP: 110/73  Pulse: 67  Weight: 221 lb (100.2 kg)    Fetal Status: Fetal Heart Rate (bpm): 130   Movement: Present     General:  Alert, oriented and cooperative. Patient is in no acute distress.  Skin: Skin is warm and dry. No rash noted.   Cardiovascular: Normal heart rate noted  Respiratory: Normal respiratory effort, no problems with respiration noted  Abdomen: Soft, gravid, appropriate for gestational age.  Pain/Pressure: Present     Pelvic: Cervical exam deferred        Extremities: Normal range of motion.  Edema: None  Mental Status: Normal mood and affect. Normal behavior. Normal judgment and thought content.   Assessment and Plan:  Pregnancy: G1P0 at [redacted]w[redacted]d 1. Supervision of high risk pregnancy, antepartum FH WNL  Discussed COVID vaccine. Pt will hold off for now. Will get PP  - TSH - T4, free - T3, free  2. High risk pregnancy, antepartum  - TSH - T4, free - T3, free  3. Hypothyroid  in pregnancy, antepartum - TSH - T4, free - T3, free  4. Acardiac twin during pregnancy, antepartum, fetus 1 of multiple gestation Has f/u US next week  5. Infection in pregnancy, antepartum Begin meds at 36 weeks  6. Advanced maternal age, primigravida, antepartum On baby ASA  Preterm labor symptoms and general obstetric precautions including but not limited to vaginal bleeding, contractions, leaking of fluid and fetal movement were reviewed in detail with the patient. Please refer to After Visit Summary for other counseling recommendations.   Return in about 2 weeks (around 04/27/2019) for in person.  Future Appointments  Date Time Provider [place]  04/21/2019  9:30 AM [facility]-MFC [place] 1 WH-MFCUS MFC-US  04/21/2019  9:40 AM [place] MFC-US    [name], MD

## 2019-04-14 LAB — T3, FREE: T3, Free: 2.6 pg/mL (ref 2.0–4.4)

## 2019-04-14 LAB — TSH: TSH: 2.58 u[IU]/mL (ref 0.450–4.500)

## 2019-04-14 LAB — T4, FREE: Free T4: 1.29 ng/dL (ref 0.82–1.77)

## 2019-04-21 ENCOUNTER — Ambulatory Visit (HOSPITAL_COMMUNITY)
Admission: RE | Admit: 2019-04-21 | Discharge: 2019-04-21 | Disposition: A | Discharge status: Home / Self Care | Payer: 59 | Source: Ambulatory Visit | Attending: Obstetrics and Gynecology | Admitting: Obstetrics and Gynecology

## 2019-04-21 ENCOUNTER — Other Ambulatory Visit: Payer: Self-pay

## 2019-04-21 ENCOUNTER — Ambulatory Visit (HOSPITAL_COMMUNITY): Payer: 59 | Admitting: *Deleted

## 2019-04-21 ENCOUNTER — Encounter (HOSPITAL_COMMUNITY): Payer: Self-pay

## 2019-04-21 DIAGNOSIS — O99283 Endocrine, nutritional and metabolic diseases complicating pregnancy, third trimester: Secondary | ICD-10-CM | POA: Diagnosis not present

## 2019-04-21 DIAGNOSIS — O3113X0 Continuing pregnancy after spontaneous abortion of one fetus or more, third trimester, not applicable or unspecified: Secondary | ICD-10-CM

## 2019-04-21 DIAGNOSIS — E039 Hypothyroidism, unspecified: Secondary | ICD-10-CM | POA: Diagnosis not present

## 2019-04-21 DIAGNOSIS — Z3A32 32 weeks gestation of pregnancy: Secondary | ICD-10-CM

## 2019-04-21 DIAGNOSIS — O09519 Supervision of elderly primigravida, unspecified trimester: Secondary | ICD-10-CM

## 2019-04-21 DIAGNOSIS — O30003 Twin pregnancy, unspecified number of placenta and unspecified number of amniotic sacs, third trimester: Secondary | ICD-10-CM | POA: Diagnosis not present

## 2019-04-21 DIAGNOSIS — O98919 Unspecified maternal infectious and parasitic disease complicating pregnancy, unspecified trimester: Secondary | ICD-10-CM | POA: Diagnosis not present

## 2019-04-21 DIAGNOSIS — Z362 Encounter for other antenatal screening follow-up: Secondary | ICD-10-CM | POA: Diagnosis not present

## 2019-04-21 DIAGNOSIS — O09513 Supervision of elderly primigravida, third trimester: Secondary | ICD-10-CM | POA: Diagnosis not present

## 2019-04-21 DIAGNOSIS — O0990 Supervision of high risk pregnancy, unspecified, unspecified trimester: Secondary | ICD-10-CM | POA: Diagnosis not present

## 2019-04-22 NOTE — L&D Delivery Note (Signed)
  LABOR COURSE Pt presented for IOL. She is s/p a foley bulb. She is s/p AROM at insertion. Low dose pitocin was started at the time of insertion. Pitocin was started and she received an epidural.  She progressed to C/C.      Delivery Note Called to room and patient was complete and pushing. Head delivered ROA. No nuchal cord present. Shoulder and body delivered in usual fashion. At  6:48pm viable female was delivered via Vaginal, Spontaneous.  Infant with spontaneous cry, placed on mother's abdomen, dried and stimulated. Cord clamped x 2 after 3 minute delay, and cut by husband. Cord blood drawn. Placenta delivered spontaneously with gentle cord traction. Appears intact. Fundus firm with massage and Pitocin. Labia, perineum, vagina, and cervix inspected and intact.    APGAR: 9/9 weight pending  .   Cord: 3VC with the following complications: none .    Anesthesia:  Epidural  Episiotomy: None Lacerations: 2nd degree Suture Repair: 3.0 vicryl Est. Blood Loss (mL): 100cc  Mom to postpartum.  Baby to Couplet care / Skin to Skin.  Ledon Weihe L. Harraway-Smith, M.D., Cherlynn June

## 2019-04-25 ENCOUNTER — Other Ambulatory Visit (HOSPITAL_COMMUNITY): Payer: Self-pay | Admitting: *Deleted

## 2019-04-25 ENCOUNTER — Telehealth (INDEPENDENT_AMBULATORY_CARE_PROVIDER_SITE_OTHER): Payer: 59 | Admitting: Obstetrics & Gynecology

## 2019-04-25 ENCOUNTER — Encounter: Payer: Self-pay | Admitting: Obstetrics & Gynecology

## 2019-04-25 VITALS — BP 113/74 | HR 72

## 2019-04-25 DIAGNOSIS — O358XX0 Maternal care for other (suspected) fetal abnormality and damage, not applicable or unspecified: Secondary | ICD-10-CM

## 2019-04-25 DIAGNOSIS — O09523 Supervision of elderly multigravida, third trimester: Secondary | ICD-10-CM

## 2019-04-25 DIAGNOSIS — Z3A33 33 weeks gestation of pregnancy: Secondary | ICD-10-CM

## 2019-04-25 DIAGNOSIS — O09519 Supervision of elderly primigravida, unspecified trimester: Secondary | ICD-10-CM

## 2019-04-25 DIAGNOSIS — O0990 Supervision of high risk pregnancy, unspecified, unspecified trimester: Secondary | ICD-10-CM

## 2019-04-25 DIAGNOSIS — O98919 Unspecified maternal infectious and parasitic disease complicating pregnancy, unspecified trimester: Secondary | ICD-10-CM

## 2019-04-25 DIAGNOSIS — O99283 Endocrine, nutritional and metabolic diseases complicating pregnancy, third trimester: Secondary | ICD-10-CM

## 2019-04-25 DIAGNOSIS — O0993 Supervision of high risk pregnancy, unspecified, third trimester: Secondary | ICD-10-CM

## 2019-04-25 DIAGNOSIS — O98913 Unspecified maternal infectious and parasitic disease complicating pregnancy, third trimester: Secondary | ICD-10-CM

## 2019-04-25 DIAGNOSIS — O99280 Endocrine, nutritional and metabolic diseases complicating pregnancy, unspecified trimester: Secondary | ICD-10-CM

## 2019-04-25 DIAGNOSIS — E039 Hypothyroidism, unspecified: Secondary | ICD-10-CM

## 2019-04-25 DIAGNOSIS — O09513 Supervision of elderly primigravida, third trimester: Secondary | ICD-10-CM

## 2019-04-25 NOTE — Progress Notes (Signed)
TELEHEALTH OBSTETRICS PRENATAL VIRTUAL VIDEO VISIT ENCOUNTER NOTE  Provider location: [facility] [place] [facility] [place] connected with Tina [name] on 04/25/19 at 10:30 AM EST by MyChart Video Encounter at home and verified that I am speaking with the correct person using two identifiers.   I discussed the limitations, risks, security and privacy concerns of performing an evaluation and management service virtually and the availability of in person appointments. I also discussed with the patient that there may be a patient responsible charge related to this service. The patient expressed understanding and agreed to proceed. Subjective:  Tina [name] is a 39 y.o. G1P0 at [redacted]w[redacted]d being seen today for ongoing prenatal care.  She is currently monitored for the following issues for this high-risk pregnancy and has Hypothyroid in pregnancy, antepartum; History of rectal bleeding; Nevus of scalp; Neoplasm of uncertain behavior of skin; Melasma; Nevus spilus; Paresthesias; Supervision of high risk pregnancy, antepartum; Advanced maternal age, primigravida, antepartum; Twin reversed-arterial perfusion (TRAP) sequence pregnancy, antepartum; and Infection in pregnancy, antepartum on their problem list.  Patient reports occasional contractions.  Contractions: Not present. Vag. Bleeding: None.  Movement: Present. Denies any leaking of fluid. Pt reports possible exposure to Hep C.   The following portions of the patient's history were reviewed and updated as appropriate: allergies, current medications, past family history, past medical history, past social history, past surgical history and problem list.   Objective:   Vitals:   04/25/19 1030  BP: 113/74  Pulse: 72    Fetal Status:     Movement: Present     General:  Alert, oriented and cooperative. Patient is in no acute distress.  Respiratory: Normal respiratory effort, no problems with respiration noted  Mental  Status: Normal mood and affect. Normal behavior. Normal judgment and thought content.  Rest of physical exam deferred due to type of encounter  Imaging: [place] MFM OB FOLLOW UP  Result Date: 04/21/2019 ----------------------------------------------------------------------  OBSTETRICS REPORT                       (Signed Final 04/21/2019 10:49 am) ---------------------------------------------------------------------- Patient Info  ID #:       [id number]                          D.O.B.:  1980/08/03 (39 yrs)  Name:       Tina [name]               Visit Date: 04/21/2019 09:59 am ---------------------------------------------------------------------- Performed By  Performed By:     Berlinda Last          Ref. Address:     [address]  [place], [place]  Attending:        Tama High MD        [facility]  Referred By:      Osborne Oman MD ---------------------------------------------------------------------- Orders   #  Description                          Code         Ordered By   1  [place] MFM OB FOLLOW UP                  [id number]     Peterson Ao  ----------------------------------------------------------------------   #  Order #                    Accession #                 Episode #   1  [id number]                  [id number]                  [id number]  ---------------------------------------------------------------------- Indications   Twin pregnancy with loss (demise) of one       O50.009, O31.10X0   fetus, antepartum (TRAP)   [redacted] weeks gestation of pregnancy                Z3A.5   Advanced maternal age primigravida 32+,        O40.513   third trimester   Hypothyroid (Synthroid)                         O99.280 E03.9  ---------------------------------------------------------------------- Fetal Evaluation (Fetus A)  Num Of Fetuses:         2  Fetal Heart Rate(bpm):  130  Cardiac Activity:       Observed  Presentation:           Cephalic  Placenta:               Posterior  P. Cord Insertion:      Marginal insertion  Amniotic Fluid  AFI FV:      Within normal limits  AFI Sum(cm)     %Tile       Largest Pocket(cm)  10.08           18          2.96  RUQ(cm)       RLQ(cm)       LUQ(cm)  LLQ(cm)  2.55          2.43          2.14           2.96 ---------------------------------------------------------------------- Biometry (Fetus A)  BPD:      81.4  mm     G. Age:  32w 5d         39  %    CI:        73.79   %    70 - 86                                                          FL/HC:      20.8   %    19.9 - 21.5  HC:       301   mm     G. Age:  33w 3d         26  %    HC/AC:      1.01        0.96 - 1.11  AC:      297.7  mm     G. Age:  33w 5d         76  %    FL/BPD:     76.9   %    71 - 87  FL:       62.6  mm     G. Age:  32w 3d         26  %    FL/AC:      21.0   %    20 - 24  Est. FW:    2149  gm    4 lb 12 oz      52  %     FW Discordancy        0 \ % ---------------------------------------------------------------------- OB History  Gravidity:    1         Term:   0        Prem:   0        SAB:   0  TOP:          0       Ectopic:  0        Living: 0 ---------------------------------------------------------------------- Gestational Age (Fetus A)  LMP:           32w 6d        Date:  09/03/18                 EDD:   06/10/19  U/S Today:     33w 1d                                        EDD:   06/08/19  Best:          32w 6d     Det. By:  LMP  (09/03/18)          EDD:   06/10/19 ---------------------------------------------------------------------- Anatomy (Fetus A)  Cranium:               Appears normal         LVOT:  Appears normal  Cavum:                 Previously seen        Aortic  Arch:            Previously seen  Ventricles:            Appears normal         Ductal Arch:            Previously seen  Choroid Plexus:        Previously seen        Diaphragm:              Appears normal  Cerebellum:            Previously seen        Stomach:                Appears normal, left                                                                        sided  Posterior Fossa:       Previously seen        Abdomen:                Previously seen  Nuchal Fold:           Previously seen        Abdominal Wall:         Previously seen  Face:                  Orbits and profile     Cord Vessels:           Previously seen                         previously seen  Lips:                  Appears normal         Kidneys:                Appear normal  Palate:                Previously seen        Bladder:                Appears normal  Thoracic:              Appears normal         Spine:                  Previously seen  Heart:                 Previously seen        Upper Extremities:      Previously seen  RVOT:                  Appears normal         Lower Extremities:      Previously seen  Other:  Heels and right open hand/5th digit visualized previously. Nasal bone          visualized  previously. Technically difficult due to fetal position. ---------------------------------------------------------------------- Fetal Evaluation (Fetus B)  Num Of Fetuses:         2 ---------------------------------------------------------------------- Gestational Age (Fetus B)  LMP:           32w 6d        Date:  09/03/18                 EDD:   06/10/19  Best:          Milderd Meager 6d     Det. By:  LMP  (09/03/18)          EDD:   06/10/19 ---------------------------------------------------------------------- Cervix Uterus Adnexa  Cervix  Not visualized (advanced GA >24wks) ---------------------------------------------------------------------- Impression  Patient return for fetal growth assessment.  Marginal cord  insertion was detected on  earlier scans.  Her pregnancy  began with monochorionic-diamniotic twin pregnancy with  TRAP sequence that resolved without intervention.  She has hypothyroidism and takes levothyroxine 150  micrograms daily.  Amniotic fluid is normal and good fetal activity is seen. Fetal  growth is appropriate for gestational age. ---------------------------------------------------------------------- Recommendations  -An appointment was made for her to return in 4 weeks for  fetal growth assessment. ----------------------------------------------------------------------                  Tama High, MD Electronically Signed Final Report   04/21/2019 10:49 am ----------------------------------------------------------------------   Assessment and Plan:  Pregnancy: G1P0 at [redacted]w[redacted]d 1. Supervision of high risk pregnancy, antepartum Good FM   2. Acardiac twin during pregnancy, antepartum, single or unspecified fetus 04/20/2020  Est. FW:    2149  gm    4 lb 12 oz      52  %     FW Discordancy        0 \ %  3. Infection in pregnancy, antepartum Begin meds next visit  4. Hypothyroid in pregnancy, antepartum TSH WNL  5. Advanced maternal age, primigravida, antepartum Pt is on baby ASA  Preterm labor symptoms and general obstetric precautions including but not limited to vaginal bleeding, contractions, leaking of fluid and fetal movement were reviewed in detail with the patient. I discussed the assessment and treatment plan with the patient. The patient was provided an opportunity to ask questions and all were answered. The patient agreed with the plan and demonstrated an understanding of the instructions. The patient was advised to call back or seek an in-person office evaluation/go to MAU at [facility] for any urgent or concerning symptoms. Please refer to After Visit Summary for other counseling recommendations.   I provided 12 minutes of face-to-face time during this encounter.  No follow-ups on  file.  Future Appointments  Date Time Provider [place]  05/20/2019  8:00 AM WH-MFC [place] 3 WH-MFCUS MFC-US    Gildo Crisco Harraway-Smith, [place] for [facility], [place]

## 2019-05-16 ENCOUNTER — Ambulatory Visit (INDEPENDENT_AMBULATORY_CARE_PROVIDER_SITE_OTHER): Payer: 59 | Admitting: Obstetrics & Gynecology

## 2019-05-16 ENCOUNTER — Other Ambulatory Visit: Payer: Self-pay

## 2019-05-16 VITALS — BP 108/75 | HR 67 | Wt 233.0 lb

## 2019-05-16 DIAGNOSIS — O98919 Unspecified maternal infectious and parasitic disease complicating pregnancy, unspecified trimester: Secondary | ICD-10-CM

## 2019-05-16 DIAGNOSIS — O98913 Unspecified maternal infectious and parasitic disease complicating pregnancy, third trimester: Secondary | ICD-10-CM

## 2019-05-16 DIAGNOSIS — O0990 Supervision of high risk pregnancy, unspecified, unspecified trimester: Secondary | ICD-10-CM

## 2019-05-16 DIAGNOSIS — E039 Hypothyroidism, unspecified: Secondary | ICD-10-CM

## 2019-05-16 DIAGNOSIS — O09519 Supervision of elderly primigravida, unspecified trimester: Secondary | ICD-10-CM

## 2019-05-16 DIAGNOSIS — O99283 Endocrine, nutritional and metabolic diseases complicating pregnancy, third trimester: Secondary | ICD-10-CM

## 2019-05-16 DIAGNOSIS — Z113 Encounter for screening for infections with a predominantly sexual mode of transmission: Secondary | ICD-10-CM | POA: Diagnosis not present

## 2019-05-16 DIAGNOSIS — O0993 Supervision of high risk pregnancy, unspecified, third trimester: Secondary | ICD-10-CM

## 2019-05-16 DIAGNOSIS — O358XX0 Maternal care for other (suspected) fetal abnormality and damage, not applicable or unspecified: Secondary | ICD-10-CM

## 2019-05-16 DIAGNOSIS — Z3A36 36 weeks gestation of pregnancy: Secondary | ICD-10-CM

## 2019-05-16 DIAGNOSIS — O99280 Endocrine, nutritional and metabolic diseases complicating pregnancy, unspecified trimester: Secondary | ICD-10-CM

## 2019-05-16 DIAGNOSIS — O09513 Supervision of elderly primigravida, third trimester: Secondary | ICD-10-CM

## 2019-05-16 NOTE — Progress Notes (Signed)
   PRENATAL VISIT NOTE  Subjective:  Tina [name] is a 39 y.o. G1P0 at [redacted]w[redacted]d being seen today for ongoing prenatal care.  She is currently monitored for the following issues for this high-risk pregnancy and has Hypothyroid in pregnancy, antepartum; History of rectal bleeding; Nevus of scalp; Neoplasm of uncertain behavior of skin; Melasma; Nevus spilus; Paresthesias; Supervision of high risk pregnancy, antepartum; Advanced maternal age, primigravida, antepartum; Twin reversed-arterial perfusion (TRAP) sequence pregnancy, antepartum; and Infection in pregnancy, antepartum on their problem list.  Patient reports increased pressure. Occ ctx Denies leaking of fluid; VB or contractions.  +FM. Keeping intermittent [facility]  The following portions of the patient's history were reviewed and updated as appropriate: allergies, current medications, past family history, past medical history, past social history, past surgical history and problem list.   Objective:  BP 108/75   Pulse 67   Wt 233 lb (105.7 kg)   LMP 09/03/2018   BMI 34.41 kg/m  Fetal Status: good movement   General:  Alert, oriented and cooperative. Patient is in no acute distress.  Skin: Skin is warm and dry. No rash noted.   Cardiovascular: Normal heart rate noted  Respiratory: Normal respiratory effort, no problems with respiration noted  Abdomen: Soft, gravid, appropriate for gestational age.        Pelvic: Cervical exam performed        Extremities: Normal range of motion.     Mental Status: Normal mood and affect. Normal behavior. Normal judgment and thought content.   Assessment and Plan:  Pregnancy: G1P0 at [redacted]w[redacted]d 1. Supervision of high risk pregnancy, antepartum GBS and cx today  2. Acardiac twin during pregnancy, antepartum, single or unspecified fetus  3. Infection in pregnancy, antepartum On antiviral meds  4. Hypothyroid in pregnancy, antepartum Repeat panel today  5. Advanced maternal age, primigravida,  antepartum For IOL by 39 weeks   Preterm labor symptoms and general obstetric precautions including but not limited to vaginal bleeding, contractions, leaking of fluid and fetal movement were reviewed in detail with the patient. Please refer to After Visit Summary for other counseling recommendations.   No follow-ups on file.  Future Appointments  Date Time Provider [place]  05/20/2019  8:00 AM [place] [facility]  05/20/2019  8:00 AM [place] [facility]-M[facility]-US    [name], MD

## 2019-05-17 DIAGNOSIS — E039 Hypothyroidism, unspecified: Secondary | ICD-10-CM | POA: Diagnosis not present

## 2019-05-17 DIAGNOSIS — O99280 Endocrine, nutritional and metabolic diseases complicating pregnancy, unspecified trimester: Secondary | ICD-10-CM | POA: Diagnosis not present

## 2019-05-17 LAB — GC/CHLAMYDIA PROBE AMP (~~LOC~~) NOT AT ARMC
Chlamydia: NEGATIVE
Comment: NEGATIVE
Comment: NORMAL
Neisseria Gonorrhea: NEGATIVE

## 2019-05-18 LAB — THYROID PANEL WITH TSH
Free Thyroxine Index: 1.7 (ref 1.2–4.9)
T3 Uptake Ratio: 15 % — ABNORMAL LOW (ref 24–39)
T4, Total: 11.5 ug/dL (ref 4.5–12.0)
TSH: 4.47 u[IU]/mL (ref 0.450–4.500)

## 2019-05-20 ENCOUNTER — Ambulatory Visit (HOSPITAL_COMMUNITY)
Admission: RE | Admit: 2019-05-20 | Discharge: 2019-05-20 | Disposition: A | Discharge status: Home / Self Care | Payer: 59 | Source: Ambulatory Visit | Attending: Obstetrics and Gynecology | Admitting: Obstetrics and Gynecology

## 2019-05-20 ENCOUNTER — Ambulatory Visit (HOSPITAL_COMMUNITY): Payer: 59 | Admitting: *Deleted

## 2019-05-20 ENCOUNTER — Other Ambulatory Visit: Payer: Self-pay

## 2019-05-20 ENCOUNTER — Encounter (HOSPITAL_COMMUNITY): Payer: Self-pay

## 2019-05-20 DIAGNOSIS — O09519 Supervision of elderly primigravida, unspecified trimester: Secondary | ICD-10-CM

## 2019-05-20 DIAGNOSIS — O99283 Endocrine, nutritional and metabolic diseases complicating pregnancy, third trimester: Secondary | ICD-10-CM | POA: Diagnosis not present

## 2019-05-20 DIAGNOSIS — O98919 Unspecified maternal infectious and parasitic disease complicating pregnancy, unspecified trimester: Secondary | ICD-10-CM

## 2019-05-20 DIAGNOSIS — O30003 Twin pregnancy, unspecified number of placenta and unspecified number of amniotic sacs, third trimester: Secondary | ICD-10-CM | POA: Diagnosis not present

## 2019-05-20 DIAGNOSIS — O3113X0 Continuing pregnancy after spontaneous abortion of one fetus or more, third trimester, not applicable or unspecified: Secondary | ICD-10-CM | POA: Diagnosis not present

## 2019-05-20 DIAGNOSIS — Z362 Encounter for other antenatal screening follow-up: Secondary | ICD-10-CM | POA: Diagnosis not present

## 2019-05-20 DIAGNOSIS — Z3A37 37 weeks gestation of pregnancy: Secondary | ICD-10-CM | POA: Diagnosis not present

## 2019-05-20 DIAGNOSIS — E039 Hypothyroidism, unspecified: Secondary | ICD-10-CM

## 2019-05-20 DIAGNOSIS — O0990 Supervision of high risk pregnancy, unspecified, unspecified trimester: Secondary | ICD-10-CM | POA: Diagnosis not present

## 2019-05-20 DIAGNOSIS — O09523 Supervision of elderly multigravida, third trimester: Secondary | ICD-10-CM | POA: Diagnosis not present

## 2019-05-20 LAB — CULTURE, BETA STREP (GROUP B ONLY): Strep Gp B Culture: NEGATIVE

## 2019-05-23 ENCOUNTER — Ambulatory Visit (INDEPENDENT_AMBULATORY_CARE_PROVIDER_SITE_OTHER): Payer: 59 | Admitting: Obstetrics & Gynecology

## 2019-05-23 ENCOUNTER — Other Ambulatory Visit: Payer: Self-pay

## 2019-05-23 VITALS — BP 121/77 | HR 89 | Wt 233.0 lb

## 2019-05-23 DIAGNOSIS — O99280 Endocrine, nutritional and metabolic diseases complicating pregnancy, unspecified trimester: Secondary | ICD-10-CM

## 2019-05-23 DIAGNOSIS — Z3A37 37 weeks gestation of pregnancy: Secondary | ICD-10-CM

## 2019-05-23 DIAGNOSIS — O0990 Supervision of high risk pregnancy, unspecified, unspecified trimester: Secondary | ICD-10-CM

## 2019-05-23 DIAGNOSIS — E039 Hypothyroidism, unspecified: Secondary | ICD-10-CM | POA: Diagnosis not present

## 2019-05-23 DIAGNOSIS — O99283 Endocrine, nutritional and metabolic diseases complicating pregnancy, third trimester: Secondary | ICD-10-CM | POA: Diagnosis not present

## 2019-05-23 DIAGNOSIS — O358XX0 Maternal care for other (suspected) fetal abnormality and damage, not applicable or unspecified: Secondary | ICD-10-CM

## 2019-05-23 DIAGNOSIS — O09513 Supervision of elderly primigravida, third trimester: Secondary | ICD-10-CM | POA: Diagnosis not present

## 2019-05-23 DIAGNOSIS — O09519 Supervision of elderly primigravida, unspecified trimester: Secondary | ICD-10-CM

## 2019-05-23 DIAGNOSIS — O98913 Unspecified maternal infectious and parasitic disease complicating pregnancy, third trimester: Secondary | ICD-10-CM

## 2019-05-23 DIAGNOSIS — O0993 Supervision of high risk pregnancy, unspecified, third trimester: Secondary | ICD-10-CM

## 2019-05-23 DIAGNOSIS — O98919 Unspecified maternal infectious and parasitic disease complicating pregnancy, unspecified trimester: Secondary | ICD-10-CM

## 2019-05-23 NOTE — Progress Notes (Signed)
   PRENATAL VISIT NOTE  Subjective:  Tina [name] is a 39 y.o. G1P0 at [redacted]w[redacted]d being seen today for ongoing prenatal care.  She is currently monitored for the following issues for this high-risk pregnancy and has Hypothyroid in pregnancy, antepartum; History of rectal bleeding; Nevus of scalp; Neoplasm of uncertain behavior of skin; Melasma; Nevus spilus; Paresthesias; Supervision of high risk pregnancy, antepartum; Advanced maternal age, primigravida, antepartum; Twin reversed-arterial perfusion (TRAP) sequence pregnancy, antepartum; and Infection in pregnancy, antepartum on their problem list.  Patient reports no bleeding, no leaking and occasional contractions.  Contractions: Irregular. Vag. Bleeding: None.  Movement: Present. Denies leaking of fluid. Pt reports intermittent FM conerns.   The following portions of the patient's history were reviewed and updated as appropriate: allergies, current medications, past family history, past medical history, past social history, past surgical history and problem list.   Objective:   Vitals:   05/23/19 0817  BP: 121/77  Pulse: 89  Weight: 233 lb (105.7 kg)    Fetal Status: Fetal Heart Rate (bpm): NST Fundal Height: 34 cm Movement: Present     General:  Alert, oriented and cooperative. Patient is in no acute distress.  Skin: Skin is warm and dry. No rash noted.   Cardiovascular: Normal heart rate noted  Respiratory: Normal respiratory effort, no problems with respiration noted  Abdomen: Soft, gravid, appropriate for gestational age.  Pain/Pressure: Present     Pelvic: Cervical exam deferred        Extremities: Normal range of motion.  Edema: Trace  Mental Status: Normal mood and affect. Normal behavior. Normal judgment and thought content.   Assessment and Plan:  Pregnancy: G1P0 at [redacted]w[redacted]d 1. Supervision of high risk pregnancy, antepartum NST reviewed and reactive.  2. Acardiac twin during pregnancy, antepartum, single or unspecified  fetus For IOL at 39 weeks  3. Infection in pregnancy, antepartum On antivirals  4. Hypothyroid in pregnancy, antepartum stable  5. Advanced maternal age, primigravida, antepartum On baby ASA  Term labor symptoms and general obstetric precautions including but not limited to vaginal bleeding, contractions, leaking of fluid and fetal movement were reviewed in detail with the patient. Please refer to After Visit Summary for other counseling recommendations.   No follow-ups on file.  Future Appointments  Date Time Provider [place]  06/03/2019 12:00 AM [facility]-LD SCHED ROOM [facility]-IN[facility] None    [name], MD

## 2019-05-24 ENCOUNTER — Telehealth (HOSPITAL_COMMUNITY): Payer: Self-pay | Admitting: *Deleted

## 2019-05-26 ENCOUNTER — Other Ambulatory Visit (HOSPITAL_COMMUNITY): Payer: Self-pay | Admitting: Advanced Practice Midwife

## 2019-05-27 NOTE — Telephone Encounter (Signed)
Preadmission screen  

## 2019-05-29 ENCOUNTER — Encounter (HOSPITAL_COMMUNITY): Payer: Self-pay | Admitting: Obstetrics and Gynecology

## 2019-05-29 ENCOUNTER — Inpatient Hospital Stay (HOSPITAL_COMMUNITY)
Admission: AD | Admit: 2019-05-29 | Discharge: 2019-05-29 | Disposition: A | Discharge status: Home / Self Care | Payer: 59 | Attending: Obstetrics and Gynecology | Admitting: Obstetrics and Gynecology

## 2019-05-29 ENCOUNTER — Inpatient Hospital Stay (HOSPITAL_BASED_OUTPATIENT_CLINIC_OR_DEPARTMENT_OTHER): Payer: 59

## 2019-05-29 ENCOUNTER — Other Ambulatory Visit: Payer: Self-pay

## 2019-05-29 DIAGNOSIS — O98919 Unspecified maternal infectious and parasitic disease complicating pregnancy, unspecified trimester: Secondary | ICD-10-CM

## 2019-05-29 DIAGNOSIS — O368131 Decreased fetal movements, third trimester, fetus 1: Secondary | ICD-10-CM | POA: Diagnosis not present

## 2019-05-29 DIAGNOSIS — O09513 Supervision of elderly primigravida, third trimester: Secondary | ICD-10-CM | POA: Diagnosis not present

## 2019-05-29 DIAGNOSIS — Z3A38 38 weeks gestation of pregnancy: Secondary | ICD-10-CM

## 2019-05-29 DIAGNOSIS — O09519 Supervision of elderly primigravida, unspecified trimester: Secondary | ICD-10-CM

## 2019-05-29 DIAGNOSIS — O0990 Supervision of high risk pregnancy, unspecified, unspecified trimester: Secondary | ICD-10-CM

## 2019-05-29 DIAGNOSIS — O368130 Decreased fetal movements, third trimester, not applicable or unspecified: Secondary | ICD-10-CM | POA: Diagnosis not present

## 2019-05-29 DIAGNOSIS — O358XX0 Maternal care for other (suspected) fetal abnormality and damage, not applicable or unspecified: Secondary | ICD-10-CM

## 2019-05-29 NOTE — MAU Provider Note (Signed)
Chief Complaint  Patient presents with  . Decreased Fetal Movement    First Provider Initiated Contact with Patient 05/30/19 0027    S: Tina [name]  is a 39 y.o. y.o. year old G1P0 female at [redacted]w[redacted]d weeks gestation who presents to MAU reporting decreased fetal movement all day today. Baby is not very active in general, but has adequate mvmts per fetal kick counts. Today baby did not pass fetal kick count.    Contractions: irreg, mild Vaginal bleeding: Denies Leaking of fluid: Denies  Patient Active Problem List   Diagnosis Date Noted  . Infection in pregnancy, antepartum 01/03/2019  . Supervision of high risk pregnancy, antepartum 11/01/2018  . Advanced maternal age, primigravida, antepartum 11/01/2018  . Twin reversed-arterial perfusion (TRAP) sequence pregnancy, antepartum 11/01/2018  . Paresthesias 04/01/2018  . Nevus of scalp 01/07/2018  . 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 Melasma 01/07/2018  . 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 Hypothyroid in pregnancy, antepartum 03/15/2017  . History of rectal bleeding 03/15/2017   OB History  Gravida Para Term Preterm AB Living  1         0  SAB TAB Ectopic Multiple Live Births               # Outcome Date GA Lbr Len/2nd Weight Sex Delivery Anes PTL Lv  1 Current              Past Medical History:  Diagnosis Date  . Angio-edema   . Hypothyroidism, Levothyroxine only 03/15/2017  . Urticaria     O:  Patient Vitals for the past 24 hrs:  BP Temp Temp src Pulse Resp SpO2 Height Weight  05/29/19 2220 -- -- -- -- -- -- 5\' 9"  (1.753 m) 107 kg  05/29/19 2128 122/81 -- -- 72 19 -- -- --  05/29/19 2030 -- -- -- -- -- 99 % -- --  05/29/19 2028 129/78 98.7 F (37.1 C) Oral 69 16 -- -- --  05/29/19 2025 -- -- -- -- -- 100 % -- --  05/29/19 2020 -- -- -- -- -- 100 % -- --   General: NAD Heart: Regular rate Lungs: Normal rate and effort Abd: Soft, NT, Gravid, S=D Pelvic: Deferred     NST performed EFM: 125, mod  variability, 15x15 accels, no decels Toco: irreg, mild  BPP 8/8 (10/10 w/ reactive NST)  A: [redacted]w[redacted]d week IUP Decreased fetal movement with reassuring fetal testing.  P: Discharge home in stable condition. Labor precautions and fetal kick counts. IOL scheduled 06/03/19 Return to [facility] admissions as needed if symptoms worsen.  Tamala Julian, [place], [place] 05/29/2019 8:58 PM  2

## 2019-05-29 NOTE — MAU Note (Signed)
Decreased fetal movement all day, did kick count but not enough kicks.  No bleeding. No leaking.

## 2019-05-30 ENCOUNTER — Encounter: Payer: Self-pay | Admitting: Obstetrics & Gynecology

## 2019-05-30 ENCOUNTER — Ambulatory Visit (INDEPENDENT_AMBULATORY_CARE_PROVIDER_SITE_OTHER): Payer: 59 | Admitting: Obstetrics & Gynecology

## 2019-05-30 ENCOUNTER — Telehealth (HOSPITAL_COMMUNITY): Payer: Self-pay | Admitting: *Deleted

## 2019-05-30 VITALS — BP 118/78 | HR 79 | Wt 234.0 lb

## 2019-05-30 DIAGNOSIS — O09513 Supervision of elderly primigravida, third trimester: Secondary | ICD-10-CM

## 2019-05-30 DIAGNOSIS — O99283 Endocrine, nutritional and metabolic diseases complicating pregnancy, third trimester: Secondary | ICD-10-CM

## 2019-05-30 DIAGNOSIS — E039 Hypothyroidism, unspecified: Secondary | ICD-10-CM

## 2019-05-30 DIAGNOSIS — Z3A38 38 weeks gestation of pregnancy: Secondary | ICD-10-CM

## 2019-05-30 DIAGNOSIS — O09519 Supervision of elderly primigravida, unspecified trimester: Secondary | ICD-10-CM

## 2019-05-30 DIAGNOSIS — O368131 Decreased fetal movements, third trimester, fetus 1: Secondary | ICD-10-CM

## 2019-05-30 DIAGNOSIS — O0990 Supervision of high risk pregnancy, unspecified, unspecified trimester: Secondary | ICD-10-CM

## 2019-05-30 DIAGNOSIS — O98913 Unspecified maternal infectious and parasitic disease complicating pregnancy, third trimester: Secondary | ICD-10-CM

## 2019-05-30 DIAGNOSIS — O358XX0 Maternal care for other (suspected) fetal abnormality and damage, not applicable or unspecified: Secondary | ICD-10-CM

## 2019-05-30 DIAGNOSIS — O99280 Endocrine, nutritional and metabolic diseases complicating pregnancy, unspecified trimester: Secondary | ICD-10-CM

## 2019-05-30 DIAGNOSIS — O98919 Unspecified maternal infectious and parasitic disease complicating pregnancy, unspecified trimester: Secondary | ICD-10-CM

## 2019-05-30 NOTE — Progress Notes (Signed)
   PRENATAL VISIT NOTE  Subjective:  Tina [name] is a 39 y.o. G1P0 at [redacted]w[redacted]d being seen today for ongoing prenatal care.  She is currently monitored for the following issues for this high-risk pregnancy and has Hypothyroid in pregnancy, antepartum; History of rectal bleeding; Nevus of scalp; Neoplasm of uncertain behavior of skin; Melasma; Nevus spilus; Paresthesias; Supervision of high risk pregnancy, antepartum; Advanced maternal age, primigravida, antepartum; Twin reversed-arterial perfusion (TRAP) sequence pregnancy, antepartum; and Infection in pregnancy, antepartum on their problem list.  Patient reports no complaints. Has decreased fetal movement overnight and was seen in the MAU. BPP was   Contractions: Irregular. Vag. Bleeding: None.  Movement: Present. Denies leaking of fluid.   The following portions of the patient's history were reviewed and updated as appropriate: allergies, current medications, past family history, past medical history, past social history, past surgical history and problem list.   Objective:   Vitals:   05/30/19 0808  BP: 118/78  Pulse: 79  Weight: 234 lb (106.1 kg)    Fetal Status:     Movement: Present     General:  Alert, oriented and cooperative. Patient is in no acute distress.  Skin: Skin is warm and dry. No rash noted.   Cardiovascular: Normal heart rate noted  Respiratory: Normal respiratory effort, no problems with respiration noted  Abdomen: Soft, gravid, appropriate for gestational age.  Pain/Pressure: Present     Pelvic: Cervical exam performed        Extremities: Normal range of motion.  Edema: Trace  Mental Status: Normal mood and affect. Normal behavior. Normal judgment and thought content.   Assessment and Plan:  Pregnancy: G1P0 at [redacted]w[redacted]d 1. Supervision of high risk pregnancy, antepartum For IOL at 39 weeks  2. Acardiac twin during pregnancy, antepartum, single or unspecified fetus  3. Infection in pregnancy, antepartum On  meds  4. Hypothyroid in pregnancy, antepartum stable  5. Advanced maternal age, primigravida, antepartum On baby ASA  Term labor symptoms and general obstetric precautions including but not limited to vaginal bleeding, contractions, leaking of fluid and fetal movement were reviewed in detail with the patient. Please refer to After Visit Summary for other counseling recommendations.   No follow-ups on file.  Future Appointments  Date Time Provider [place]  06/01/2019  9:00 AM [facility]-SCREENING [facility]-SDSC None  06/03/2019 12:00 AM MC-LD SCHED ROOM [facility]-INDC None  07/01/2019  9:15 AM [name], MD [facility] None    [name], MD

## 2019-05-30 NOTE — Telephone Encounter (Signed)
Preadmission screen  

## 2019-06-01 ENCOUNTER — Other Ambulatory Visit (HOSPITAL_COMMUNITY): Payer: 59

## 2019-06-02 ENCOUNTER — Other Ambulatory Visit (HOSPITAL_COMMUNITY): Payer: Self-pay | Admitting: Advanced Practice Midwife

## 2019-06-02 ENCOUNTER — Other Ambulatory Visit: Payer: Self-pay | Admitting: Obstetrics & Gynecology

## 2019-06-02 ENCOUNTER — Encounter (HOSPITAL_COMMUNITY): Payer: Self-pay | Admitting: Advanced Practice Midwife

## 2019-06-02 ENCOUNTER — Other Ambulatory Visit (HOSPITAL_COMMUNITY)
Admission: RE | Admit: 2019-06-02 | Discharge: 2019-06-02 | Disposition: A | Discharge status: Home / Self Care | Payer: 59 | Source: Ambulatory Visit | Attending: Obstetrics & Gynecology | Admitting: Obstetrics & Gynecology

## 2019-06-02 ENCOUNTER — Other Ambulatory Visit: Payer: Self-pay

## 2019-06-02 DIAGNOSIS — Z3A39 39 weeks gestation of pregnancy: Secondary | ICD-10-CM | POA: Diagnosis not present

## 2019-06-02 DIAGNOSIS — E039 Hypothyroidism, unspecified: Secondary | ICD-10-CM | POA: Diagnosis not present

## 2019-06-02 DIAGNOSIS — Z20822 Contact with and (suspected) exposure to covid-19: Secondary | ICD-10-CM | POA: Diagnosis not present

## 2019-06-02 DIAGNOSIS — O99284 Endocrine, nutritional and metabolic diseases complicating childbirth: Secondary | ICD-10-CM | POA: Diagnosis not present

## 2019-06-02 LAB — SARS CORONAVIRUS 2 (TAT 6-24 HRS): SARS Coronavirus 2: NEGATIVE

## 2019-06-02 NOTE — Addendum Note (Signed)
Addended by: [name] on: 06/02/2019 12:27 PM   Modules accepted: Orders, SmartSet

## 2019-06-02 NOTE — MAU Note (Signed)
Covid swab obtained without difficulty. Pt asymptomatic.

## 2019-06-02 NOTE — H&P (Signed)
error 

## 2019-06-03 ENCOUNTER — Inpatient Hospital Stay (HOSPITAL_COMMUNITY): Payer: 59 | Admitting: Anesthesiology

## 2019-06-03 ENCOUNTER — Other Ambulatory Visit: Payer: Self-pay

## 2019-06-03 ENCOUNTER — Encounter (HOSPITAL_COMMUNITY): Payer: Self-pay | Admitting: Obstetrics & Gynecology

## 2019-06-03 ENCOUNTER — Inpatient Hospital Stay (HOSPITAL_COMMUNITY)
Admission: AD | Admit: 2019-06-03 | Discharge: 2019-06-05 | DRG: 807 | Disposition: A | Discharge status: Home / Self Care | Payer: 59 | Attending: Obstetrics & Gynecology | Admitting: Obstetrics & Gynecology

## 2019-06-03 ENCOUNTER — Inpatient Hospital Stay (HOSPITAL_COMMUNITY): Payer: 59

## 2019-06-03 DIAGNOSIS — Z3A39 39 weeks gestation of pregnancy: Secondary | ICD-10-CM

## 2019-06-03 DIAGNOSIS — O99284 Endocrine, nutritional and metabolic diseases complicating childbirth: Secondary | ICD-10-CM | POA: Diagnosis not present

## 2019-06-03 DIAGNOSIS — O09519 Supervision of elderly primigravida, unspecified trimester: Secondary | ICD-10-CM

## 2019-06-03 DIAGNOSIS — O43893 Other placental disorders, third trimester: Secondary | ICD-10-CM | POA: Diagnosis not present

## 2019-06-03 DIAGNOSIS — E039 Hypothyroidism, unspecified: Secondary | ICD-10-CM | POA: Diagnosis not present

## 2019-06-03 DIAGNOSIS — Z20822 Contact with and (suspected) exposure to covid-19: Secondary | ICD-10-CM | POA: Diagnosis present

## 2019-06-03 DIAGNOSIS — O09529 Supervision of elderly multigravida, unspecified trimester: Secondary | ICD-10-CM

## 2019-06-03 DIAGNOSIS — O43813 Placental infarction, third trimester: Secondary | ICD-10-CM | POA: Diagnosis not present

## 2019-06-03 LAB — CBC
HCT: 36.1 % (ref 36.0–46.0)
Hemoglobin: 12.4 g/dL (ref 12.0–15.0)
MCH: 32.4 pg (ref 26.0–34.0)
MCHC: 34.3 g/dL (ref 30.0–36.0)
MCV: 94.3 fL (ref 80.0–100.0)
Platelets: 256 10*3/uL (ref 150–400)
RBC: 3.83 MIL/uL — ABNORMAL LOW (ref 3.87–5.11)
RDW: 13 % (ref 11.5–15.5)
WBC: 11.4 10*3/uL — ABNORMAL HIGH (ref 4.0–10.5)
nRBC: 0 % (ref 0.0–0.2)

## 2019-06-03 LAB — TYPE AND SCREEN
ABO/RH(D): A POS
Antibody Screen: NEGATIVE

## 2019-06-03 LAB — SYPHILIS: RPR W/REFLEX TO RPR TITER AND TREPONEMAL ANTIBODIES, TRADITIONAL SCREENING AND DIAGNOSIS ALGORITHM: RPR Ser Ql: NONREACTIVE

## 2019-06-03 LAB — ABO/RH: ABO/RH(D): A POS

## 2019-06-03 MED ORDER — DIBUCAINE (PERIANAL) 1 % EX OINT: 1.0000 "application " | TOPICAL_OINTMENT | CUTANEOUS | Status: DC | PRN

## 2019-06-03 MED ORDER — TERBUTALINE SULFATE 1 MG/ML IJ SOLN: 0.2500 mg | Freq: Once | INTRAMUSCULAR | Status: DC | PRN

## 2019-06-03 MED ORDER — LACTATED RINGERS IV SOLN: 500.0000 mL | Freq: Once | INTRAVENOUS | Status: DC

## 2019-06-03 MED ORDER — MISOPROSTOL 25 MCG QUARTER TABLET: 25.0000 ug | ORAL_TABLET | ORAL | Status: DC | PRN

## 2019-06-03 MED ORDER — ZOLPIDEM TARTRATE 5 MG PO TABS: 5.0000 mg | ORAL_TABLET | Freq: Every evening | ORAL | Status: DC | PRN

## 2019-06-03 MED ORDER — LACTATED RINGERS IV SOLN: 500.0000 mL | INTRAVENOUS | Status: DC | PRN

## 2019-06-03 MED ORDER — SOD CITRATE-CITRIC ACID 500-334 MG/5ML PO SOLN: 30.0000 mL | ORAL | Status: DC | PRN

## 2019-06-03 MED ORDER — PRENATAL MULTIVITAMIN CH
1.0000 | ORAL_TABLET | Freq: Every day | ORAL | Status: DC
  Administered 2019-06-04 – 2019-06-05 (×2): 1 via ORAL
  Filled 2019-06-03 (×2): qty 1

## 2019-06-03 MED ORDER — SODIUM CHLORIDE (PF) 0.9 % IJ SOLN
INTRAMUSCULAR | Status: DC | PRN
  Administered 2019-06-03: 12 mL/h via EPIDURAL

## 2019-06-03 MED ORDER — OXYCODONE-ACETAMINOPHEN 5-325 MG PO TABS: 2.0000 | ORAL_TABLET | ORAL | Status: DC | PRN

## 2019-06-03 MED ORDER — DIPHENHYDRAMINE HCL 25 MG PO CAPS: 25.0000 mg | ORAL_CAPSULE | Freq: Four times a day (QID) | ORAL | Status: DC | PRN

## 2019-06-03 MED ORDER — ACETAMINOPHEN 325 MG PO TABS: 650.0000 mg | ORAL_TABLET | ORAL | Status: DC | PRN

## 2019-06-03 MED ORDER — COCONUT OIL OIL: 1.0000 "application " | TOPICAL_OIL | Status: DC | PRN

## 2019-06-03 MED ORDER — SENNOSIDES-DOCUSATE SODIUM 8.6-50 MG PO TABS
2.0000 | ORAL_TABLET | ORAL | Status: DC
  Administered 2019-06-03 – 2019-06-05 (×2): 2 via ORAL
  Filled 2019-06-03 (×2): qty 2

## 2019-06-03 MED ORDER — EPHEDRINE 5 MG/ML INJ: 10.0000 mg | INTRAVENOUS | Status: DC | PRN

## 2019-06-03 MED ORDER — LACTATED RINGERS IV SOLN: INTRAVENOUS | Status: DC

## 2019-06-03 MED ORDER — TETANUS-DIPHTH-ACELL PERTUSSIS 5-2.5-18.5 LF-MCG/0.5 IM SUSP: 0.5000 mL | Freq: Once | INTRAMUSCULAR | Status: DC

## 2019-06-03 MED ORDER — PHENYLEPHRINE 40 MCG/ML (10ML) SYRINGE FOR IV PUSH (FOR BLOOD PRESSURE SUPPORT): 80.0000 ug | PREFILLED_SYRINGE | INTRAVENOUS | Status: DC | PRN

## 2019-06-03 MED ORDER — OXYTOCIN 40 UNITS IN NORMAL SALINE INFUSION - SIMPLE MED
2.5000 [IU]/h | INTRAVENOUS | Status: DC
  Filled 2019-06-03: qty 1000

## 2019-06-03 MED ORDER — BENZOCAINE-MENTHOL 20-0.5 % EX AERO
1.0000 "application " | INHALATION_SPRAY | CUTANEOUS | Status: DC | PRN
  Filled 2019-06-03: qty 56

## 2019-06-03 MED ORDER — LEVOTHYROXINE SODIUM 75 MCG PO TABS
150.0000 ug | ORAL_TABLET | Freq: Every day | ORAL | Status: DC
  Administered 2019-06-04 – 2019-06-05 (×2): 150 ug via ORAL
  Filled 2019-06-03 (×3): qty 2

## 2019-06-03 MED ORDER — LEVOTHYROXINE SODIUM 75 MCG PO TABS
150.0000 ug | ORAL_TABLET | Freq: Every day | ORAL | Status: DC
  Administered 2019-06-03: 150 ug via ORAL
  Filled 2019-06-03 (×2): qty 1

## 2019-06-03 MED ORDER — OXYTOCIN BOLUS FROM INFUSION
500.0000 mL | Freq: Once | INTRAVENOUS | Status: AC
  Administered 2019-06-03: 500 mL via INTRAVENOUS

## 2019-06-03 MED ORDER — IBUPROFEN 600 MG PO TABS
600.0000 mg | ORAL_TABLET | Freq: Four times a day (QID) | ORAL | Status: DC
  Administered 2019-06-03 – 2019-06-05 (×7): 600 mg via ORAL
  Filled 2019-06-03 (×7): qty 1

## 2019-06-03 MED ORDER — SIMETHICONE 80 MG PO CHEW: 80.0000 mg | CHEWABLE_TABLET | ORAL | Status: DC | PRN

## 2019-06-03 MED ORDER — FENTANYL-BUPIVACAINE-NACL 0.5-0.125-0.9 MG/250ML-% EP SOLN
12.0000 mL/h | EPIDURAL | Status: DC | PRN
  Filled 2019-06-03: qty 250

## 2019-06-03 MED ORDER — OXYTOCIN 40 UNITS IN NORMAL SALINE INFUSION - SIMPLE MED
1.0000 m[IU]/min | INTRAVENOUS | Status: DC
  Administered 2019-06-03: 2 m[IU]/min via INTRAVENOUS

## 2019-06-03 MED ORDER — ONDANSETRON HCL 4 MG/2ML IJ SOLN
4.0000 mg | Freq: Four times a day (QID) | INTRAMUSCULAR | Status: DC | PRN
  Administered 2019-06-03 (×2): 4 mg via INTRAVENOUS
  Filled 2019-06-03 (×2): qty 2

## 2019-06-03 MED ORDER — WITCH HAZEL-GLYCERIN EX PADS: 1.0000 "application " | MEDICATED_PAD | CUTANEOUS | Status: DC | PRN

## 2019-06-03 MED ORDER — LIDOCAINE HCL (PF) 1 % IJ SOLN: 30.0000 mL | INTRAMUSCULAR | Status: DC | PRN

## 2019-06-03 MED ORDER — LIDOCAINE-EPINEPHRINE (PF) 2 %-1:200000 IJ SOLN
INTRAMUSCULAR | Status: DC | PRN
  Administered 2019-06-03 (×2): 2 mL via EPIDURAL

## 2019-06-03 MED ORDER — OXYCODONE-ACETAMINOPHEN 5-325 MG PO TABS: 1.0000 | ORAL_TABLET | ORAL | Status: DC | PRN

## 2019-06-03 MED ORDER — PRENATAL MULTIVITAMIN CH: 1.0000 | ORAL_TABLET | Freq: Every day | ORAL | Status: DC

## 2019-06-03 MED ORDER — ONDANSETRON HCL 4 MG/2ML IJ SOLN: 4.0000 mg | INTRAMUSCULAR | Status: DC | PRN

## 2019-06-03 MED ORDER — DIPHENHYDRAMINE HCL 50 MG/ML IJ SOLN: 12.5000 mg | INTRAMUSCULAR | Status: DC | PRN

## 2019-06-03 MED ORDER — BUTORPHANOL TARTRATE 1 MG/ML IJ SOLN
1.0000 mg | INTRAMUSCULAR | Status: DC | PRN
  Administered 2019-06-03: 1 mg via INTRAVENOUS
  Filled 2019-06-03: qty 1

## 2019-06-03 MED ORDER — ONDANSETRON HCL 4 MG PO TABS: 4.0000 mg | ORAL_TABLET | ORAL | Status: DC | PRN

## 2019-06-03 NOTE — Anesthesia Preprocedure Evaluation (Signed)
Anesthesia Evaluation  Patient identified by MRN, date of birth, ID band Patient awake    Reviewed: Allergy & Precautions, NPO status , Patient's Chart, lab work & pertinent test results  Airway Mallampati: II  TM Distance: >3 FB Neck ROM: Full    Dental no notable dental hx.    Pulmonary neg pulmonary ROS,    Pulmonary exam normal breath sounds clear to auscultation       Cardiovascular negative cardio ROS Normal cardiovascular exam Rhythm:Regular Rate:Normal     Neuro/Psych negative neurological ROS  negative psych ROS   GI/Hepatic negative GI ROS, Neg liver ROS,   Endo/Other  Hypothyroidism   Renal/GU negative Renal ROS  negative genitourinary   Musculoskeletal negative musculoskeletal ROS (+)   Abdominal   Peds  Hematology negative hematology ROS (+)   Anesthesia Other Findings   Reproductive/Obstetrics (+) Pregnancy                             Anesthesia Physical Anesthesia Plan  ASA: II  Anesthesia Plan: Epidural   Post-op Pain Management:    Induction:   PONV Risk Score and Plan: Treatment may vary due to age or medical condition  Airway Management Planned: Natural Airway  Additional Equipment:   Intra-op Plan:   Post-operative Plan:   Informed Consent: I have reviewed the patients History and Physical, chart, labs and discussed the procedure including the risks, benefits and alternatives for the proposed anesthesia with the patient or authorized representative who has indicated his/her understanding and acceptance.       Plan Discussed with: Anesthesiologist  Anesthesia Plan Comments: (Patient identified. Risks, benefits, options discussed with patient including but not limited to bleeding, infection, nerve damage, paralysis, failed block, incomplete pain control, headache, blood pressure changes, nausea, vomiting, reactions to medication, itching, and post  partum back pain. Confirmed with bedside nurse the patient's most recent platelet count. Confirmed with the patient that they are not taking any anticoagulation, have any bleeding history or any family history of bleeding disorders. Patient expressed understanding and wishes to proceed. All questions were answered. )        Anesthesia Quick Evaluation

## 2019-06-03 NOTE — Progress Notes (Signed)
Alea Rodes is a 39 y.o. G1P0 at [redacted]w[redacted]d by LMP admitted for induction of labor due to [facility], twin trap syndrome.  Subjective: Pt s/p epidural and she is comfortable now.    Objective: BP (!) 109/54   Pulse 66   Temp 98.3 F (36.8 C) (Oral)   Resp 14   Ht 5\' 9"  (1.753 m)   Wt 108.1 kg   LMP 09/03/2018   BMI 35.21 kg/m  No intake/output data recorded. No intake/output data recorded.  FHT:  FHR: 120's bpm, variability: moderate,  accelerations:  Present,  decelerations:  Absent UC:   regular, every 4 minutes. Not picked up since epidural. IUPC placed  SVE:   Dilation: 4 Effacement (%): 60, 70 Station: Plus 1 Exam by:: Dr. Ihor Dow  Labs: Lab Results  Component Value Date   WBC 11.4 (H) 06/03/2019   HGB 12.4 06/03/2019   HCT 36.1 06/03/2019   MCV 94.3 06/03/2019   PLT 256 06/03/2019    Assessment / Plan: Induction of labor due to [place],  progressing well on pitocin  Labor: Progressing normally Fetal Wellbeing:  Category I Pain Control:  Epidural I/D:  n/a Anticipated MOD:  NSVD  [name] 06/03/2019, 2:04 PM

## 2019-06-03 NOTE — Progress Notes (Signed)
Tina [name] is a 39 y.o. G1P0 at [redacted]w[redacted]d by LMP admitted for induction of labor due to [facility].  Subjective: Pt feeling comfortable. Has one spot that is not as well controlled with eipdural.  Objective: BP 119/67   Pulse 74   Temp 98 F (36.7 C) (Oral)   Resp 14   Ht 5\' 9"  (1.753 m)   Wt 108.1 kg   LMP 09/03/2018   BMI 35.21 kg/m  No intake/output data recorded. No intake/output data recorded.  FHT:  FHR: 120's bpm, variability: moderate,  accelerations:  Present,  decelerations:  Absent UC:   regular, every 2 minutes SVE:   Dilation: 10 Effacement (%): 60, 70 Station: Plus 1, Plus 2 Exam by:: Dr. Ihor Dow  Labs: Lab Results  Component Value Date   WBC 11.4 (H) 06/03/2019   HGB 12.4 06/03/2019   HCT 36.1 06/03/2019   MCV 94.3 06/03/2019   PLT 256 06/03/2019    Assessment / Plan: Induction of labor due to [place],  progressing well on pitocin  Labor: Progressing normally Fetal Wellbeing:  Category I Pain Control:  Epidural I/D:  n/a Anticipated MOD:  NSVD  [name] 06/03/2019, 4:45 PM

## 2019-06-03 NOTE — Progress Notes (Signed)
Tina [name] is a 39 y.o. G1P0 at [redacted]w[redacted]d   Subjective: S/p epidural, resting comfortably, verbalizing desire to nap per RN  Objective: BP 118/79   Pulse 73   Temp 98 F (36.7 C) (Oral)   Resp 12   Ht 5\' 9"  (1.753 m)   Wt 108.1 kg   LMP 09/03/2018   BMI 35.21 kg/m  No intake/output data recorded. No intake/output data recorded.  FHT:  FHR: 120 bpm, variability: moderate,  accelerations:  Present,  decelerations:  Absent UC:   regular, every 2-3 minutes SVE:   Dilation: 4 Effacement (%): 60, 70 Station: Plus 1 Exam by:: Dr. Ihor Dow  Labs: Lab Results  Component Value Date   WBC 11.4 (H) 06/03/2019   HGB 12.4 06/03/2019   HCT 36.1 06/03/2019   MCV 94.3 06/03/2019   PLT 256 06/03/2019    Assessment / Plan: --Tracing reviewed with RN to allow patient to continue sleeping --Cat I, s/p SROM today at 0045, Pitocin infusing at 10 milliunits, IUPC in place, adequate MVUs --Anticipate NSVD  Darlina Rumpf, CNM 06/03/2019, 3:21 PM

## 2019-06-03 NOTE — Discharge Summary (Signed)
Postpartum Discharge Summary     Patient Name: Tina [name] DOB: [birth year] MRN: [id number]  Date of admission: 06/03/2019 Delivering Provider: Dr. [name]  Date of discharge: 06/05/2019  Admitting diagnosis: Advanced maternal age, primigravida [O09.519] Intrauterine pregnancy: [redacted]w[redacted]d     Secondary diagnosis:  Active Problems:   Advanced maternal age, primigravida     Discharge diagnosis: Term Pregnancy Delivered                                                                                                Post partum procedures: None  Augmentation: AROM, Pitocin and Foley Balloon  Complications: None  Hospital course:  Induction of Labor With Vaginal Delivery   39 y.o. yo G1P0 at [redacted]w[redacted]d was admitted to the hospital 06/03/2019 for induction of labor.  Indication for induction: AMA.  Patient had an uncomplicated labor course as follows: Membrane Rupture Time/Date: 12:55 AM ,06/03/2019   Intrapartum Procedures: Episiotomy: None [1] none                                        Lacerations:  2nd degree [3];Perineal [11] 2nd degree repaired with 3-0 Vicryl  Patient had delivery of a Viable female infant on 06/03/2019. Delivery time: 6:48 PM  Information for the patient's newborn:  [place], [place] [id number]  Delivery Method: Vag-Spont  Details of delivery can be found in separate delivery note.  Patient had a routine postpartum course. Patient is discharged home 06/05/19.   Physical exam  Vitals:   06/04/19 1100 06/04/19 1500 06/04/19 2105 06/05/19 0623  BP: 115/72 121/70 109/67 105/70  Pulse: 72 68 68 62  Resp: 18 18 18 18   Temp: 98.4 F (36.9 C) 98.2 F (36.8 C) 99.1 F (37.3 C) 98.1 F (36.7 C)  TempSrc: Oral Oral Oral Oral  SpO2: 99% 98%  98%  Weight:      Height:       General: alert, cooperative and no distress Uterine Fundus: firm Lochia: appropriate Laceration: Healing well with no significant drainage, sutures in place DVT Evaluation: No  evidence of DVT seen on physical exam. Negative Homan's sign. Labs: Lab Results  Component Value Date   WBC 14.5 (H) 06/04/2019   HGB 11.8 (L) 06/04/2019   HCT 35.0 (L) 06/04/2019   MCV 95.9 06/04/2019   PLT 226 06/04/2019   CMP Latest Ref Rng & Units 03/29/2018  Glucose 70 - 99 mg/dL 89  BUN 6 - 23 mg/dL 11  Creatinine 0.40 - 1.20 mg/dL 0.93  Sodium 135 - 145 mEq/L 138  Potassium 3.5 - 5.1 mEq/L 3.7  Chloride 96 - 112 mEq/L 105  CO2 19 - 32 mEq/L 27  Calcium 8.4 - 10.5 mg/dL 9.4  Total Protein 6.0 - 8.3 g/dL 7.3  Total Bilirubin 0.2 - 1.2 mg/dL 0.7  Alkaline Phos 39 - 117 U/L 38(L)  AST 0 - 37 U/L 14  ALT 0 - 35 U/L 11   Edinburgh Score: Edinburgh Postnatal Depression Scale Screening Tool 06/04/2019  I have been able to laugh and see the funny side of things. 0  I have looked forward with enjoyment to things. 0  I have blamed myself unnecessarily when things went wrong. 1  I have been anxious or worried for no good reason. 0  I have felt scared or panicky for no good reason. 0  Things have been getting on top of me. 1  I have been so unhappy that I have had difficulty sleeping. 0  I have felt sad or miserable. 0  I have been so unhappy that I have been crying. 1  The thought of harming myself has occurred to me. 0  Edinburgh Postnatal Depression Scale Total 3    Discharge instruction: per After Visit Summary and "Baby and Me Booklet".  After visit meds:  Allergies as of 06/05/2019      Reactions   Augmentin [amoxicillin-pot Clavulanate] Rash   Penicillins Other (See Comments)   Per allergist Causes inflammation of the blood vessels. Did it involve swelling of the face/tongue/throat, SOB, or low BP? No Did it involve sudden or severe rash/hives, skin peeling, or any reaction on the inside of your mouth or nose? No Did you need to seek medical attention at a hospital or doctor's office? Yes When did it last happen? Summer of 2017 If all above answers are "NO",  may proceed with cephalosporin use.      Medication List    STOP taking these medications   aspirin EC 81 MG tablet   valACYclovir 1000 MG tablet Commonly known as: VALTREX     TAKE these medications   acetaminophen 500 MG tablet Commonly known as: TYLENOL Take 1,000 mg by mouth every 6 (six) hours as needed for mild pain or headache.   ibuprofen 600 MG tablet Commonly known as: ADVIL Take 1 tablet (600 mg total) by mouth every 6 (six) hours as needed for mild pain or cramping.   levothyroxine 150 MCG tablet Commonly known as: Synthroid Take 1 tablet (150 mcg total) by mouth daily before breakfast.   METAMUCIL PO Take 1 Scoop by mouth daily. 1 tablespoon.   polyethylene glycol 17 g packet Commonly known as: MIRALAX / GLYCOLAX Take 17 g by mouth daily.   prenatal multivitamin Tabs tablet Take 1 tablet by mouth at bedtime.            Discharge Care Instructions  (From admission, onward)         Start     Ordered   06/05/19 0000  Discharge wound care:    Comments: As per discharge handout and nursing instructions   06/05/19 1151          Diet: routine diet  Activity: Advance as tolerated. Pelvic rest for 6 weeks.   Future Appointments  Date Time Provider [place]  07/01/2019  9:15 AM [name], MD [facility] None   Follow up Visit: [place] Follow up in 4 week(s).   Why: With Dr. Evans Lance information: [address], [address] [place] [phone]         High risk pregnancy complicated by: [facility] Delivery mode:  SVD Anticipated Birth Control:  IUD PP Procedures needed: none  Schedule Integrated BH visit: no  Newborn Data: Live born female  Birth Weight: 2801 g  APGAR: 9/9   Newborn Delivery   Birth date/time: 06/03/2019 18:48:00 Delivery type:       Baby Feeding: Breast  Disposition:home with mother   06/05/2019 Verita Schneiders, MD

## 2019-06-03 NOTE — H&P (Addendum)
Tina [name] is a 39 y.o. female G1P0 with IUP at 27 weeks presenting for IOL for AMA, hypothyroid, TRAP pregnancy (resolved). PNCare at [facility] since 8 wks  Prenatal History/Complications:  TRAP pregnancy, resolved Elevated MSAFP ([place] normal, ? D/t test being run as a twin pregnancy) HSV Infection on suppression   Past Medical History: Past Medical History:  Diagnosis Date  . Angio-edema   . Hypothyroidism, Levothyroxine only 03/15/2017  . Mirena IUD (intrauterine device) in place 03/15/2017  . Urticaria     Past Surgical History: Past Surgical History:  Procedure Laterality Date  . NO PAST SURGERIES      Obstetrical History: OB History    Gravida  1   Para      Term      Preterm      AB      Living  0     SAB      TAB      Ectopic      Multiple      Live Births              Social History: Social History   Socioeconomic History  . Marital status: Single    Spouse name: Not on file  . Number of children: Not on file  . Years of education: Not on file  . Highest education level: Not on file  Occupational History  . Occupation: OB/GYN    Employer: [employer]  Tobacco Use  . Smoking status: Never Smoker  . Smokeless tobacco: Never Used  Substance and Sexual Activity  . Alcohol use: Not Currently    Comment: Social  . Drug use: No  . Sexual activity: Yes    Partners: Male  Other Topics Concern  . Not on file  Social History Narrative  . Not on file   Social Determinants of Health   Financial Resource Strain:   . Difficulty of Paying Living Expenses: Not on file  Food Insecurity:   . Worried About Charity fundraiser in the Last Year: Not on file  . Ran Out of Food in the Last Year: Not on file  Transportation Needs:   . Lack of Transportation (Medical): Not on file  . Lack of Transportation (Non-Medical): Not on file  Physical Activity:   . Days of Exercise per Week: Not on file  . Minutes of Exercise per Session: Not on  file  Stress:   . Feeling of Stress : Not on file  Social Connections:   . Frequency of Communication with Friends and Family: Not on file  . Frequency of Social Gatherings with Friends and Family: Not on file  . Attends Religious Services: Not on file  . Active Member of Clubs or Organizations: Not on file  . Attends Archivist Meetings: Not on file  . Marital Status: Not on file    Family History: Family History  Problem Relation Age of Onset  . Arthritis Mother   . Leukemia Mother   . Other Sister        accessory pathways in heart    Allergies: Allergies  Allergen Reactions  . Augmentin [Amoxicillin-Pot Clavulanate] Rash  . Penicillins Rash    Medications Prior to Admission  Medication Sig Dispense Refill Last Dose  . aspirin EC 81 MG tablet Take 81 mg by mouth daily.   06/02/2019 at Unknown time  . levothyroxine (SYNTHROID) 150 MCG tablet Take 1 tablet (150 mcg total) by mouth daily before breakfast. 30  tablet 3 06/02/2019 at Unknown time  . Prenatal Vit-Fe Fumarate-FA (PRENATAL MULTIVITAMIN) TABS tablet Take 1 tablet by mouth daily at 12 noon.   06/02/2019 at Unknown time  . polyethylene glycol (MIRALAX / GLYCOLAX) 17 g packet Take 17 g by mouth daily.   Unknown at Unknown time  . Psyllium (METAMUCIL PO) Take by mouth.   Unknown at Unknown time         Review of Systems   Constitutional: Negative for fever and chills Eyes: Negative for visual disturbances Respiratory: Negative for shortness of breath, dyspnea Cardiovascular: Negative for chest pain or palpitations  Gastrointestinal: Negative for abdominal pain, vomiting, diarrhea and constipation.   Genitourinary: Negative for dysuria and urgency Musculoskeletal: Negative for back pain, joint pain, myalgias  Neurological: Negative for dizziness and headaches      Blood pressure 121/79, pulse 76, temperature 98.3 F (36.8 C), temperature source Oral, resp. rate 16, height 5\' 9"  (1.753 m), weight  108.1 kg, last menstrual period 09/03/2018, unknown if currently breastfeeding. General appearance: alert, cooperative, appears stated age and no distress Lungs: normal respiratory effort Heart: regular rate and rhythm Abdomen: soft, non-tender; bowel sounds normal Extremities: Homans sign is negative, no sign of DVT DTR's 2+ PELVIC:  SSE:  No lesions Presentation: cephalic Fetal monitoring  Baseline: 140 bpm, Variability: Good {> 6 bpm), Accelerations: Reactive, and Decelerations: Absent Uterine activity  None  Dilation: Fingertip Station: -2 Exam by:: Manus Gunning CNM  Cx FT/30/-2/vtx   Prenatal labs: ABO, Rh: --/--/A POS, A POS Performed at [facility], [place] [address], [place], [place] [address]  [phone]) Antibody: NEG (02/12 0050) Rubella: immune RPR: Non Reactive (11/09 0901)  HBsAg: Negative (07/13 1207)  HIV: Non Reactive (11/09 0901)  GBS: Negative/-- (01/25 1206)   Nursing Staff Provider  Office Location [facility]  Dating  1st trimester [place] equals to dates  Language  English  Anatomy US  acradiac twin   Flu Vaccine  01-03-2019 Genetic Screen  NIPS: low risk  AFP:   Elevated AFP, but most likely d/t early twin loss.  [place] normal   TDaP vaccine   119/2020 Hgb A1C or  GTT  Third trimester WNL  Rhogam  n/a   LAB RESULTS   Feeding Plan breast Blood Type A/Positive/-- (07/13 1207) A pos  Contraception OCPs vs LnIUD Antibody Negative (07/13 1207)  Circumcision n/a Rubella 9.54 (07/13 1207)immune  Pediatrician   RPR Non Reactive (11/09 0901) neg  Support Person Nikhiel-husband HBsAg Negative (07/13 1207) neg  Prenatal Classes  HIV Non Reactive (11/09 0901)neg  BTL Consent  GBS  (For PCN allergy, check sensitivities)   VBAC Consent  Pap  WNL  11/01/2018    Hgb Electro    BP Cuff  CF     SMA     Waterbirth  [ ]  Class [ ]  Consent [ ]  CNM visit    Prenatal Transfer Tool  Maternal Diabetes: No Genetic Screening: normal NIPS, MSAFP ^ for OSB Maternal  Ultrasounds/Referrals: Normal Fetal Ultrasounds or other Referrals:  Other: TRAP pregnancy, resolved Maternal Substance Abuse:  No Significant Maternal Medications:  None Significant Maternal Lab Results: Group B Strep negative, HSV on suppression    Results for orders placed or performed during the hospital encounter of 06/03/19 (from the past 24 hour(s))  CBC   Collection Time: 06/03/19 12:40 AM  Result Value Ref Range   WBC 11.4 (H) 4.0 - 10.5 K/uL   RBC 3.83 (L) 3.87 - 5.11 MIL/uL  Hemoglobin 12.4 12.0 - 15.0 g/dL   HCT 36.1 36.0 - 46.0 %   MCV 94.3 80.0 - 100.0 fL   MCH 32.4 26.0 - 34.0 pg   MCHC 34.3 30.0 - 36.0 g/dL   RDW 13.0 11.5 - 15.5 %   Platelets 256 150 - 400 K/uL   nRBC 0.0 0.0 - 0.2 %  Type and screen   Collection Time: 06/03/19 12:50 AM  Result Value Ref Range   ABO/RH(D) A POS    Antibody Screen NEG    Sample Expiration      06/06/2019,2359 Performed at [facility], [place] [address], [place], [place] [address]   ABO/Rh   Collection Time: 06/03/19 12:50 AM  Result Value Ref Range   ABO/RH(D)      A POS Performed at [facility] [address], [place], [place] [address]   Results for orders placed or performed during the hospital encounter of 06/02/19 (from the past 24 hour(s))  SARS CORONAVIRUS 2 (TAT 6-24 HRS) Nasopharyngeal Nasopharyngeal Swab   Collection Time: 06/02/19  3:10 PM   Specimen: Nasopharyngeal Swab  Result Value Ref Range   SARS Coronavirus 2 NEGATIVE NEGATIVE    Assessment: Tina [name] is a 39 y.o. G1P0 with an IUP at 31 weeks presenting for IOL for AMA, hypothyroidism, TRAP pregnancy, resolved.  Plan: #Labor: Foley inserted w/SSE/ring forceps and inflated w/60cc H20.  During the inflation, SROM w/clear fluid.  Discussed POC w/CHS, agreed w/plan for Pitocin up to 6 mu/min.  Pt declines cytotec #Pain:  Per request #FWB Cat 1    Christin Fudge 06/03/2019, 4:38 AM

## 2019-06-03 NOTE — Anesthesia Procedure Notes (Signed)
Epidural Patient location during procedure: OB Start time: 06/03/2019 12:10 PM End time: 06/03/2019 12:25 PM  Staffing Anesthesiologist: Freddrick March, MD Performed: anesthesiologist   Preanesthetic Checklist Completed: patient identified, IV checked, risks and benefits discussed, monitors and equipment checked, pre-op evaluation and timeout performed  Epidural Patient position: sitting Prep: DuraPrep and site prepped and draped Patient monitoring: continuous pulse ox, blood pressure, heart rate and cardiac monitor Approach: midline Location: L3-L4 Injection technique: LOR air  Needle:  Needle type: Tuohy  Needle gauge: 17 G Needle length: 9 cm Needle insertion depth: 5 cm Catheter type: closed end flexible Catheter size: 19 Gauge Catheter at skin depth: 11 cm Test dose: negative  Assessment Sensory level: T8 Events: blood not aspirated, injection not painful, no injection resistance, no paresthesia and negative IV test  Additional Notes Patient identified. Risks/Benefits/Options discussed with patient including but not limited to bleeding, infection, nerve damage, paralysis, failed block, incomplete pain control, headache, blood pressure changes, nausea, vomiting, reactions to medication both or allergic, itching and postpartum back pain. Confirmed with bedside nurse the patient's most recent platelet count. Confirmed with patient that they are not currently taking any anticoagulation, have any bleeding history or any family history of bleeding disorders. Patient expressed understanding and wished to proceed. All questions were answered. Sterile technique was used throughout the entire procedure. Please see nursing notes for vital signs. Test dose was given through epidural catheter and negative prior to continuing to dose epidural or start infusion. Warning signs of high block given to the patient including shortness of breath, tingling/numbness in hands, complete motor block,  or any concerning symptoms with instructions to call for help. Patient was given instructions on fall risk and not to get out of bed. All questions and concerns addressed with instructions to call with any issues or inadequate analgesia.  Reason for block:procedure for pain

## 2019-06-03 NOTE — Progress Notes (Signed)
Tina [name] is a 39 y.o. G1P0 at [redacted]w[redacted]d   Subjective: Laboring on birthing ball at bedside. Coping very well with focused breathing, supportive partner at bedside.  Objective: BP 124/76   Pulse 74   Temp 98.3 F (36.8 C) (Oral)   Resp 18   Ht 5\' 9"  (1.753 m)   Wt 108.1 kg   LMP 09/03/2018   BMI 35.21 kg/m  No intake/output data recorded. No intake/output data recorded.  FHT:  FHR: 125 bpm, variability: moderate,  accelerations:  Present,  decelerations:  Absent UC:   irregular, every 2-3 minutes SVE:   Dilation: 4.5 Effacement (%): 70 Station: Plus 1 Exam by:: Rosine Abe, CNM  Labs: Lab Results  Component Value Date   WBC 11.4 (H) 06/03/2019   HGB 12.4 06/03/2019   HCT 36.1 06/03/2019   MCV 94.3 06/03/2019   PLT 256 06/03/2019    Assessment / Plan: --Cervical exam per patient request --Cat I tracing --Patient requesting epidural. Order placed --Dr. Ihor Dow on unit, update provided --Anticipate NSVD  Darlina Rumpf, [place] 06/03/2019, 12:51 PM

## 2019-06-03 NOTE — Progress Notes (Signed)
Tina [name] is a 39 y.o. G1P0 at [redacted]w[redacted]d   Subjective: Rocking on birthing ball at bedside. Coping very well with contractions. Planning epidural but declines additional pain management interventions at this time.  Objective: BP 122/68   Pulse 82   Temp 98.2 F (36.8 C) (Oral)   Resp 18   Ht 5\' 9"  (1.753 m)   Wt 108.1 kg   LMP 09/03/2018   BMI 35.21 kg/m  No intake/output data recorded. No intake/output data recorded.  FHT:  FHR: 125 bpm, variability: moderate,  accelerations:  Present,  decelerations:  Absent UC:   regular, every 2-3 minutes SVE:   Dilation: 3 Effacement (%): 60 Station: 0, Plus 1 Exam by:: Dr. Ihor Dow  Labs: Lab Results  Component Value Date   WBC 11.4 (H) 06/03/2019   HGB 12.4 06/03/2019   HCT 36.1 06/03/2019   MCV 94.3 06/03/2019   PLT 256 06/03/2019    Assessment / Plan: --Cat I tracing --Reviewed options for position changes q 45-60 minutes --Continue Pitocin titration PRN, currently infusing at 10 milliunits --Epidural on maternal request --Anticipate NSVD  Darlina Rumpf, [place] 06/03/2019, 9:21 AM

## 2019-06-04 LAB — CBC
HCT: 35 % — ABNORMAL LOW (ref 36.0–46.0)
Hemoglobin: 11.8 g/dL — ABNORMAL LOW (ref 12.0–15.0)
MCH: 32.3 pg (ref 26.0–34.0)
MCHC: 33.7 g/dL (ref 30.0–36.0)
MCV: 95.9 fL (ref 80.0–100.0)
Platelets: 226 10*3/uL (ref 150–400)
RBC: 3.65 MIL/uL — ABNORMAL LOW (ref 3.87–5.11)
RDW: 13.3 % (ref 11.5–15.5)
WBC: 14.5 10*3/uL — ABNORMAL HIGH (ref 4.0–10.5)
nRBC: 0 % (ref 0.0–0.2)

## 2019-06-04 MED ORDER — POLYETHYLENE GLYCOL 3350 17 G PO PACK
17.0000 g | PACK | Freq: Every day | ORAL | Status: DC | PRN
  Administered 2019-06-04: 17 g via ORAL
  Filled 2019-06-04: qty 1

## 2019-06-04 NOTE — Lactation Note (Signed)
This note was copied from a baby's chart. Lactation Consultation Note  Patient Name: Tina [name] [id number] Date: 06/04/2019 Reason for consult: Follow-up assessment;[place] Mom reports she just fed her but she is still cuing.  reviewed hunger cues, beginnings of breastfeeding. Discussed hand expression and spoon feeding past breastfeeding.  Urged parents to watch [place] hand expression video.  Observed mom attempting to breastfeed infant.  Infant will open and attempt to latch but unable to maintain. Starts sucking on tip of nipple and dimpling her cheeks.  After a few attempts and different positions, tried with a 20 mm nipple shield.  Infant latched and breastfed well .  Colostrum noted in shield.  Discussed pumping with DEBP past breastfeedings.  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 Gave mom info regarding her DEBP with [facility] insurance Mom reports she would also like a nap.  Infant with wet diaper .  Still cuing but will not latch back on at this time.  Left dad changing diaper.  Mom plans for a nap.  Will call out at next feeding to initiate pumping with DEBP with mom past the breastfeeding.  Maternal Data Has patient been taught Hand Expression?: Yes Does the patient have breastfeeding experience prior to this delivery?: No  Feeding Feeding Type: Breast Fed  LATCH Score Latch: Grasps breast easily, tongue down, lips flanged, rhythmical sucking.  Audible Swallowing: A few with stimulation  Type of Nipple: Flat  Comfort (Breast/Nipple): Soft / non-tender  Hold (Positioning): No assistance needed to correctly position infant at breast.  LATCH Score: 8  Interventions Interventions: Assisted with latch;Pre-pump if needed;Breast compression;Expressed milk  Lactation Tools Discussed/Used Tools: Nipple Shields Nipple shield size: 20   Consult Status Consult Status: Follow-up Date: 06/05/19 Follow-up type: In-patient    [facility] Caul 06/04/2019, 2:49 PM

## 2019-06-04 NOTE — Anesthesia Postprocedure Evaluation (Signed)
Anesthesia Post Note  Patient: Tina [name]  Procedure(s) Performed: AN AD [place]     Patient location during evaluation: Mother Baby Anesthesia Type: Epidural Level of consciousness: awake and alert Pain management: pain level controlled Vital Signs Assessment: post-procedure vital signs reviewed and stable Respiratory status: spontaneous breathing Cardiovascular status: stable Postop Assessment: no headache, no backache, patient able to bend at knees, epidural receding and able to ambulate Anesthetic complications: no Comments: Pt in shower, spoke with husband who asked pt questions for me. Verbal responses noted via phone. No concerns and husband reports pt is feeling well.    Last Vitals:  Vitals:   06/04/19 0337 06/04/19 0651  BP: 105/65 112/69  Pulse: 71 77  Resp: 16 18  Temp: 36.8 C 36.8 C  SpO2: 98% 100%    Last Pain:  Vitals:   06/04/19 0745  TempSrc:   PainSc: 0-No pain   Pain Goal:                Epidural/Spinal Function Cutaneous sensation: Normal sensation (06/04/19 0745), Patient able to flex knees: Yes (06/04/19 0745), Patient able to lift hips off bed: Yes (06/04/19 0745), Back pain beyond tenderness at insertion site: No (06/04/19 0745), Progressively worsening motor and/or sensory loss: No (06/04/19 0745), Bowel and/or bladder incontinence post epidural: No (06/04/19 0745)  Everette Rank

## 2019-06-04 NOTE — Lactation Note (Addendum)
This note was copied from a baby's chart. Lactation Consultation Note  Patient Name: Tina [name] Today's Date: 06/04/2019 Reason for consult: Initial assessment;1st time breastfeeding P1, 11 hour female infant-3% weight loss. Infant had 2 stools since birth. Mom's hx: AMA, hypothyroidism on synthroid L2-safe with breastfeeding.   Mom was given breast shells to wear in bra during the day and breast pump to pre-pump prior to latching infant at breast due to having flat nipples. Per mom, she found that using the hand pump prior to latching infant has been very helpful.  Mom attended virtual breastfeeding class online that was offered by [facility]  Per mom, infant is improving with latching and breastfeed 4 times since birth, infant breast feed for for 40 minutes 1 hour prior to [facility] entering the room. Mom has been doing STS and breast compression while breastfeeding infant. LC discussed hand expression and mom taught back and infant was given 1 mls of colostrum by spoon. Mom doesn't have any breastfeeding questions or concerns at this time. Mom knows to breastfeed infant according to hunger cues, on demand, 8 to 12 times within 24 hours and not to exceed 3 hours without breastfeeding infant. Mom know if she needs assistance with latching infant at breast to call RN or LC.  Reviewed Baby & Me book's Breastfeeding Basics.  Mom made aware of O/P services, breastfeeding support groups, community resources, and our phone # for post-discharge questions.   Maternal Data Formula Feeding for Exclusion: No Has patient been taught Hand Expression?: Yes Does the patient have breastfeeding experience prior to this delivery?: No  Feeding Feeding Type: Breast Fed  LATCH Score                   Interventions Interventions: Breast feeding basics reviewed;Skin to skin;Breast compression;Hand express  Lactation Tools Discussed/Used WIC Program: No   Consult Status Consult  Status: Follow-up Date: 06/04/19 Follow-up type: In-patient    Tina [name] 06/04/2019, 6:18 AM

## 2019-06-04 NOTE — Progress Notes (Signed)
Post Partum Day 1 Subjective: no complaints, up ad lib, voiding, tolerating PO, + flatus and no BM. Has an issue with constipation so requests Miralax.    Objective: Blood pressure 112/69, pulse 77, temperature 98.3 F (36.8 C), temperature source Oral, resp. rate 18, height 5\' 9"  (1.753 m), weight 108.1 kg, last menstrual period 09/03/2018, SpO2 100 %, unknown if currently breastfeeding.  Physical Exam:  General: alert, appears stated age and no distress Lochia: appropriate Uterine Fundus: firm Incision: healing well DVT Evaluation: No evidence of DVT seen on physical exam.  Recent Labs    06/03/19 0040 06/04/19 0508  HGB 12.4 11.8*  HCT 36.1 35.0*    Assessment/Plan: Plan for discharge tomorrow, Breastfeeding and Contraception IUD- interval.     LOS: 1 day   [name] 06/04/2019, 11:35 AM

## 2019-06-05 MED ORDER — IBUPROFEN 600 MG PO TABS: 600.0000 mg | ORAL_TABLET | Freq: Four times a day (QID) | ORAL | 2 refills | Status: DC | PRN

## 2019-06-05 NOTE — Lactation Note (Signed)
This note was copied from a baby's chart. Lactation Consultation Note  Patient Name: Tina [name] [id number] Date: 06/05/2019 Reason for consult: Follow-up assessment;Term  P1 mother whose infant is now 74 hours old.  This is a term baby.  Mother has been discharged but has requested lactation for a final visit.  Mother had been feeding for approximately 20 minutes prior to my arrival.  Randel Books was in the football hold on the right breast and appeared sleepy.  Reviewed basic breast feeding questions with mother.  She stated, "I really want this to work."  Mother appears to be doing a good job with latching/feeding.  Asked mother to demonstrate latching without my assistance to observe.  She was able to latch well in the football hold; made a couple of small recommendations and baby fed for approximately 5 more minutes before becoming too sleepy to continue.  Had mother observe baby after removing her from the breast.  Baby was calm and not showing feeding cues; ready for sleep.  Mother swaddled baby and she stayed sleeping.    Parents have not yet decided on baby's name and I offered to give them some "quiet time" to work this out.  Birth certificate staff will be leaving soon and, parents will decide within this hour.  RN updated and will put a "quiet sign" on their door.    Mother has a DEBP for home use.  Father supportive.  They will call for any further questions/concerns prior to discharge.  Suggested mother call her insurance company to determine eligibility for an OP [place] visit as needed.  Mother will do this.  Family lives in [place] so will not be returning to our office.  Suggested mother check with her pediatrician's office to determine if they have [place] services.   Maternal Data Formula Feeding for Exclusion: No Has patient been taught Hand Expression?: Yes Does the patient have breastfeeding experience prior to this delivery?: No  Feeding Feeding Type: Breast Fed  LATCH  Score Latch: Grasps breast easily, tongue down, lips flanged, rhythmical sucking.  Audible Swallowing: A few with stimulation  Type of Nipple: Everted at rest and after stimulation  Comfort (Breast/Nipple): Soft / non-tender  Hold (Positioning): Assistance needed to correctly position infant at breast and maintain latch.  LATCH Score: 8  Interventions Interventions: Breast feeding basics reviewed;Assisted with latch;Skin to skin;Breast massage;Hand express;Breast compression;Position options;Support pillows  Lactation Tools Discussed/Used WIC Program: No   Consult Status Consult Status: Complete Date: 06/05/19 Follow-up type: In-patient    Little Ishikawa 06/05/2019, 12:44 PM

## 2019-06-05 NOTE — Discharge Instructions (Signed)
Postpartum Care After Vaginal Delivery °This sheet gives you information about how to care for yourself from the time you deliver your baby to up to 6-12 weeks after delivery (postpartum period). Your health care provider may also give you more specific instructions. If you have problems or questions, contact your health care provider. °Follow these instructions at home: °Vaginal bleeding °· It is normal to have vaginal bleeding (lochia) after delivery. Wear a sanitary pad for vaginal bleeding and discharge. °? During the first week after delivery, the amount and appearance of lochia is often similar to a menstrual period. °? Over the next few weeks, it will gradually decrease to a dry, yellow-brown discharge. °? For most women, lochia stops completely by 4-6 weeks after delivery. Vaginal bleeding can vary from woman to woman. °· Change your sanitary pads frequently. Watch for any changes in your flow, such as: °? A sudden increase in volume. °? A change in color. °? Large blood clots. °· If you pass a blood clot from your vagina, save it and call your health care provider to discuss. Do not flush blood clots down the toilet before talking with your health care provider. °· Do not use tampons or douches until your health care provider says this is safe. °· If you are not breastfeeding, your period should return 6-8 weeks after delivery. If you are feeding your child breast milk only (exclusive breastfeeding), your period may not return until you stop breastfeeding. °Perineal care °· Keep the area between the vagina and the anus (perineum) clean and dry as told by your health care provider. Use medicated pads and pain-relieving sprays and creams as directed. °· If you had a cut in the perineum (episiotomy) or a tear in the vagina, check the area for signs of infection until you are healed. Check for: °? More redness, swelling, or pain. °? Fluid or blood coming from the cut or tear. °? Warmth. °? Pus or a bad  smell. °· You may be given a squirt bottle to use instead of wiping to clean the perineum area after you go to the bathroom. As you start healing, you may use the squirt bottle before wiping yourself. Make sure to wipe gently. °· To relieve pain caused by an episiotomy, a tear in the vagina, or swollen veins in the anus (hemorrhoids), try taking a warm sitz bath 2-3 times a day. A sitz bath is a warm water bath that is taken while you are sitting down. The water should only come up to your hips and should cover your buttocks. °Breast care °· Within the first few days after delivery, your breasts may feel heavy, full, and uncomfortable (breast engorgement). Milk may also leak from your breasts. Your health care provider can suggest ways to help relieve the discomfort. Breast engorgement should go away within a few days. °· If you are breastfeeding: °? Wear a bra that supports your breasts and fits you well. °? Keep your nipples clean and dry. Apply creams and ointments as told by your health care provider. °? You may need to use breast pads to absorb milk that leaks from your breasts. °? You may have uterine contractions every time you breastfeed for up to several weeks after delivery. Uterine contractions help your uterus return to its normal size. °? If you have any problems with breastfeeding, work with your health care provider or lactation consultant. °· If you are not breastfeeding: °? Avoid touching your breasts a lot. Doing this can make   your breasts produce more milk. °? Wear a good-fitting bra and use cold packs to help with swelling. °? Do not squeeze out (express) milk. This causes you to make more milk. °Intimacy and sexuality °· Ask your health care provider when you can engage in sexual activity. This may depend on: °? Your risk of infection. °? How fast you are healing. °? Your comfort and desire to engage in sexual activity. °· You are able to get pregnant after delivery, even if you have not had  your period. If desired, talk with your health care provider about methods of birth control (contraception). °Medicines °· Take over-the-counter and prescription medicines only as told by your health care provider. °· If you were prescribed an antibiotic medicine, take it as told by your health care provider. Do not stop taking the antibiotic even if you start to feel better. °Activity °· Gradually return to your normal activities as told by your health care provider. Ask your health care provider what activities are safe for you. °· Rest as much as possible. Try to rest or take a nap while your baby is sleeping. °Eating and drinking ° °· Drink enough fluid to keep your urine pale yellow. °· Eat high-fiber foods every day. These may help prevent or relieve constipation. High-fiber foods include: °? Whole grain cereals and breads. °? Brown rice. °? Beans. °? Fresh fruits and vegetables. °· Do not try to lose weight quickly by cutting back on calories. °· Take your prenatal vitamins until your postpartum checkup or until your health care provider tells you it is okay to stop. °Lifestyle °· Do not use any products that contain nicotine or tobacco, such as cigarettes and e-cigarettes. If you need help quitting, ask your health care provider. °· Do not drink alcohol, especially if you are breastfeeding. °General instructions °· Keep all follow-up visits for you and your baby as told by your health care provider. Most women visit their health care provider for a postpartum checkup within the first 3-6 weeks after delivery. °Contact a health care provider if: °· You feel unable to cope with the changes that your child brings to your life, and these feelings do not go away. °· You feel unusually sad or worried. °· Your breasts become red, painful, or hard. °· You have a fever. °· You have trouble holding urine or keeping urine from leaking. °· You have little or no interest in activities you used to enjoy. °· You have not  breastfed at all and you have not had a menstrual period for 12 weeks after delivery. °· You have stopped breastfeeding and you have not had a menstrual period for 12 weeks after you stopped breastfeeding. °· You have questions about caring for yourself or your baby. °· You pass a blood clot from your vagina. °Get help right away if: °· You have chest pain. °· You have difficulty breathing. °· You have sudden, severe leg pain. °· You have severe pain or cramping in your lower abdomen. °· You bleed from your vagina so much that you fill more than one sanitary pad in one hour. Bleeding should not be heavier than your heaviest period. °· You develop a severe headache. °· You faint. °· You have blurred vision or spots in your vision. °· You have bad-smelling vaginal discharge. °· You have thoughts about hurting yourself or your baby. °If you ever feel like you may hurt yourself or others, or have thoughts about taking your own life, get help   right away. You can go to the nearest emergency department or call:  Your local emergency services (911 in the [place]  A suicide crisis helpline, such as the [place] at 630-637-5841. This is open 24 hours a day. Summary  The period of time right after you deliver your newborn up to 6-12 weeks after delivery is called the postpartum period.  Gradually return to your normal activities as told by your health care provider.  Keep all follow-up visits for you and your baby as told by your health care provider. This information is not intended to replace advice given to you by your health care provider. Make sure you discuss any questions you have with your health care provider. Document Revised: 04/10/2017 Document Reviewed: 01/19/2017 Elsevier Patient Education  [place].     Breastfeeding and Inducing Lactation Induced lactation is a process in which a woman who is not producing breast milk is made to produce it. Induced  lactation may be done in cases of:  Adoption.  Having another woman give birth to your baby (surrogacy).  Restarting breastfeeding after stopping it for a period of time.  A mother who is nursing an older toddler and wants to meet the milk supply for a newborn. Induced lactation is more likely to be successful in women who have been pregnant before. How does the body produce breast milk? The process of producing breast milk starts when you get pregnant. At this time, hormones in your body change to prepare your body to make breast milk. Once your baby is born, your hormones send signals that tell your body to make breast milk. How does induced lactation work? Induced lactation reproduces the process that the body naturally goes through to make breast milk. To help make breast milk, you may need to:  Take medicines.  Practice breast stimulation techniques. Breast stimulation techniques mimic a baby suckling at the breast. They can be done by: ? Gently rubbing and stretching your nipples. ? Using a double electric hospital-grade pump to pump your breasts. If you choose induced lactation, you may need to start taking medicines 3-4 months before you want to start breastfeeding. About 6 weeks before the planned breastfeeding start date, you may need to stop taking the medicines and start doing breast stimulation techniques several times per day. If you use a pump to pump your breasts, you may need to pump both breasts at the same time every 3 hours (8 times a day) for 20 minutes. Once your body is making milk and you start breastfeeding, your body will naturally increase the amount of milk it makes in response to the smell, sound, and feel of your baby. Will I make enough milk to feed my baby? Very few women are able to make all the milk their baby needs. If you choose induced lactation, you may need to supplement feedings with donated breast milk or infant formula to make sure your baby gets enough  nutrition. What else do I need to know?  Take medicines only as directed by your health care provider or trained lactation consultant.  Herbal medicines are available to induce lactation. These medicines are not approved or regulated by the FDA. Always check with your health care provider before using any herbal medicines.  If you need guidance, talk to your health care provider or lactation consultant. He or she may be able to help you start a milk supply and advise you in making important decisions about feeding your  baby.  Induced lactation may cause you to experience some changes in your body, such as: ? Mild to moderate changes in your menstrual cycle. ? Some breast changes that include a feeling of fullness. ? Some changes in your breast shape. ? Milk leaking from your breasts from time to time.  Supplemental nursing systems are available to provide extra donated breast milk or formula at the breast while a baby nurses. The systems ensure that an infant gets enough nutrition during breastfeeding. Ask a lactation specialist for help finding and using this device.  Newborns or babies younger than 44 month old usually root at and accept the breast when using a supplemental nursing system. Rooting is when a baby opens his or her mouth upon being stroked on the cheek or lips. Contact a health care provider if:  Your baby is older than 76 days old and: ? Does not seem satisfied after feeding at the breast. ? Is not producing 5-6 wet diapers per day. ? Is not producing 3 stools per day. Get help right away if:  Your breasts become swollen, red, and tender. Summary  Pregnancy naturally prepares the breasts to make breast milk. Induced lactation is a process in which a woman who is not producing breast milk is made to produce it.  Lactation is usually induced by taking medicines and practicing breast stimulation techniques.  Very few women are able to make all the milk their baby needs.  You may need to supplement feedings with donated breast milk or infant formula to make sure your baby gets enough nutrition. This information is not intended to replace advice given to you by your health care provider. Make sure you discuss any questions you have with your health care provider. Document Revised: 08/12/2016 Document Reviewed: 04/08/2016 Elsevier Patient Education  [place]. Breastfeeding and Medicine Use Most medicines are safe for you to take while breastfeeding because only a small amount of medicine passes into your breast milk. However, it is important to talk with your health care provider about all vaccines and medicines that you are taking while breastfeeding. These include:  Prescription medicines.  Over-the-counter medicines.  Vitamins, herbs, and supplements.  Eye drops.  Creams. What are the risks? When you are breastfeeding, small amounts of medicines that you take can pass to your baby through your breast milk. In most cases, these small amounts are not harmful to your baby. Some medicines may be present in larger amounts in your breast milk. To keep your baby safe, your health care provider might recommend that you stop breastfeeding while taking a certain medicine. You may need to stop breastfeeding for a short time or permanently. This will depend on how long you need to take the medicine. Other medicines may lessen your milk supply. Most women can continue to take those medicines for a short period of time with no effect on breastfeeding overall. Work with your health care provider or breastfeeding specialist (Science writer) to find ways to maintain your milk supply. Follow these instructions at home:  Do not stop taking a prescription medicine unless your health care provider tells you to stop. Talk with your health care provider about whether you really need to take the medicine. ? Medicines that are commonly taken after delivering a baby are  safe to take while breastfeeding. These include ibuprofen, acetaminophen, and stool softeners.  Do not take any new medicine or get any vaccine unless you have talked about it with your health care provider  and your baby's health care provider.  Always read medicine labels before using a medicine. Check for risks for women who are breastfeeding.  Try to take your medicine right after you breastfeed. This can help to limit your baby's exposure to the medicine the next time you breastfeed.  Avoid taking: ? Medicines that release slowly and stay in your body longer (long-acting medicines). ? Over-the-counter cold and allergy medicines that contain pseudoephedrine. This ingredient can lessen your milk supply. ? Medicines that are not medically necessary for you. These may include herbal medicines, high-dose vitamins, and unusual supplements. Work with your health care provider to determine which medicines you truly need.  If you start a new medicine, watch your baby for unusual signs such as sleepiness or irritability.  If you know ahead of time that you will need to stop breastfeeding for a short time, plan ahead. ? Before you start taking the medicine, pump and store a supply of breast milk. Feed this milk to your baby while you are taking the medicine. ? While you take the medicine, pump and throw away breast milk until you are no longer taking the medicine. Continuing to pump can help to make sure your body will be ready to breastfeed after you no longer need the medicine. Where to find more information  [employer] and Lactation Database (LactMed): ScreensNames.is  [place]. Food & Drug Administration: [employer]  Infant Risk Center: ? Online at [employer].infantrisk.com ? Hotline: (806) 496-7591 Contact a health care provider if:  You develop or your baby develops new symptoms after you start taking a medicine.  You have trouble producing milk or your supply  decreases.  Your baby is not gaining weight after you start taking a new medicine.  You have breast pain that does not get better with over-the-counter pain medicines. Summary  Most medicines are safe to take while breastfeeding because the amount of medicine in breast milk is too small to harm the baby.  Do not take any new medicine or get any vaccine unless you have talked about it with your health care provider and your baby's health care provider.  If you start a new medicine, watch your baby for unusual signs such as sleepiness or irritability. This information is not intended to replace advice given to you by your health care provider. Make sure you discuss any questions you have with your health care provider. Document Revised: 07/28/2018 Document Reviewed: 10/20/2016 Elsevier Patient Education  [place]. How to Keep Your Breast Pump Kit Clean Providing breast milk is one of the best things you can do for your baby's health and development. Pumping your milk is one way to provide breast milk to your baby. Keeping the parts of your pump clean is critical, because germs can grow quickly in breast milk or breast milk residue that remains on pump parts. Following these steps can keep your breast pump clean and help protect your baby from germs. If your baby was born prematurely or has other health concerns, your baby's health care providers may have more recommendations for pumping breast milk safely. The steps outlined below are based on the available scientific literature and expert opinion on breast pump hygiene. However, more research is needed to answer some questions about how to best clean breast pump equipment. BEFORE EVERY USE Wash your hands well with soap and water for 20 seconds. Inspect and assemble clean pump kit. If your tubing is moldy, discard and replace immediately.  Clean pump dials, power switch, and countertop with disinfectant wipes, especially if using a shared  pump. AFTER EVERY USE Store milk safely. Cap milk collection bottle or seal milk collection bag, label with date and time, and immediately place in a refrigerator, freezer, or cooler bag with ice packs. Clean pumping area, especially if using a shared pump. Clean the dials, power switch, and countertop with disinfectant wipes. Take apart breast pump tubing and separate all parts that come in contact with breast/breast milk. Rinse breast pump parts that come into contact with breast/breast milk by holding under running water to remove remaining milk. Do not place parts in sink to rinse. Clean pump parts that come into contact with breast/breast milk as soon as possible after pumping. You can clean your pump parts in a dishwasher or by hand in a wash basin used only for cleaning the pump kit and infant feeding items. Clean Pump Kit CLEAN BY HAND Place pump parts in a clean wash basin used only for infant feeding items. Do not place pump parts directly in the sink! Add soap and hot water to basin. Scrub items according to pump kit manufacturer's guidance. If using a brush, use a clean one that is used only to clean infant feeding items. Rinse by holding items under running water, or by submerging in fresh water in a separate basin. Air-dry thoroughly. Place pump parts, wash basin, and bottle brush on a clean, unused dish towel or paper towel in an area protected from dirt and dust. Do not use a dish towel to rub or pat items dry! Clean wash basin and bottle brush. Rinse them well and allow them to air-dry after each use. Wash them by hand or in a dishwasher at least every few days. OR CLEAN IN DISHWASHER Clean pump parts in a dishwasher, if they are dishwasher-safe. Be sure to place small items into a closed-top basket or mesh laundry bag. Add soap and, if possible, run the dishwasher using hot water and a heated drying cycle (or sanitizing setting). Remove from dishwasher with clean hands. If items  are not completely dry, place items on a clean, unused dish towel or paper towel to air-dry thoroughly before storing. Do not use a dish towel to rub or pat items dry! After Cleaning FOR EXTRA PROTECTION, SANITIZE For extra germ removal, sanitize pump parts, wash basin, and bottle brush at least once daily after they have been cleaned. Items can be sanitized using steam, boiling water, or a dishwasher with a sanitize setting. Sanitizing is especially important if your baby is less than 3 months old, was born prematurely, or has a weakened immune system due to illness or medical treatment. For detailed instructions on sanitizing your pump parts, visit [employer].dk STORE SAFELY Store dry items safely until needed. Ensure the clean pump parts, bottle brushes, and wash basins have air-dried thoroughly before storing. Items must be completely dry to help prevent germs and mold from growing. Store dry items in a clean, protected area. Learn more about safe and healthy diapering and infant feeding habits at [employer]visor.at. This information is not intended to replace advice given to you by your health care provider. Make sure you discuss any questions you have with your health care provider. Document Revised: 10/28/2018 Document Reviewed: 10/28/2018 Elsevier Patient Education  [place]. Postpartum Care After Vaginal Delivery This sheet gives you information about how to care for yourself from the time you deliver your baby to up to 6-12 weeks after delivery (  postpartum period). Your health care provider may also give you more specific instructions. If you have problems or questions, contact your health care provider. Follow these instructions at home: Vaginal bleeding  It is normal to have vaginal bleeding (lochia) after delivery. Wear a sanitary pad for vaginal bleeding and discharge. ? During the first  week after delivery, the amount and appearance of lochia is often similar to a menstrual period. ? Over the next few weeks, it will gradually decrease to a dry, yellow-brown discharge. ? For most women, lochia stops completely by 4-6 weeks after delivery. Vaginal bleeding can vary from woman to woman.  Change your sanitary pads frequently. Watch for any changes in your flow, such as: ? A sudden increase in volume. ? A change in color. ? Large blood clots.  If you pass a blood clot from your vagina, save it and call your health care provider to discuss. Do not flush blood clots down the toilet before talking with your health care provider.  Do not use tampons or douches until your health care provider says this is safe.  If you are not breastfeeding, your period should return 6-8 weeks after delivery. If you are feeding your child breast milk only (exclusive breastfeeding), your period may not return until you stop breastfeeding. Perineal care  Keep the area between the vagina and the anus (perineum) clean and dry as told by your health care provider. Use medicated pads and pain-relieving sprays and creams as directed.  If you had a cut in the perineum (episiotomy) or a tear in the vagina, check the area for signs of infection until you are healed. Check for: ? More redness, swelling, or pain. ? Fluid or blood coming from the cut or tear. ? Warmth. ? Pus or a bad smell.  You may be given a squirt bottle to use instead of wiping to clean the perineum area after you go to the bathroom. As you start healing, you may use the squirt bottle before wiping yourself. Make sure to wipe gently.  To relieve pain caused by an episiotomy, a tear in the vagina, or swollen veins in the anus (hemorrhoids), try taking a warm sitz bath 2-3 times a day. A sitz bath is a warm water bath that is taken while you are sitting down. The water should only come up to your hips and should cover your buttocks. Breast  care  Within the first few days after delivery, your breasts may feel heavy, full, and uncomfortable (breast engorgement). Milk may also leak from your breasts. Your health care provider can suggest ways to help relieve the discomfort. Breast engorgement should go away within a few days.  If you are breastfeeding: ? Wear a bra that supports your breasts and fits you well. ? Keep your nipples clean and dry. Apply creams and ointments as told by your health care provider. ? You may need to use breast pads to absorb milk that leaks from your breasts. ? You may have uterine contractions every time you breastfeed for up to several weeks after delivery. Uterine contractions help your uterus return to its normal size. ? If you have any problems with breastfeeding, work with your health care provider or Science writer.  If you are not breastfeeding: ? Avoid touching your breasts a lot. Doing this can make your breasts produce more milk. ? Wear a good-fitting bra and use cold packs to help with swelling. ? Do not squeeze out (express) milk. This causes you to  make more milk. Intimacy and sexuality  Ask your health care provider when you can engage in sexual activity. This may depend on: ? Your risk of infection. ? How fast you are healing. ? Your comfort and desire to engage in sexual activity.  You are able to get pregnant after delivery, even if you have not had your period. If desired, talk with your health care provider about methods of birth control (contraception). Medicines  Take over-the-counter and prescription medicines only as told by your health care provider.  If you were prescribed an antibiotic medicine, take it as told by your health care provider. Do not stop taking the antibiotic even if you start to feel better. Activity  Gradually return to your normal activities as told by your health care provider. Ask your health care provider what activities are safe for  you.  Rest as much as possible. Try to rest or take a nap while your baby is sleeping. Eating and drinking   Drink enough fluid to keep your urine pale yellow.  Eat high-fiber foods every day. These may help prevent or relieve constipation. High-fiber foods include: ? Whole grain cereals and breads. ? Brown rice. ? Beans. ? Fresh fruits and vegetables.  Do not try to lose weight quickly by cutting back on calories.  Take your prenatal vitamins until your postpartum checkup or until your health care provider tells you it is okay to stop. Lifestyle  Do not use any products that contain nicotine or tobacco, such as cigarettes and e-cigarettes. If you need help quitting, ask your health care provider.  Do not drink alcohol, especially if you are breastfeeding. General instructions  Keep all follow-up visits for you and your baby as told by your health care provider. Most women visit their health care provider for a postpartum checkup within the first 3-6 weeks after delivery. Contact a health care provider if:  You feel unable to cope with the changes that your child brings to your life, and these feelings do not go away.  You feel unusually sad or worried.  Your breasts become red, painful, or hard.  You have a fever.  You have trouble holding urine or keeping urine from leaking.  You have little or no interest in activities you used to enjoy.  You have not breastfed at all and you have not had a menstrual period for 12 weeks after delivery.  You have stopped breastfeeding and you have not had a menstrual period for 12 weeks after you stopped breastfeeding.  You have questions about caring for yourself or your baby.  You pass a blood clot from your vagina. Get help right away if:  You have chest pain.  You have difficulty breathing.  You have sudden, severe leg pain.  You have severe pain or cramping in your lower abdomen.  You bleed from your vagina so much that  you fill more than one sanitary pad in one hour. Bleeding should not be heavier than your heaviest period.  You develop a severe headache.  You faint.  You have blurred vision or spots in your vision.  You have bad-smelling vaginal discharge.  You have thoughts about hurting yourself or your baby. If you ever feel like you may hurt yourself or others, or have thoughts about taking your own life, get help right away. You can go to the nearest emergency department or call:  Your local emergency services (911 in the [place]  A suicide crisis helpline, such as the  National Suicide Prevention Lifeline at 925-220-7570. This is open 24 hours a day. Summary  The period of time right after you deliver your newborn up to 6-12 weeks after delivery is called the postpartum period.  Gradually return to your normal activities as told by your health care provider.  Keep all follow-up visits for you and your baby as told by your health care provider. This information is not intended to replace advice given to you by your health care provider. Make sure you discuss any questions you have with your health care provider. Document Revised: 04/10/2017 Document Reviewed: 01/19/2017 Elsevier Patient Education  2020 [employer].

## 2019-06-05 NOTE — Lactation Note (Signed)
This note was copied from a baby's chart. Lactation Consultation Note  Patient Name: Tina [name] Today's Date: 06/05/2019  Took Mom her Cone Breastpump and initiated pumping with DEBP with mom.  Infant fussy and comes off and on the breast and has a hard time maintaining latch.  Mom with flat/short nipples that do evert slightly with stimulation. Do appear to be coming out more with shells, and prepumping, however infant still struggling to maintain. Demo laid back breastfeeding with mom and she does maintain for a few more sucks when mom lays back. Urged mom to hand express and pump past each 2-3 hour breastfeeding and feed back all expressed breastmilk.  Gave mom supplemenation sheet for recommended amounts. Left mom and baby working on breastfeeding.  Urged to call lactation as needed   Maternal Data    Feeding    LATCH Score                   Interventions    Lactation Tools Discussed/Used     Consult Status      Tina [name] 06/05/2019, 12:25 AM

## 2019-06-07 LAB — SURGICAL PATHOLOGY

## 2019-06-08 ENCOUNTER — Encounter: Payer: Self-pay | Admitting: Obstetrics & Gynecology

## 2019-06-08 ENCOUNTER — Other Ambulatory Visit: Payer: Self-pay

## 2019-06-08 ENCOUNTER — Ambulatory Visit (INDEPENDENT_AMBULATORY_CARE_PROVIDER_SITE_OTHER): Payer: 59 | Admitting: Obstetrics & Gynecology

## 2019-06-08 VITALS — BP 118/72 | HR 70 | Wt 221.0 lb

## 2019-06-08 DIAGNOSIS — G8918 Other acute postprocedural pain: Secondary | ICD-10-CM

## 2019-06-08 DIAGNOSIS — O9089 Other complications of the puerperium, not elsewhere classified: Secondary | ICD-10-CM

## 2019-06-08 NOTE — Progress Notes (Signed)
History:  39 y.o. G1P1001 here today for eval of her episiotomy repair.  She reports that she is worried that something separated when she had a BM. She also did not expect it to hurt. She reports th ather milk has come in and the baby's jaundice has improved.      The following portions of the patient's history were reviewed and updated as appropriate: allergies, current medications, past family history, past medical history, past social history, past surgical history and problem list.  Review of Systems:  Pertinent items are noted in HPI.    Objective:  Physical Exam Blood pressure 118/72, pulse 70, weight 221 lb (100.2 kg), last menstrual period 09/03/2018, unknown if currently breastfeeding.  CONSTITUTIONAL: Well-developed, well-nourished female in no acute distress.  HENT:  Normocephalic, atraumatic EYES: Conjunctivae and EOM are normal. No scleral icterus.  NECK: Normal range of motion SKIN: Skin is warm and dry. No rash noted. Not diaphoretic.No pallor. [place]: Alert and oriented to person, place, and time. Normal coordination.  Pelvic: Normal appearing external genitalia- the episiotomy is healing as expected. There is no erythema or purulence. It re approximates well.    Assessment & Plan:  Episiotomy pain/concern- this is healing well without complications.  Reviewed routine care  Discussed with pt finding time for self care. She is to text me this info within the week   F/u in 4-5 weeks or sooner prn   Plans for LnIUD placement.   [place] L. Harraway-Smith, M.D., Cherlynn June

## 2019-06-28 ENCOUNTER — Other Ambulatory Visit: Payer: Self-pay

## 2019-06-28 MED ORDER — FLUCONAZOLE 150 MG PO TABS: ORAL_TABLET | ORAL | 0 refills | Status: DC

## 2019-06-28 NOTE — Progress Notes (Signed)
Patient called and is seeing lactation through [facility]. Patient states she was advise to take a diflucan regimen for ductal breast yeast.   Called Dr. Ihor Dow who gave order for diflucan 150mg  every three days by mouth for a total of three doses. Kathrene Alu RN

## 2019-06-29 ENCOUNTER — Other Ambulatory Visit: Payer: Self-pay

## 2019-06-29 DIAGNOSIS — E039 Hypothyroidism, unspecified: Secondary | ICD-10-CM

## 2019-06-29 MED ORDER — LEVOTHYROXINE SODIUM 150 MCG PO TABS: 150.0000 ug | ORAL_TABLET | Freq: Every day | ORAL | 3 refills | Status: DC

## 2019-07-01 ENCOUNTER — Other Ambulatory Visit: Payer: Self-pay

## 2019-07-01 ENCOUNTER — Ambulatory Visit (INDEPENDENT_AMBULATORY_CARE_PROVIDER_SITE_OTHER): Payer: 59 | Admitting: Obstetrics & Gynecology

## 2019-07-01 ENCOUNTER — Encounter: Payer: Self-pay | Admitting: Obstetrics & Gynecology

## 2019-07-01 VITALS — BP 119/68 | HR 74 | Wt 220.0 lb

## 2019-07-01 DIAGNOSIS — N760 Acute vaginitis: Secondary | ICD-10-CM | POA: Diagnosis not present

## 2019-07-01 DIAGNOSIS — B9689 Other specified bacterial agents as the cause of diseases classified elsewhere: Secondary | ICD-10-CM | POA: Diagnosis not present

## 2019-07-01 DIAGNOSIS — N898 Other specified noninflammatory disorders of vagina: Secondary | ICD-10-CM | POA: Diagnosis not present

## 2019-07-01 DIAGNOSIS — Z30430 Encounter for insertion of intrauterine contraceptive device: Secondary | ICD-10-CM | POA: Diagnosis not present

## 2019-07-01 MED ORDER — LEVONORGESTREL 20 MCG/24HR IU IUD
INTRAUTERINE_SYSTEM | Freq: Once | INTRAUTERINE | Status: AC
  Administered 2019-07-01: 1 via INTRAUTERINE

## 2019-07-01 NOTE — Progress Notes (Signed)
Post Partum Exam  Tina [name] is a 39 y.o. G65P1001 female who presents for a postpartum visit. She is 4 weeks postpartum following a spontaneous vaginal delivery. I have fully reviewed the prenatal and intrapartum course. The delivery was at 77 gestational weeks.  Anesthesia: epidural. Postpartum course has been uneventful. Baby's course has been uneventful. Baby is feeding by breast. Bleeding moderate lochia. Bowel function is normal. Bladder function is normal. Patient is not sexually active. Contraception method is IUD. Postpartum depression screening:neg Score 3 Last pap smear done 11/01/18 and was Normal   Pt reports itching in the vaginal area which has improved since she started Diflucan for nipple thrush. She reports pain with breastfeeding that has improved since seeing a Science writer.   Review of Systems Pertinent items are noted in HPI.    Objective:  BP 119/68   Pulse 74   Wt 220 lb (99.8 kg)   LMP 09/03/2018   BMI 32.49 kg/m   CONSTITUTIONAL: Well-developed, well-nourished female in no acute distress.  HENT:  Normocephalic, atraumatic EYES: Conjunctivae and EOM are normal. No scleral icterus.  NECK: Normal range of motion SKIN: Skin is warm and dry. No rash noted. Not diaphoretic.No pallor. [place]: Alert and oriented to person, place, and time. Normal coordination.  GU: EGBUS: no lesions; the perineum is healing well Vagina: no blood in vault Cervix: no lesion; no mucopurulent d/c   IUD Insertion Procedure Note Patient identified, informed consent performed.  Discussed risks of irregular bleeding, cramping, infection, malpositioning or misplacement of the IUD outside the uterus which may require further procedures. Time out was performed.  Urine pregnancy test negative.  Speculum placed in the vagina.  Cervix visualized.  Cleaned with Betadine x 2.  Grasped anteriorly with a single tooth tenaculum.  Uterus sounded to 10 cm.  Mirena IUD placed per  manufacturer's recommendations.  Strings trimmed to 3 cm. Tenaculum was removed, good hemostasis noted.  Patient tolerated procedure well.   Assessment:    4 week postpartum exam. Pap smear not done at today's visit.  Contraception counseling - plans for LnIUD. Has had Mirena prev.   Plan:   1. Contraception: IUD 2. Patient was given post-procedure instructions.  Patient was asked to follow up in 4 weeks for IUD check.3. Follow up in: 4 weeks or as needed.   Osmin Welz L. Harraway-Smith, M.D., Cherlynn June

## 2019-07-01 NOTE — Patient Instructions (Signed)

## 2019-07-04 ENCOUNTER — Other Ambulatory Visit: Payer: Self-pay

## 2019-07-04 ENCOUNTER — Ambulatory Visit (INDEPENDENT_AMBULATORY_CARE_PROVIDER_SITE_OTHER): Payer: 59 | Admitting: Obstetrics & Gynecology

## 2019-07-04 ENCOUNTER — Encounter: Payer: Self-pay | Admitting: Obstetrics & Gynecology

## 2019-07-04 DIAGNOSIS — O99893 Other specified diseases and conditions complicating puerperium: Secondary | ICD-10-CM

## 2019-07-04 DIAGNOSIS — O9229 Other disorders of breast associated with pregnancy and the puerperium: Secondary | ICD-10-CM | POA: Diagnosis not present

## 2019-07-04 LAB — CERVICOVAGINAL ANCILLARY ONLY
Bacterial Vaginitis (gardnerella): POSITIVE — AB
Candida Glabrata: NEGATIVE
Candida Vaginitis: NEGATIVE
Comment: NEGATIVE
Comment: NEGATIVE
Comment: NEGATIVE

## 2019-07-04 MED ORDER — FLUCONAZOLE 150 MG PO TABS: ORAL_TABLET | ORAL | 0 refills | Status: DC

## 2019-07-04 MED ORDER — NIFEDIPINE 10 MG PO CAPS: 10.0000 mg | ORAL_CAPSULE | Freq: Three times a day (TID) | ORAL | 0 refills | Status: DC | PRN

## 2019-07-04 NOTE — Progress Notes (Signed)
History:  39 y.o. G1P1001 here today for nipple pain. Pt was seen by the lactation consultant and advised that she might have yeast or Raynaud's of the nipple. Pt noted over the weekend that her nipple became white than blue than red and was assoc with 'searing pain'.  She has been on Diflucan and does feel that her sx have imporved overall. She is worried about the long tern effects if it is Raynauds.   The following portions of the patient's history were reviewed and updated as appropriate: allergies, current medications, past family history, past medical history, past social history, past surgical history and problem list.  Review of Systems:  Pertinent items are noted in HPI.    Objective:  Physical Exam Last menstrual period 09/03/2018, unknown if currently breastfeeding. LMP 09/03/2018   CONSTITUTIONAL: Well-developed, well-nourished female in no acute distress.  HENT:  Normocephalic, atraumatic EYES: Conjunctivae and EOM are normal. No scleral icterus.  NECK: Normal range of motion SKIN: Skin is warm and dry. No rash noted. Not diaphoretic.No pallor. [place]: Alert and oriented to person, place, and time. Normal coordination.  Breast exam: breasts symmetric. No masses noted. The nipples are erythematous and a bit cracked. Application of ice to the nipples for 2 minutes do not yield changes in coloration or reproduce sx.   Assessment & Plan:  Lactation issues/nipple pain- I suspect yeast of the nipples. Pt is on the 3rd diflucan today. Given the sx that pt describes at home, I cannot fully exclude Raynaud's however, those sx were not reproducible.   Rec cont Diflucan 150mg  po q 3 days for 3 additional doses.  I have written a Rx for Procardia in the event that the sx described return. Pt is to take those only as needed.   F/u via telephone or text in 1 week or sooner prn  Imoni Kohen L. Harraway-Smith, M.D., Cherlynn June

## 2019-07-05 MED ORDER — METRONIDAZOLE 500 MG PO TABS: 500.0000 mg | ORAL_TABLET | Freq: Two times a day (BID) | ORAL | 0 refills | Status: DC

## 2019-07-05 NOTE — Addendum Note (Signed)
Addended by: [name] on: 07/05/2019 02:20 PM   Modules accepted: Orders

## 2019-07-21 ENCOUNTER — Ambulatory Visit (INDEPENDENT_AMBULATORY_CARE_PROVIDER_SITE_OTHER): Payer: 59 | Admitting: Osteopathic Medicine

## 2019-07-21 ENCOUNTER — Other Ambulatory Visit: Payer: Self-pay

## 2019-07-21 ENCOUNTER — Encounter: Payer: Self-pay | Admitting: Osteopathic Medicine

## 2019-07-21 VITALS — BP 102/70 | HR 105 | Temp 98.0°F | Wt 219.0 lb

## 2019-07-21 DIAGNOSIS — Z0000 Encounter for general adult medical examination without abnormal findings: Secondary | ICD-10-CM | POA: Diagnosis not present

## 2019-07-21 DIAGNOSIS — O9229 Other disorders of breast associated with pregnancy and the puerperium: Secondary | ICD-10-CM

## 2019-07-21 DIAGNOSIS — E039 Hypothyroidism, unspecified: Secondary | ICD-10-CM

## 2019-07-21 MED ORDER — LEVOTHYROXINE SODIUM 150 MCG PO TABS: 150.0000 ug | ORAL_TABLET | Freq: Every day | ORAL | 0 refills | Status: DC

## 2019-07-21 MED ORDER — NIFEDIPINE 10 MG PO CAPS: 10.0000 mg | ORAL_CAPSULE | Freq: Three times a day (TID) | ORAL | 0 refills | Status: DC | PRN

## 2019-07-21 NOTE — Patient Instructions (Signed)
General Preventive Care  Most recent routine screening labs and thyroid - ordered today   Tobacco: don't! Alcohol: responsible moderation is ok for most adults - if you have concerns about your alcohol intake, please talk to me!   Exercise: as tolerated to reduce risk of cardiovascular disease and diabetes. Strength training will also prevent osteoporosis.   Mental health: if need for mental health care (medicines, counseling, other), or concerns about moods, please let me know!   Sexual health: if need for STD testing, or if concerns with libido/pain problems, please let me or OBGYN know! If you need to discuss your birth control options, please let me or OBGYN know!   Advanced Directive: Living Will and/or Healthcare Power of Attorney recommended for all adults, regardless of age or health.  Vaccines  Flu vaccine: for almost everyone, every fall.   Shingles vaccine: after age 60.   Pneumonia vaccines: after age 70  Tetanus booster: every 10 years / 3rd trimester of pregnancy  COVID vaccine: in process, yay!  Cancer screenings   Colon cancer screening: age 61 given polyp history   Breast cancer screening: mammogram at age 10   Cervical cancer screening: per OBGYN   Lung cancer screening: not needed for non-smokers Infection screenings  . HIV, Gonorrhea/Chlamydia: screening as needed . Hepatitis C: recommended once for everyone age 76-75 . TB: certain at-risk populations, or depending on work requirements and/or travel history Other . Bone Density Test: recommended for women at age 70

## 2019-07-21 NOTE — Progress Notes (Signed)
HPI: Tina [name] is a 39 y.o. female who  has a past medical history of Angio-edema, Hypothyroidism, Levothyroxine only (03/15/2017), Mirena IUD (intrauterine device) in place (03/15/2017), and Urticaria.  she presents to [facility] today, 07/21/19,  for chief complaint of: New to establish Annual physical   New patient here to establish care Works as OBGYN physician  No concerns/problems today Chronic issues: hypothyroid Breast pain, managed by OBGYN/lactation. PRN Nifedipine needs refilled  Recent pregnancy & delivery, no GDM, HTN   Past medical, surgical, social and family history reviewed:  Patient Active Problem List   Diagnosis Date Noted  . Advanced maternal age, primigravida 06/03/2019  . Infection in pregnancy, antepartum 01/03/2019  . Supervision of high risk pregnancy, antepartum 11/01/2018  . Advanced maternal age, primigravida, antepartum 11/01/2018  . Twin reversed-arterial perfusion (TRAP) sequence pregnancy, antepartum 11/01/2018  . Paresthesias 04/01/2018  . Nevus of scalp 01/07/2018  . 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 Melasma 01/07/2018  . 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 Hypothyroid in pregnancy, antepartum 03/15/2017  . History of rectal bleeding 03/15/2017    Past Surgical History:  Procedure Laterality Date  . NO PAST SURGERIES      Social History   Tobacco Use  . Smoking status: Never Smoker  . Smokeless tobacco: Never Used  Substance Use Topics  . Alcohol use: Not Currently    Comment: Social    Family History  Problem Relation Age of Onset  . Arthritis Mother   . Leukemia Mother   . Other Sister        accessory pathways in heart  . Stroke Maternal Grandmother   . Stroke Paternal Grandmother   . Stroke Paternal Grandfather      Current medication list and allergy/intolerance information reviewed:    Current Outpatient Medications  Medication Sig Dispense Refill  .  levonorgestrel (MIRENA) 20 MCG/24HR IUD 1 each by Intrauterine route once.    Marland Kitchen levothyroxine (SYNTHROID) 150 MCG tablet Take 1 tablet (150 mcg total) by mouth daily before breakfast. 90 tablet 0  . NIFEdipine (PROCARDIA) 10 MG capsule Take 1 capsule (10 mg total) by mouth 3 (three) times daily as needed. 30 capsule 0  . polyethylene glycol (MIRALAX / GLYCOLAX) 17 g packet Take 17 g by mouth daily.    . Psyllium (METAMUCIL PO) Take 1 Scoop by mouth daily. 1 tablespoon.    Marland Kitchen acetaminophen (TYLENOL) 500 MG tablet Take 1,000 mg by mouth every 6 (six) hours as needed for mild pain or headache.    . Prenatal Vit-Fe Fumarate-FA (PRENATAL MULTIVITAMIN) TABS tablet Take 1 tablet by mouth at bedtime.      No current facility-administered medications for this visit.    Allergies  Allergen Reactions  . Augmentin [Amoxicillin-Pot Clavulanate] Rash  . Penicillins Other (See Comments)    Per allergist Causes inflammation of the blood vessels. Did it involve swelling of the face/tongue/throat, SOB, or low BP? No Did it involve sudden or severe rash/hives, skin peeling, or any reaction on the inside of your mouth or nose? No Did you need to seek medical attention at a hospital or doctor's office? Yes When did it last happen? Summer of 2017 If all above answers are "NO", may proceed with cephalosporin use.       Review of Systems:  Constitutional:  No  fever, no chills, No recent illness  HEENT: No  headache, no vision change  Cardiac: No  chest pain, No  pressure  Respiratory:  No  shortness of breath. No  Cough  Gastrointestinal: No  abdominal pain, No  nausea, No  vomiting,  No  blood in stool, No  diarrhea, No  constipation   Musculoskeletal: No new myalgia/arthralgia  Skin: No  Rash, No other wounds/concerning lesions  Genitourinary: No  incontinence, No  abnormal genital bleeding, No abnormal genital discharge  Hem/Onc: No  easy bruising/bleeding, No  abnormal lymph  node  Endocrine: No cold intolerance,  No heat intolerance. No polyuria/polydipsia/polyphagia   Neurologic: No  weakness, No  dizziness  Psychiatric: No  concerns with depression, No  concerns with anxiety, No sleep problems, No mood problems  Exam:  BP 102/70 (BP Location: Right Arm, Patient Position: Sitting, Cuff Size: Normal)   Pulse (!) 105   Temp 98 F (36.7 C) (Oral)   Wt 219 lb 0.6 oz (99.4 kg)   LMP 07/01/2019   BMI 32.35 kg/m   Constitutional: VS see above. General Appearance: alert, well-developed, well-nourished, NAD  Eyes: Normal lids and conjunctive, non-icteric sclera  Ears, Nose, Mouth, Throat: Mask in place   Neck: No masses, trachea midline. No thyroid enlargement. No tenderness/mass appreciated. No lymphadenopathy  Respiratory: Normal respiratory effort. no wheeze, no rhonchi, no rales  Cardiovascular: S1/S2 normal, no murmur, no rub/gallop auscultated. RRR.  Musculoskeletal: Gait normal  Neurological: Normal balance/coordination. No tremor.   Skin: warm, dry, intact.   Psychiatric: Normal judgment/insight. Normal mood and affect. Oriented x3.    Results for orders placed or performed in visit on 07/21/19 (from the past 72 hour(s))  CBC     Status: None   Collection Time: 07/21/19  2:22 PM  Result Value Ref Range   WBC 7.3 3.8 - 10.8 Thousand/uL   RBC 4.13 3.80 - 5.10 Million/uL   Hemoglobin 12.9 11.7 - 15.5 g/dL   HCT 38.7 35.0 - 45.0 %   MCV 93.7 80.0 - 100.0 fL   MCH 31.2 27.0 - 33.0 pg   MCHC 33.3 32.0 - 36.0 g/dL   RDW 11.9 11.0 - 15.0 %   Platelets 317 140 - 400 Thousand/uL   MPV 10.6 7.5 - 12.5 fL  COMPLETE METABOLIC PANEL WITH GFR     Status: None   Collection Time: 07/21/19  2:22 PM  Result Value Ref Range   Glucose, Bld 89 65 - 99 mg/dL    Comment: .            Fasting reference interval .    BUN 14 7 - 25 mg/dL   Creat 0.93 0.50 - 1.10 mg/dL   GFR, Est Non African American 79 > OR = 60 mL/min/1.64m2   GFR, Est African  American 91 > OR = 60 mL/min/1.51m2   BUN/Creatinine Ratio NOT APPLICABLE 6 - 22 (calc)   Sodium 141 135 - 146 mmol/L   Potassium 4.2 3.5 - 5.3 mmol/L   Chloride 106 98 - 110 mmol/L   CO2 26 20 - 32 mmol/L   Calcium 9.7 8.6 - 10.2 mg/dL   Total Protein 7.1 6.1 - 8.1 g/dL   Albumin 4.4 3.6 - 5.1 g/dL   Globulin 2.7 1.9 - 3.7 g/dL (calc)   AG Ratio 1.6 1.0 - 2.5 (calc)   Total Bilirubin 0.4 0.2 - 1.2 mg/dL   Alkaline phosphatase (APISO) 58 31 - 125 U/L   AST 17 10 - 30 U/L   ALT 22 6 - 29 U/L  Lipid panel     Status: None   Collection  Time: 07/21/19  2:22 PM  Result Value Ref Range   Cholesterol 168 <200 mg/dL   HDL 66 > OR = 50 mg/dL   Triglycerides 107 <150 mg/dL   LDL Cholesterol (Calc) 82 mg/dL (calc)    Comment: Reference range: <100 . Desirable range <100 mg/dL for primary prevention;   <70 mg/dL for patients with CHD or diabetic patients  with > or = 2 CHD risk factors. Marland Kitchen LDL-C is now calculated using the Martin-Hopkins  calculation, which is a validated novel method providing  better accuracy than the Friedewald equation in the  estimation of LDL-C.  Cresenciano Genre et al. Annamaria Helling. [id number]): 2061-2068  (http://education.[facility].com/faq/FAQ164)    Total CHOL/HDL Ratio 2.5 <5.0 (calc)   Non-HDL Cholesterol (Calc) 102 <130 mg/dL (calc)    Comment: For patients with diabetes plus 1 major ASCVD risk  factor, treating to a non-HDL-C goal of <100 mg/dL  (LDL-C of <70 mg/dL) is considered a therapeutic  option.   TSH     Status: Abnormal   Collection Time: 07/21/19  2:22 PM  Result Value Ref Range   TSH 0.07 (L) mIU/L    Comment:           Reference Range .           > or = 20 Years  0.40-4.50 .                Pregnancy Ranges           First trimester    0.26-2.66           Second trimester   0.55-2.73           Third trimester    0.43-2.91     No results found.   ASSESSMENT/PLAN: The primary encounter diagnosis was Annual physical exam. Diagnoses of  Acquired hypothyroidism, Postpartum nipple pain, and Acquired hypothyroidism were also pertinent to this visit.   Will need to adjust thyroid Rx, repeat labs in 6-8 weeks   Orders Placed This Encounter  Procedures  . CBC  . COMPLETE METABOLIC PANEL WITH GFR  . Lipid panel  . TSH    Meds ordered this encounter  Medications  . NIFEdipine (PROCARDIA) 10 MG capsule    Sig: Take 1 capsule (10 mg total) by mouth 3 (three) times daily as needed.    Dispense:  30 capsule    Refill:  0  . levothyroxine (SYNTHROID) 150 MCG tablet    Sig: Take 1 tablet (150 mcg total) by mouth daily before breakfast.    Dispense:  90 tablet    Refill:  0    Patient Instructions  General Preventive Care  Most recent routine screening labs and thyroid - ordered today   Tobacco: don't! Alcohol: responsible moderation is ok for most adults - if you have concerns about your alcohol intake, please talk to me!   Exercise: as tolerated to reduce risk of cardiovascular disease and diabetes. Strength training will also prevent osteoporosis.   Mental health: if need for mental health care (medicines, counseling, other), or concerns about moods, please let me know!   Sexual health: if need for STD testing, or if concerns with libido/pain problems, please let me or OBGYN know! If you need to discuss your birth control options, please let me or OBGYN know!   Advanced Directive: Living Will and/or Healthcare Power of Attorney recommended for all adults, regardless of age or health.  Vaccines  Flu vaccine: for almost everyone, every fall.  Shingles vaccine: after age 59.   Pneumonia vaccines: after age 49  Tetanus booster: every 10 years / 3rd trimester of pregnancy  COVID vaccine: in process, yay!  Cancer screenings   Colon cancer screening: age 59 given polyp history   Breast cancer screening: mammogram at age 7   Cervical cancer screening: per OBGYN   Lung cancer screening: not needed for  non-smokers Infection screenings  . HIV, Gonorrhea/Chlamydia: screening as needed . Hepatitis C: recommended once for everyone age 63-75 . TB: certain at-risk populations, or depending on work requirements and/or travel history Other . Bone Density Test: recommended for women at age 76       Visit summary with medication list and pertinent instructions was printed for patient to review. All questions at time of visit were answered - patient instructed to contact office with any additional concerns or updates. ER/RTC precautions were reviewed with the patient.     Please note: voice recognition software was used to produce this document, and typos may escape review. Please contact Dr. Sheppard Coil for any needed clarifications.     Follow-up plan: Return in about 1 year (around 07/20/2020) for [facility] (call week prior to visit for lab orders).

## 2019-07-22 ENCOUNTER — Encounter: Payer: Self-pay | Admitting: Osteopathic Medicine

## 2019-07-22 LAB — COMPLETE METABOLIC PANEL WITH GFR
AG Ratio: 1.6 (calc) (ref 1.0–2.5)
ALT: 22 U/L (ref 6–29)
AST: 17 U/L (ref 10–30)
Albumin: 4.4 g/dL (ref 3.6–5.1)
Alkaline phosphatase (APISO): 58 U/L (ref 31–125)
BUN: 14 mg/dL (ref 7–25)
CO2: 26 mmol/L (ref 20–32)
Calcium: 9.7 mg/dL (ref 8.6–10.2)
Chloride: 106 mmol/L (ref 98–110)
Creat: 0.93 mg/dL (ref 0.50–1.10)
GFR, Est African American: 91 mL/min/{1.73_m2} (ref >=60)
GFR, Est Non African American: 79 mL/min/{1.73_m2} (ref >=60)
Globulin: 2.7 g/dL (calc) (ref 1.9–3.7)
Glucose, Bld: 89 mg/dL (ref 65–99)
Potassium: 4.2 mmol/L (ref 3.5–5.3)
Sodium: 141 mmol/L (ref 135–146)
Total Bilirubin: 0.4 mg/dL (ref 0.2–1.2)
Total Protein: 7.1 g/dL (ref 6.1–8.1)

## 2019-07-22 LAB — TSH: TSH: 0.07 mIU/L — ABNORMAL LOW

## 2019-07-22 LAB — CBC
HCT: 38.7 % (ref 35.0–45.0)
Hemoglobin: 12.9 g/dL (ref 11.7–15.5)
MCH: 31.2 pg (ref 27.0–33.0)
MCHC: 33.3 g/dL (ref 32.0–36.0)
MCV: 93.7 fL (ref 80.0–100.0)
MPV: 10.6 fL (ref 7.5–12.5)
Platelets: 317 10*3/uL (ref 140–400)
RBC: 4.13 10*6/uL (ref 3.80–5.10)
RDW: 11.9 % (ref 11.0–15.0)
WBC: 7.3 10*3/uL (ref 3.8–10.8)

## 2019-07-22 LAB — LIPID PANEL
Cholesterol: 168 mg/dL (ref <200)
HDL: 66 mg/dL (ref >=50)
LDL Cholesterol (Calc): 82 mg/dL (calc)
Non-HDL Cholesterol (Calc): 102 mg/dL (calc) (ref <130)
Total CHOL/HDL Ratio: 2.5 (calc) (ref <5.0)
Triglycerides: 107 mg/dL (ref <150)

## 2019-07-22 MED ORDER — LEVOTHYROXINE SODIUM 137 MCG PO TABS: 137.0000 ug | ORAL_TABLET | Freq: Every day | ORAL | 0 refills | Status: DC

## 2019-07-22 NOTE — Addendum Note (Signed)
Addended by: Maryla Morrow on: 07/22/2019 10:30 AM   Modules accepted: Orders

## 2019-08-01 ENCOUNTER — Ambulatory Visit (INDEPENDENT_AMBULATORY_CARE_PROVIDER_SITE_OTHER): Payer: 59 | Admitting: Obstetrics & Gynecology

## 2019-08-01 ENCOUNTER — Encounter: Payer: Self-pay | Admitting: Obstetrics & Gynecology

## 2019-08-01 ENCOUNTER — Other Ambulatory Visit: Payer: Self-pay

## 2019-08-01 VITALS — BP 123/78 | HR 73 | Wt 217.0 lb

## 2019-08-01 DIAGNOSIS — R52 Pain, unspecified: Secondary | ICD-10-CM

## 2019-08-01 DIAGNOSIS — Z30431 Encounter for routine checking of intrauterine contraceptive device: Secondary | ICD-10-CM

## 2019-08-01 DIAGNOSIS — O9279 Other disorders of lactation: Secondary | ICD-10-CM | POA: Insufficient documentation

## 2019-08-01 NOTE — Progress Notes (Signed)
  GYNECOLOGY OFFICE ENCOUNTER NOTE  History:  39 y.o. G1P1001 here today for today for IUD string check; Mirena  IUD was placed  07/01/2019. Pt reports that her partner feels the strings during intercourse. No concerning side effects.  The following portions of the patient's history were reviewed and updated as appropriate: allergies, current medications, past family history, past medical history, past social history, past surgical history and problem list.   Review of Systems:  Pertinent items are noted in HPI.   Objective:  Physical Exam Blood pressure 123/78, pulse 73, weight 217 lb (98.4 kg), last menstrual period 09/03/2018, currently breastfeeding. CONSTITUTIONAL: Well-developed, well-nourished female in no acute distress.  HENT:  Normocephalic, atraumatic. External right and left ear normal. Oropharynx is clear and moist EYES: Conjunctivae and EOM are normal. Pupils are equal, round, and reactive to light. No scleral icterus.  NECK: Normal range of motion, supple, no masses CARDIOVASCULAR: Normal heart rate noted RESPIRATORY: Effort and breath sounds normal, no problems with respiration noted ABDOMEN: Soft, no distention noted.   PELVIC: Normal appearing external genitalia; normal appearing vaginal mucosa and cervix.  IUD strings visualized, about 3 cm in length outside cervix.  Strings trimmed.   Assessment & Plan:  Patient to keep IUD in place for up to seven years; can come in for removal if she desires pregnancy earlier or for any concerning side effects. Reviewed breast feeding concerns. Pt taking Procardia 10mg  prn for Raynaud of the nipple.   Ryland Tungate L. Harraway-Smith, MD, [place], [facility] for [employer], [place]

## 2019-08-01 NOTE — Progress Notes (Signed)
Patient presents for string check. Delvon Chipps RN  ?

## 2019-09-08 DIAGNOSIS — E039 Hypothyroidism, unspecified: Secondary | ICD-10-CM | POA: Diagnosis not present

## 2019-09-09 ENCOUNTER — Other Ambulatory Visit: Payer: Self-pay | Admitting: Osteopathic Medicine

## 2019-09-09 ENCOUNTER — Encounter: Payer: Self-pay | Admitting: Osteopathic Medicine

## 2019-09-09 LAB — TSH: TSH: 0.12 mIU/L — ABNORMAL LOW

## 2019-09-09 MED ORDER — LEVOTHYROXINE SODIUM 125 MCG PO TABS: 125.0000 ug | ORAL_TABLET | Freq: Every day | ORAL | 0 refills | Status: DC

## 2019-10-03 ENCOUNTER — Telehealth: Payer: Self-pay

## 2019-10-03 DIAGNOSIS — R102 Pelvic and perineal pain: Secondary | ICD-10-CM

## 2019-10-03 NOTE — Telephone Encounter (Signed)
-----   Message from [name], MD sent at 10/03/2019 11:25 AM EDT ----- Please refer for pelvic PT.   Thanks   [facility]

## 2019-10-17 ENCOUNTER — Ambulatory Visit: Payer: 59 | Attending: Obstetrics & Gynecology | Admitting: Physical Therapy

## 2019-10-17 ENCOUNTER — Other Ambulatory Visit: Payer: Self-pay

## 2019-10-17 ENCOUNTER — Encounter: Payer: Self-pay | Admitting: Physical Therapy

## 2019-10-17 DIAGNOSIS — R252 Cramp and spasm: Secondary | ICD-10-CM | POA: Diagnosis not present

## 2019-10-17 DIAGNOSIS — M6281 Muscle weakness (generalized): Secondary | ICD-10-CM | POA: Diagnosis not present

## 2019-10-17 NOTE — Patient Instructions (Addendum)
STRETCHING THE PELVIC FLOOR MUSCLES NO DILATOR  Supplies . Vaginal lubricant . Mirror (optional) . Gloves (optional) or clean hands Positioning . Start in a semi-reclined position with your head propped up. Bend your knees and place your thumb or finger at the vaginal opening. Procedure . Apply a moderate amount of lubricant on the outer skin of your vagina, the labia minora.  Apply additional lubricant to your finger. Marland Kitchen Spread the skin away from the vaginal opening. Place the end of your finger at the opening. . Do a maximum contraction of the pelvic floor muscles. Tighten the vagina and the anus maximally and relax. . When you know they are relaxed, gently and slowly insert your finger into your vagina, directing your finger slightly downward, for 2-3 inches of insertion. . Relax and stretch the 6 o'clock position . Hold each stretch for _30-60 seconds, no pain more than 3/10 . Repeat the stretching in the 4 o'clock and 8 o'clock positions. . Next gently move your finger in a "U" shape  several times.  . You can also enter a second finger to work to spread the vaginal opening wider from 3:00-6:00 and 6:00-9:00 or 3:00-9:00 . Perform daily or every other day . Once you have accomplished the techniques you may try them in standing with one foot resting on the tub, or in other positions.  This is a good stretch to do in the shower if you don't need to use lubricant.  This can be done at 35 weeks or later in your pregnancy.    [facility] Vmagic Organic Vulva Cream vaginal moisturizer Estrogen Free, Fragrance Free 2 Fl Oz    Lubrication . Used for intercourse to reduce friction . Avoid ones that have glycerin, warming gels, tingling gels, icing or cooling gel, scented . Avoid parabens due to a preservative similar to female sex hormone . May need to be reapplied once or several times during sexual activity . Can be applied to both partners genitals prior to vaginal  penetration to minimize friction or irritation . Prevent irritation and mucosal tears that cause post coital pain and increased the risk of vaginal and urinary tract infections . Oil-based lubricants cannot be used with condoms due to breaking them down.  Least likely to irritate vaginal tissue.  . Plant based-lubes are safe . Silicone-based lubrication are thicker and last long and used for post-menopausal women  Vaginal Lubricators Here is a list of some suggested lubricators you can use for intercourse. Use the most hypoallergenic product.  You can place on you or your partner.   Slippery Stuff ( water based)  Sylk or Sliquid Natural H2O ( good  if frequent UTI's)- walmart, amazon  Sliquid organics silk-(aloe and silicone based )  [employer] (www.blossom-organics.com)- (aloe based )  Coconut oil, olive oil -not good with condoms   PJur Woman Nude- (water based) amazon  Uberlube- ( silicon) [place] has an organic one  Yes lubricant- (water based and has plant oil based similar to silicone) [employer] Platinum-Silicone, Target, [employer]  Olive and Bee intimate cream-  www.oliveandbee.com.au  [place] Things to avoid in lubricants are glycerin, warming gels, tingling gels, icing or cooling  gels, and scented gels.  Also avoid Vaseline. KY jelly, Replens, and Astroglide kills good bacteria(lactobacilli)  Things to avoid in the vaginal area . Do not use things to irritate the vulvar area . No lotions- see  below . No soaps; can use Aveeno or Calendula cleanser if needed. Must be gentle . No deodorants . No douches . Good to sleep without underwear to let the vaginal area to air out . No scrubbing: spread the lips to let warm water rinse over labias and pat dry  Creams that can be used on the Vulva Area  V [employer], walmart  Vital V Wild Yam Salve  [name]- [employer] Botanical  Pro-Meno Wild Yam Cream  Coconut oil, olive oil  Cleo by [employer] labial moisturizer -Amazon,   Desert Olivet [name] ( lidocaine) or Desert [employer]

## 2019-10-17 NOTE — Therapy (Signed)
[facility] [address], [place], [place], [address] Phone: [phone]   Fax:  [phone]  Physical Therapy Evaluation  Patient Details  Name: Tina [name] MRN: [id number] Date of Birth: December 08, 1980 Referring Provider (PT): Dr. [name]   Encounter Date: 10/17/2019   PT End of Session - 10/17/19 1734    Visit Number 1    Date for PT Re-Evaluation 02/16/20    Authorization Type UMR    PT Start Time 9937    PT Stop Time 1696    PT Time Calculation (min) 45 min    Activity Tolerance Patient tolerated treatment well;No increased pain    Behavior During Therapy WFL for tasks assessed/performed           Past Medical History:  Diagnosis Date  . Angio-edema   . Hypothyroidism, Levothyroxine only 03/15/2017  . Mirena IUD (intrauterine device) in place 03/15/2017  . Urticaria     Past Surgical History:  Procedure Laterality Date  . NO PAST SURGERIES      There were no vitals filed for this visit.    Subjective Assessment - 10/17/19 1450    Subjective Patien had a baby vaginally on 06/03/2019. She had a second degree tear. The pain is better but still hurts. Pain with initial penetration.    Patient Stated Goals reduce the pain    Currently in Pain? Yes    Pain Score 5     Pain Location Vagina    Pain Orientation Mid    Pain Descriptors / Indicators Aching    Pain Type Acute pain    Pain Onset More than a month ago    Pain Frequency Intermittent    Aggravating Factors  initial penetration with intercourse, vaginal exam,    Pain Relieving Factors no vaginal penetration    Multiple Pain Sites No              [facility] PT Assessment - 10/17/19 0001      Assessment   Medical Diagnosis R102 Pelvic pain    Referring Provider (PT) Dr. [name]    Onset Date/Surgical Date 06/03/19    Prior Therapy none      Precautions   Precautions None      Restrictions   Weight  Bearing Restrictions No      Balance Screen   Has the patient fallen in the past 6 months No    Has the patient had a decrease in activity level because of a fear of falling?  No    Is the patient reluctant to leave their home because of a fear of falling?  No      Home Ecologist residence      Prior Function   Level of Independence Independent    Vocation Full time employment    Vocation Requirements OBgyn     Leisure run couple miles 1-2 times       Cognition   Overall Cognitive Status Within Functional Limits for tasks assessed      Posture/Postural Control   Posture/Postural Control No significant limitations      ROM / Strength   AROM / PROM / Strength AROM;PROM;Strength      PROM   Lumbar Flexion decreased by 25% with tightness at the L5-S1 area      Strength   Right Hip ABduction 4/5    Left Hip ABduction 4/5      Palpation   Spinal  mobility T4-T6 and L5 vertebrae decreased mobiity    SI assessment  ASIS are equal                      Objective measurements completed on examination: See above findings.     Pelvic Floor Special Questions - 10/17/19 0001    Prior Pregnancies Yes    Number of Pregnancies 1    Number of Vaginal Deliveries 1    Diastasis Recti 1.5 above umbilicus, 2 finger width below umbilicus with good tension    Currently Sexually Active Yes    Is this Painful Yes    Marinoff Scale pain interrupts completion    Urinary Leakage Yes    Activities that cause leaking With strong urge   better   Fecal incontinence Yes   managed constipation   Falling out feeling (prolapse) No    Pelvic Floor Internal Exam Patient confirms identification and approves PT to assess pelvic floor and treatment    Exam Type Vaginal    Palpation tenderness located on the psoterior vaginal canal, superior urethra area    Strength fair squeeze, definite lift            [facility] Adult PT Treatment/Exercise - 10/17/19 0001       Self-Care   Self-Care Other Self-Care Comments    Other Self-Care Comments  instruction on lubricants she can try during intercourse; instruction on perineal soft tissue work to work on the scar from her second degree tear                  PT Education - 10/17/19 1734    Education Details stretching the perineal body and information on lubricants    Person(s) Educated Patient    Methods Explanation;Demonstration;Verbal cues;Handout    Comprehension Verbalized understanding            PT Short Term Goals - 10/17/19 1737      PT SHORT TERM GOAL #1   Title independent with perineal massage and scar massage to improve tissue mobility    Time 4    Period Weeks    Status New      PT SHORT TERM GOAL #2   Title pain with intercourse decreased >/= 25% due to improved tissue mobitliy    Time 4    Period Weeks    Status New    Target Date 11/14/19             PT Long Term Goals - 10/17/19 1738      PT LONG TERM GOAL #1   Title independent with advanced HEP    Time 4    Period Months    Status New    Target Date 02/16/20      PT LONG TERM GOAL #2   Title able to have intercoures with minimal to no pain due to reduction of scar formation and improved mobility    Time 4    Period Months    Status New    Target Date 02/16/20      PT LONG TERM GOAL #3   Title able to have a vaginal exam with reduction of pain due to improve scar mobility and elongation    Time 4    Period Months    Status New    Target Date 02/16/20                  Plan - 10/17/19 1527    Clinical Impression  Statement Patient is a 39 year old female with pelvic pain since she had her daughter on 06/03/2019 vaginally. Patient had a second degree tear. Patient reports pain level at 5/10 with initial vaginal penetration and during a vaginal exam. She has tenderness located posterior introitus and superiorly on the vulvar area with redness. Her perineal body has decreased mobilty. Patient  has weakness in lumbar and T4-T8 area. Patient pelvic floor strength is 3/5. Patient reports urinary leakage with urge to void. Bilateral hip abduction strength is 4/5. Diastasis Recti is 2 fingers width with good tension below the umbilicus and 1.5 above umbilicus. Patient will benefit from skilled therapy to improve pelvic floor strength and coordination while improve mobility of the perineal body.    Personal Factors and Comorbidities Sex;Comorbidity 2    Comorbidities vagial birth with  a second degree tear    Examination-Participation Restrictions Interpersonal Relationship    Stability/Clinical Decision Making Evolving/Moderate complexity    Clinical Decision Making Low    Rehab Potential Excellent    PT Frequency 1x / week    PT Duration Other (comment)   4 months   PT Treatment/Interventions ADLs/Self Care Home Management;Cryotherapy;Moist Heat;Neuromuscular re-education;Therapeutic exercise;Therapeutic activities;Patient/family education;Manual techniques;Scar mobilization    PT Next Visit Plan work on perineal body mobility and scar; pelvic floor contraction with core strength    Consulted and Agree with Plan of Care Patient           Patient will benefit from skilled therapeutic intervention in order to improve the following deficits and impairments:  Decreased coordination, Increased fascial restricitons, Pain, Decreased strength, Decreased activity tolerance  Visit Diagnosis: Muscle weakness (generalized) - Plan: PT plan of care cert/re-cert  Cramp and spasm - Plan: PT plan of care cert/re-cert     Problem List Patient Active Problem List   Diagnosis Date Noted  . Pain aggravated by breast feeding 08/01/2019  . Advanced maternal age, primigravida 06/03/2019  . Infection in pregnancy, antepartum 01/03/2019  . Supervision of high risk pregnancy, antepartum 11/01/2018  . Advanced maternal age, primigravida, antepartum 11/01/2018  . Twin reversed-arterial perfusion (TRAP)  sequence pregnancy, antepartum 11/01/2018  . Paresthesias 04/01/2018  . Nevus of scalp 01/07/2018  . 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 Melasma 01/07/2018  . 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 Hypothyroid in pregnancy, antepartum 03/15/2017  . History of rectal bleeding 03/15/2017    [name], PT 10/17/19 5:42 PM   [facility] [address] [place], [place], [address] Phone: [phone]   Fax:  [phone]  Name: Tina [name] MRN: [id number] Date of Birth: December 20, 1980

## 2019-10-27 ENCOUNTER — Other Ambulatory Visit: Payer: Self-pay | Admitting: *Deleted

## 2019-10-27 DIAGNOSIS — E039 Hypothyroidism, unspecified: Secondary | ICD-10-CM

## 2019-10-27 NOTE — Progress Notes (Signed)
TSH drawn per Dr Sheppard Coil

## 2019-10-28 ENCOUNTER — Other Ambulatory Visit: Payer: Self-pay | Admitting: Osteopathic Medicine

## 2019-10-28 LAB — TSH: TSH: 0.22 mIU/L — ABNORMAL LOW

## 2019-10-31 ENCOUNTER — Encounter: Payer: Self-pay | Admitting: Osteopathic Medicine

## 2019-10-31 DIAGNOSIS — E039 Hypothyroidism, unspecified: Secondary | ICD-10-CM

## 2019-10-31 MED ORDER — LEVOTHYROXINE SODIUM 112 MCG PO TABS: 112.0000 ug | ORAL_TABLET | Freq: Every day | ORAL | 0 refills | Status: DC

## 2019-11-09 ENCOUNTER — Ambulatory Visit: Payer: 59 | Attending: Obstetrics & Gynecology | Admitting: Physical Therapy

## 2019-11-09 ENCOUNTER — Encounter: Payer: Self-pay | Admitting: Physical Therapy

## 2019-11-09 ENCOUNTER — Other Ambulatory Visit: Payer: Self-pay

## 2019-11-09 DIAGNOSIS — R252 Cramp and spasm: Secondary | ICD-10-CM | POA: Insufficient documentation

## 2019-11-09 DIAGNOSIS — M6281 Muscle weakness (generalized): Secondary | ICD-10-CM | POA: Insufficient documentation

## 2019-11-09 MED ORDER — LEVOTHYROXINE SODIUM 112 MCG PO TABS: 112.0000 ug | ORAL_TABLET | Freq: Every day | ORAL | 0 refills | Status: DC

## 2019-11-09 NOTE — Therapy (Signed)
[facility] [address] [place], [place], [address] Phone: [phone]   Fax:  [phone]  Physical Therapy Treatment  Patient Details  Name: Tina [name] MRN: [id number] Date of Birth: June 26, 1980 Referring Provider (PT): Dr. [name]   Encounter Date: 11/09/2019   PT End of Session - 11/09/19 1717    Visit Number 2    Date for PT Re-Evaluation 02/16/20    Authorization Type UMR    PT Start Time 1520    PT Stop Time 1705    PT Time Calculation (min) 105 min    Activity Tolerance Patient tolerated treatment well;No increased pain    Behavior During Therapy WFL for tasks assessed/performed           Past Medical History:  Diagnosis Date  . Angio-edema   . Hypothyroidism, Levothyroxine only 03/15/2017  . Mirena IUD (intrauterine device) in place 03/15/2017  . Urticaria     Past Surgical History:  Procedure Laterality Date  . NO PAST SURGERIES      There were no vitals filed for this visit.   Subjective Assessment - 11/09/19 1621    Subjective Things are coming along well. I feel like there is less pain with the stretches.    Patient Stated Goals reduce the pain    Currently in Pain? Yes    Pain Score 5     Pain Location Vagina    Pain Orientation Mid    Pain Descriptors / Indicators Aching    Pain Type Acute pain    Pain Onset More than a month ago    Pain Frequency Intermittent    Aggravating Factors  initial penetration with intercourse, vaginal exam    Pain Relieving Factors no vaginal penetration    Multiple Pain Sites No                          Pelvic Floor Special Questions - 11/09/19 0001    Pelvic Floor Internal Exam Patient confirms identification and approves PT to assess pelvic floor and treatment    Exam Type Vaginal    Strength fair squeeze, definite lift             [facility] Adult PT Treatment/Exercise - 11/09/19 0001      Lumbar  Exercises: Supine   Ab Set 10 reps;5 seconds    AB Set Limitations with tactile cues to engage the TA    Isometric Hip Flexion 10 reps;5 seconds    Other Supine Lumbar Exercises supine breathing into the lower rib cage to perform 360  breathing and done in sitting      Manual Therapy   Manual Therapy Soft tissue mobilization;Joint mobilization;Myofascial release;Internal Pelvic Floor    Joint Mobilization PA mobilizaiton to T8-T10    Soft tissue mobilization soft tissue work to the lumbar paraspinals and lateral trunk    Myofascial Release using a suction cup to the lumbar, lateral trunk and abdomen to release the fascia between skin and muscle    Internal Pelvic Floor the perineal body and posterior pelvic floor monitoring for pain                  PT Education - 11/09/19 1714    Education Details Access Code: [email or web address]    Person(s) Educated Patient    Methods Explanation;Demonstration;Verbal cues;Handout    Comprehension Returned demonstration;Verbalized understanding            PT  Short Term Goals - 10/17/19 1737      PT SHORT TERM GOAL #1   Title independent with perineal massage and scar massage to improve tissue mobility    Time 4    Period Weeks    Status New      PT SHORT TERM GOAL #2   Title pain with intercourse decreased >/= 25% due to improved tissue mobitliy    Time 4    Period Weeks    Status New    Target Date 11/14/19             PT Long Term Goals - 10/17/19 1738      PT LONG TERM GOAL #1   Title independent with advanced HEP    Time 4    Period Months    Status New    Target Date 02/16/20      PT LONG TERM GOAL #2   Title able to have intercoures with minimal to no pain due to reduction of scar formation and improved mobility    Time 4    Period Months    Status New    Target Date 02/16/20      PT LONG TERM GOAL #3   Title able to have a vaginal exam with reduction of pain due to improve scar mobility and elongation    Time 4     Period Months    Status New    Target Date 02/16/20                 Plan - 11/09/19 1717    Clinical Impression Statement Patient needs tactile cues to engage the lower abdomen. Patient has fascial tightness in the back, lateral trunk and abdomen. Patient was able to do 360 breathing after therapy. Patient has elongation of the pelvic floor muscles after internal work and able to contract the posterior pelvic floor better. Patient will benefit from skilled therapy to improve pelvic floor strength and coordination while improve mobility of the perineal body.    Personal Factors and Comorbidities Sex;Comorbidity 2    Comorbidities vagial birth with  a second degree tear    Examination-Participation Restrictions Interpersonal Relationship    Stability/Clinical Decision Making Evolving/Moderate complexity    Rehab Potential Excellent    PT Frequency 1x / week    PT Duration Other (comment)   4 months   PT Treatment/Interventions ADLs/Self Care Home Management;Cryotherapy;Moist Heat;Neuromuscular re-education;Therapeutic exercise;Therapeutic activities;Patient/family education;Manual techniques;Scar mobilization    PT Next Visit Plan work on advancing abdominal contraction, see about 360 breathing, perineal body mobilization, bulging the pelvic floor    PT Home Exercise Plan Access Code: [email or web address]    [email or web address] and Agree with Plan of Care Patient           Patient will benefit from skilled therapeutic intervention in order to improve the following deficits and impairments:  Decreased coordination, Increased fascial restricitons, Pain, Decreased strength, Decreased activity tolerance  Visit Diagnosis: Muscle weakness (generalized)  Cramp and spasm     Problem List Patient Active Problem List   Diagnosis Date Noted  . Pain aggravated by breast feeding 08/01/2019  . Advanced maternal age, primigravida 06/03/2019  . Infection in pregnancy, antepartum 01/03/2019  . Supervision of  high risk pregnancy, antepartum 11/01/2018  . Advanced maternal age, primigravida, antepartum 11/01/2018  . Twin reversed-arterial perfusion (TRAP) sequence pregnancy, antepartum 11/01/2018  . Paresthesias 04/01/2018  . Nevus of scalp 01/07/2018  . 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  Melasma 01/07/2018  . 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 Hypothyroid in pregnancy, antepartum 03/15/2017  . History of rectal bleeding 03/15/2017    [name], PT 11/09/19 5:20 PM   [facility] [address] [place], [place], [address] Phone: [phone]   Fax:  [phone]  Name: Koreen Lizaola MRN: [id number] Date of Birth: December 19, 1980

## 2019-11-09 NOTE — Patient Instructions (Signed)
Access Code: [email or web address] URL: [email or web address] Date: 11/09/2019 Prepared by: [name]  Exercises Supine Transversus Abdominis Bracing - Hands on Stomach - 1 x daily - 7 x weekly - 1 sets - 10 reps - 5 sec hold Hooklying Isometric Hip Flexion - 1 x daily - 7 x weekly - 1 sets - 10 reps - 5 sec hold [facility] [address], [place], [place] [address] Phone # [phone] Fax [phone]

## 2019-11-17 ENCOUNTER — Ambulatory Visit: Payer: 59 | Admitting: Physical Therapy

## 2019-11-21 ENCOUNTER — Other Ambulatory Visit: Payer: Self-pay

## 2019-11-21 ENCOUNTER — Ambulatory Visit: Payer: 59 | Attending: Obstetrics & Gynecology | Admitting: Physical Therapy

## 2019-11-21 ENCOUNTER — Encounter: Payer: Self-pay | Admitting: Physical Therapy

## 2019-11-21 DIAGNOSIS — M6281 Muscle weakness (generalized): Secondary | ICD-10-CM | POA: Insufficient documentation

## 2019-11-21 DIAGNOSIS — R252 Cramp and spasm: Secondary | ICD-10-CM | POA: Insufficient documentation

## 2019-11-21 NOTE — Therapy (Signed)
[facility] [address], [place], [place], [address] Phone: [phone]   Fax:  [phone]  Physical Therapy Treatment  Patient Details  Name: Tina [name] MRN: [id number] Date of Birth: [birth year] Referring Provider (PT): Dr. [name]   Encounter Date: 11/21/2019   PT End of Session - 11/21/19 1234    Visit Number 3    Date for PT Re-Evaluation 02/16/20    Authorization Type UMR    PT Start Time 9767    PT Stop Time 1225    PT Time Calculation (min) 40 min    Activity Tolerance Patient tolerated treatment well;No increased pain    Behavior During Therapy WFL for tasks assessed/performed           Past Medical History:  Diagnosis Date  . Angio-edema   . Hypothyroidism, Levothyroxine only 03/15/2017  . Mirena IUD (intrauterine device) in place 03/15/2017  . Urticaria     Past Surgical History:  Procedure Laterality Date  . NO PAST SURGERIES      There were no vitals filed for this visit.   Subjective Assessment - 11/21/19 1146    Subjective We had intercourse and had minmal discomfort.                             [place] Adult PT Treatment/Exercise - 11/21/19 0001      Self-Care   Self-Care Other Self-Care Comments    Other Self-Care Comments  educated patient on vaginal lubricants and vulvar care, educated patient on how to perform self perineal massage      Lumbar Exercises: Supine   Ab Set 10 reps;5 seconds    AB Set Limitations with tactile cues to engage the TA; using ball squeez    Isometric Hip Flexion 10 reps;5 seconds    Isometric Hip Flexion Limitations as she brings her leg down keeping the lower abdominals engaged    Other Supine Lumbar Exercises supine abdominal contraction with head lift and keeping the lower abdominals engaged      Knee/Hip Exercises: Standing   Forward Lunges Right;Left;3 sets;5 reps    Forward Lunges Limitations holding 5#  kettle bell on different sides, keeping hips leveled    Functional Squat 1 set;15 reps    Functional Squat Limitations will lose the stability at end range of the squat, keeping the abdominals engaged; holding 15# kettle bell in front the on side    Other Standing Knee Exercises hip hinging against the wall to keep spinal nuetral and flexing at the hips.     Other Standing Knee Exercises walking up and down stairs forward and sideways with keeping core engaged,                   PT Education - 11/21/19 1233    Education Details Access Code: [email or web address]; information on lubricants; ways to massage muscles for her coccyx pain    Person(s) Educated Patient    Methods Explanation;Demonstration;Verbal cues;Handout    Comprehension Returned demonstration;Verbalized understanding            PT Short Term Goals - 11/21/19 1250      PT SHORT TERM GOAL #1   Title independent with perineal massage and scar massage to improve tissue mobility    Time 4    Period Weeks    Status Achieved      PT SHORT TERM GOAL #2   Title pain with  intercourse decreased >/= 25% due to improved tissue mobitliy    Time 4    Period Weeks    Status Achieved             PT Long Term Goals - 10/17/19 1738      PT LONG TERM GOAL #1   Title independent with advanced HEP    Time 4    Period Months    Status New    Target Date 02/16/20      PT LONG TERM GOAL #2   Title able to have intercoures with minimal to no pain due to reduction of scar formation and improved mobility    Time 4    Period Months    Status New    Target Date 02/16/20      PT LONG TERM GOAL #3   Title able to have a vaginal exam with reduction of pain due to improve scar mobility and elongation    Time 4    Period Months    Status New    Target Date 02/16/20                 Plan - 11/21/19 1243    Clinical Impression Statement Patient has difficulty with engaging her abdominals in different positions. She will flex  in the thoracic spine when she is squatting. As she goes down the stairs she will list to the left side. Patient reports minimal to no pain with intercourse. Patient reports she has coccyx pain when sitting too long so she was educated on how to relieve the pain with massage. Patient understands ways to exericse while doing daily tasks. Patient will benefit from skilled therapy to improve pelvic floor strength and coordination while improve mobiity of the perineal body.    Personal Factors and Comorbidities Sex;Comorbidity 2    Comorbidities vagial birth with  a second degree tear    Examination-Participation Restrictions Interpersonal Relationship    Stability/Clinical Decision Making Evolving/Moderate complexity    Rehab Potential Excellent    PT Frequency 1x / week    PT Duration Other (comment)   4 months   PT Treatment/Interventions ADLs/Self Care Home Management;Cryotherapy;Moist Heat;Neuromuscular re-education;Therapeutic exercise;Therapeutic activities;Patient/family education;Manual techniques;Scar mobilization    PT Next Visit Plan see how intercourse is, check on core engagement, Angel wings in sitting, serratus up the wall    PT Home Exercise Plan Access Code: [email or web address]    Recommended Other Services MD signed initial evay    Consulted and Agree with Plan of Care Patient           Patient will benefit from skilled therapeutic intervention in order to improve the following deficits and impairments:  Decreased coordination, Increased fascial restricitons, Pain, Decreased strength, Decreased activity tolerance  Visit Diagnosis: Muscle weakness (generalized)  Cramp and spasm     Problem List Patient Active Problem List   Diagnosis Date Noted  . Pain aggravated by breast feeding 08/01/2019  . Advanced maternal age, primigravida 06/03/2019  . Infection in pregnancy, antepartum 01/03/2019  . Supervision of high risk pregnancy, antepartum 11/01/2018  . Advanced maternal age,  primigravida, antepartum 11/01/2018  . Twin reversed-arterial perfusion (TRAP) sequence pregnancy, antepartum 11/01/2018  . Paresthesias 04/01/2018  . Nevus of scalp 01/07/2018  . 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 Melasma 01/07/2018  . 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 Hypothyroid in pregnancy, antepartum 03/15/2017  . History of rectal bleeding 03/15/2017    [name], PT 11/21/19 12:51 PM  [facility] [address] [place], [place], [address] Phone: [phone]   Fax:  [phone]  Name: Tina [name] MRN: [id number] Date of Birth: 08-05-1980

## 2019-11-21 NOTE — Patient Instructions (Addendum)
Lubrication . Used for intercourse to reduce friction . Avoid ones that have glycerin, warming gels, tingling gels, icing or cooling gel, scented . Avoid parabens due to a preservative similar to female sex hormone . May need to be reapplied once or several times during sexual activity . Can be applied to both partners genitals prior to vaginal penetration to minimize friction or irritation . Prevent irritation and mucosal tears that cause post coital pain and increased the risk of vaginal and urinary tract infections . Oil-based lubricants cannot be used with condoms due to breaking them down.  Least likely to irritate vaginal tissue.  . Plant based-lubes are safe . Silicone-based lubrication are thicker and last long and used for post-menopausal women  Vaginal Lubricators Here is a list of some suggested lubricators you can use for intercourse. Use the most hypoallergenic product.  You can place on you or your partner.   Tina [name] ( water based)  Tina [name] ( good  if frequent UTI's)- [employer], amazon  Tina [name]-(aloe and silicone based )  Tina [name] (www.blossom-organics.com)- (aloe based )  Coconut oil, Tina oil -not good with condoms   Tina [name]- (water based) amazon  Tina [name]- ( silicon) Tina [name] has an organic one  Tina [name]- (water based and has plant oil based similar to silicone) Tina [name], Tina [name], Tina [name]  Tina [name]-  www.oliveandbee.com.au  Tina [name] to avoid in lubricants are glycerin, warming gels, tingling gels, icing or cooling  gels, and scented gels.  Also avoid Vaseline. Tina [name], Tina [name], and Tina [name] kills good bacteria(lactobacilli)  [name] to avoid in the vaginal area . Do not use [name] to irritate the vulvar area . No lotions- see below . No soaps; can use Tina [name] or Tina [name]  cleanser if needed. Must be gentle . No deodorants . No douches . Good to sleep without underwear to let the vaginal area to air out . No scrubbing: spread the lips to let warm water rinse over labias and pat dry  Creams that can be used on the Vulva Area  Tina [name] Tina [name], [employer]  Tina [name]  [name]- [employer] Botanical Pro-Meno Wild Yam [name]  Coconut oil, Tina oil  Cleo by [employer] labial moisturizer -Amazon,   Desert [name] [name] ( lidocaine) or [name] with daily life 1. Going up and down stairs with keeping hips leveled, carry Tina [name] on one side and in middle    As you go down steps keep hips leveled and go slow    Go up and down steps sideways  2. Squat and hinge at hip keeping chest up    Be careful of tucking tailbone down  3. Lunge forward to pick up Tina [name], and items on floor, keep hip hips leveled  [facility] [address], [place],  [address] Phone # [phone] Fax [phone]  Access Code: [email or web address] URL: [email or web address] Date: 11/21/2019 Prepared by: Tina [name]  Exercises Supine Transversus Abdominis Bracing - Hands on Stomach - 1 x daily - 7 x weekly - 1 sets - 10 reps - 5 sec hold Hooklying Isometric Hip Flexion - 1 x daily - 7 x weekly - 1 sets - 10 reps - 5 sec hold Piriformis Mobilization with Small Ball - 1 x daily - 7 x weekly - 3 sets - 10  reps Supine Pelvic Floor Stretch - 1 x daily - 7 x weekly - 1 sets - 2 reps - 30 sec hold

## 2019-12-08 ENCOUNTER — Encounter: Payer: Self-pay | Admitting: Physical Therapy

## 2019-12-08 ENCOUNTER — Other Ambulatory Visit: Payer: Self-pay

## 2019-12-08 ENCOUNTER — Ambulatory Visit: Payer: 59 | Admitting: Physical Therapy

## 2019-12-08 DIAGNOSIS — R252 Cramp and spasm: Secondary | ICD-10-CM

## 2019-12-08 DIAGNOSIS — M6281 Muscle weakness (generalized): Secondary | ICD-10-CM | POA: Diagnosis not present

## 2019-12-08 NOTE — Therapy (Signed)
[facility] [address], [place], [place], [address] Phone: [phone]   Fax:  [phone]  Physical Therapy Treatment  Patient Details  Name: Tina [name] MRN: [id number] Date of Birth: 1980/06/22 Referring Provider (PT): Dr. [name]   Encounter Date: 12/08/2019   PT End of Session - 12/08/19 1313    Visit Number 4    Date for PT Re-Evaluation 02/16/20    Authorization Type UMR    PT Start Time 1230    PT Stop Time 1310    PT Time Calculation (min) 40 min    Activity Tolerance Patient tolerated treatment well;No increased pain    Behavior During Therapy WFL for tasks assessed/performed           Past Medical History:  Diagnosis Date  . Angio-edema   . Hypothyroidism, Levothyroxine only 03/15/2017  . Mirena IUD (intrauterine device) in place 03/15/2017  . Urticaria     Past Surgical History:  Procedure Laterality Date  . NO PAST SURGERIES      There were no vitals filed for this visit.   Subjective Assessment - 12/08/19 1229    Subjective I feel the exercises have been helping. No pain with intercourse.    Patient Stated Goals reduce the pain    Currently in Pain? Yes    Pain Score 5     Pain Location Coccyx    Pain Orientation Mid    Aggravating Factors  sitting then going into sitting    Pain Relieving Factors stand up              [facility] PT Assessment - 12/08/19 0001      PROM   Lumbar Flexion decreased by 25% with tightness at the L5-S1 area    Lumbar Extension decreased by 25%                         [facility] Adult PT Treatment/Exercise - 12/08/19 0001      Knee/Hip Exercises: Standing   Other Standing Knee Exercises sit to stand with therapist anterior to the coccyx bone to elongate the levator ani    Other Standing Knee Exercises standing with hip hinge and hips in IR to elongate the hip ER and forearms on the high mat      Manual Therapy    Manual Therapy Myofascial release;Soft tissue mobilization    Joint Mobilization PA and rotational mobilization to T7-L5, lower rib mobilization, side glide to T11-L3    Soft tissue mobilization lumbar paraspinals, quadratus, gluteus, along the iliac ridge, coccygeus, piriformis    Myofascial Release using the suction cup on the lumbar region to improve fascial tightness, myofascial release to the lumbar with one hand on the sacrum and thoracic                  PT Education - 12/08/19 1313    Education Details standing hip hinge with hips IR    Person(s) Educated Patient    Methods Explanation;Demonstration    Comprehension Verbalized understanding;Returned demonstration            PT Short Term Goals - 12/08/19 1233      PT SHORT TERM GOAL #1   Title independent with perineal massage and scar massage to improve tissue mobility    Time 4    Period Weeks    Status Achieved      PT SHORT TERM GOAL #2   Title  pain with intercourse decreased >/= 25% due to improved tissue mobitliy    Time 4    Period Weeks    Status Achieved             PT Long Term Goals - 12/08/19 1233      PT LONG TERM GOAL #1   Title independent with advanced HEP    Time 4    Period Months    Status On-going      PT LONG TERM GOAL #2   Title able to have intercoures with minimal to no pain due to reduction of scar formation and improved mobility    Time 4    Period Months    Status On-going      PT LONG TERM GOAL #3   Title able to have a vaginal exam with reduction of pain due to improve scar mobility and elongation    Time 4    Period Months    Status On-going                 Plan - 12/08/19 1314    Clinical Impression Statement Patient reports no pain with intercourse. She has coccyx pain with sit to stand. Patient has decreased lumbar flexion and extension due to tightness in the lumbar spine and multifidi. Patient is doing well with her core strength. Patient will benefit  from skilled therapy to improve pelvic floor strength and coordination while improving mobility.    Personal Factors and Comorbidities Sex;Comorbidity 2    Comorbidities vagial birth with  a second degree tear    Examination-Participation Restrictions Interpersonal Relationship    Stability/Clinical Decision Making Evolving/Moderate complexity    Rehab Potential Excellent    PT Frequency 1x / week    PT Duration Other (comment)   4 months   PT Treatment/Interventions ADLs/Self Care Home Management;Cryotherapy;Moist Heat;Neuromuscular re-education;Therapeutic exercise;Therapeutic activities;Patient/family education;Manual techniques;Scar mobilization    PT Next Visit Plan angel wings in sitting, serratus up the wall, one legged stance exercise    PT Home Exercise Plan Access Code: [email or web address]    Consulted and Agree with Plan of Care Patient           Patient will benefit from skilled therapeutic intervention in order to improve the following deficits and impairments:  Decreased coordination, Increased fascial restricitons, Pain, Decreased strength, Decreased activity tolerance  Visit Diagnosis: Muscle weakness (generalized)  Cramp and spasm     Problem List Patient Active Problem List   Diagnosis Date Noted  . Pain aggravated by breast feeding 08/01/2019  . Advanced maternal age, primigravida 06/03/2019  . Infection in pregnancy, antepartum 01/03/2019  . Supervision of high risk pregnancy, antepartum 11/01/2018  . Advanced maternal age, primigravida, antepartum 11/01/2018  . Twin reversed-arterial perfusion (TRAP) sequence pregnancy, antepartum 11/01/2018  . Paresthesias 04/01/2018  . Nevus of scalp 01/07/2018  . 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 Melasma 01/07/2018  . 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 Hypothyroid in pregnancy, antepartum 03/15/2017  . History of rectal bleeding 03/15/2017    Tina [name], PT 12/08/19 1:18 PM   [facility] [address], [place], [place], [address] Phone: [phone]   Fax:  [phone]  Name: Tina [name] MRN: [id number] Date of Birth: 24-Jul-1980

## 2020-01-02 ENCOUNTER — Ambulatory Visit: Payer: 59 | Admitting: Physical Therapy

## 2020-01-11 ENCOUNTER — Encounter: Payer: 59 | Admitting: Physical Therapy

## 2020-01-12 ENCOUNTER — Encounter: Payer: 59 | Admitting: Physical Therapy

## 2020-01-12 ENCOUNTER — Other Ambulatory Visit: Payer: Self-pay

## 2020-01-12 ENCOUNTER — Encounter: Payer: Self-pay | Admitting: Physical Therapy

## 2020-01-12 ENCOUNTER — Ambulatory Visit: Payer: 59 | Attending: Obstetrics & Gynecology | Admitting: Physical Therapy

## 2020-01-12 DIAGNOSIS — R252 Cramp and spasm: Secondary | ICD-10-CM | POA: Diagnosis not present

## 2020-01-12 DIAGNOSIS — M6281 Muscle weakness (generalized): Secondary | ICD-10-CM | POA: Insufficient documentation

## 2020-01-12 NOTE — Patient Instructions (Addendum)
Return to Running Criteria (from [place], 2019)  Ability to perform 20-30 repetitions of each of the following without fatigue: Single calf raise (can start with fingertips on wall as needed for support) Single leg bridge Single leg sit to stand Sidelying hip abduction (can start with fingertips on wall as needed for support)   FOR INDIVIDUALS WITH PELVIC FLOOR SYMPTOMS: Ability to perform without leaking or prolapse symptoms: Walk 30 minutes Single leg balance 30 seconds Single leg squat 10 repetitions (2-3 sets) Jog in place 1 minute Forward bounding 10 repetitions Single leg hop 10 repetitions Single leg runners 10 repetitions   Addaday massage gun look at website or target website  [facility] [address], [place], [place] [address] Phone # [phone] Fax [phone]  Access Code: [email or web address] URL: [email or web address] Date: 01/12/2020 Prepared by: [name]  Exercises Supine Transversus Abdominis Bracing - Hands on Stomach - 1 x daily - 7 x weekly - 1 sets - 10 reps - 5 sec hold Hooklying Isometric Hip Flexion - 1 x daily - 7 x weekly - 1 sets - 10 reps - 5 sec hold Piriformis Mobilization with Small Ball - 1 x daily - 7 x weekly - 3 sets - 10 reps Supine Pelvic Floor Stretch - 1 x daily - 7 x weekly - 1 sets - 2 reps - 30 sec hold Sidelying Hip Abduction - 1 x daily - 7 x weekly - 3 sets - 10 reps Single Leg Bridge - 1 x daily - 7 x weekly - 3 sets - 10 reps Standing Single Leg Heel Raise - 1 x daily - 7 x weekly - 3 sets - 10 reps Single Leg Squat with Chair Touch - 1 x daily - 7 x weekly - 3 sets - 10 reps Skip with High Knees - 1 x daily - 7 x weekly - 3 sets - 10 reps

## 2020-01-12 NOTE — Therapy (Signed)
[facility] [address], [place], [place], [address] Phone: [phone]   Fax:  [phone]  Physical Therapy Treatment  Patient Details  Name: Tina [name] MRN: [id number] Date of Birth: 03/28/1981 Referring Provider (PT): Dr. [name]   Encounter Date: 01/12/2020   PT End of Session - 01/12/20 1444    Visit Number 5    Date for PT Re-Evaluation 02/16/20    Authorization Type UMR    PT Start Time 1400    PT Stop Time 1440    PT Time Calculation (min) 40 min    Activity Tolerance Patient tolerated treatment well;No increased pain    Behavior During Therapy WFL for tasks assessed/performed           Past Medical History:  Diagnosis Date   Angio-edema    Hypothyroidism, Levothyroxine only 03/15/2017   Mirena IUD (intrauterine device) in place 03/15/2017   Urticaria     Past Surgical History:  Procedure Laterality Date   NO PAST SURGERIES      There were no vitals filed for this visit.   Subjective Assessment - 01/12/20 1405    Subjective I have been feeling as good. Able to be intimate with no pain. I am using the jogger stroller. Tightening in the pelvic floor with the coccyx pain.    Patient Stated Goals reduce the pain    Currently in Pain? Yes    Pain Score 4     Pain Location Coccyx    Pain Orientation Mid    Pain Descriptors / Indicators Aching    Pain Type Acute pain    Pain Onset More than a month ago    Pain Frequency Intermittent    Aggravating Factors  sitting then going into sitting    Pain Relieving Factors stand up              [facility] PT Assessment - 01/12/20 0001      Assessment   Medical Diagnosis R102 Pelvic pain    Referring Provider (PT) Dr. [name]    Onset Date/Surgical Date 06/03/19      Posture/Postural Control   Posture/Postural Control No significant limitations      ROM / Strength   AROM / PROM / Strength  AROM;PROM;Strength                         [facility] Adult PT Treatment/Exercise - 01/12/20 0001      Lumbar Exercises: Supine   Single Leg Bridge Compliant    Bridge with Ball Squeeze Limitations 13 on right , 20x on left      Knee/Hip Exercises: Standing   Heel Raises Right;Left;Other (comment)    Heel Raises Limitations 30x    Other Standing Knee Exercises single leg squat from sit to stand on high mat 10x each    Other Standing Knee Exercises skip high knee      Knee/Hip Exercises: Sidelying   Hip ABduction Right;Left;Strengthening    Hip ABduction Limitations bil. 16      Manual Therapy   Manual Therapy Soft tissue mobilization;Joint mobilization    Joint Mobilization PA and rotational mobilization L3-S1; mobilize the sacrum    Soft tissue mobilization using the addaday to the left coccygeus                  PT Education - 01/12/20 1441    Education Details Access Code: [email or web address]; instruction on exercises to  return to running    Person(s) Educated Patient    Methods Explanation;Demonstration;Verbal cues;Handout    Comprehension Returned demonstration;Verbalized understanding            PT Short Term Goals - 12/08/19 1233      PT SHORT TERM GOAL #1   Title independent with perineal massage and scar massage to improve tissue mobility    Time 4    Period Weeks    Status Achieved      PT SHORT TERM GOAL #2   Title pain with intercourse decreased >/= 25% due to improved tissue mobitliy    Time 4    Period Weeks    Status Achieved             PT Long Term Goals - 01/12/20 1447      PT LONG TERM GOAL #1   Title independent with advanced HEP    Time 4    Period Months    Status On-going      PT LONG TERM GOAL #2   Title able to have intercoures with minimal to no pain due to reduction of scar formation and improved mobility    Time 4    Period Months    Status Achieved      PT LONG TERM GOAL #3   Title able to have a vaginal exam  with reduction of pain due to improve scar mobility and elongation    Time 4    Period Months    Status On-going                 Plan - 01/12/20 1444    Clinical Impression Statement Patient is not having pain with intercourse but the vaginal area felt tight. Patient coccyx pain was gone for several weeks but last week it started again with sit to stand. Patient is returning to run walk with a stroller. Today we went over exercises for her to return to running and how many she is to do. Patient had no pain after therapy and pelvis was in alignment. Patient will benefit from skilled therapy to improve pelvic floor strength and coordination while improving mobility.    Personal Factors and Comorbidities Sex;Comorbidity 2    Comorbidities vagial birth with  a second degree tear    Examination-Participation Restrictions Interpersonal Relationship    Stability/Clinical Decision Making Evolving/Moderate complexity    Rehab Potential Excellent    PT Frequency 1x / week    PT Duration Other (comment)   4 months   PT Treatment/Interventions ADLs/Self Care Home Management;Cryotherapy;Moist Heat;Neuromuscular re-education;Therapeutic exercise;Therapeutic activities;Patient/family education;Manual techniques;Scar mobilization    PT Next Visit Plan see about running, advanced HEP, see about coccyx pain    PT Home Exercise Plan Access Code: [email or web address]    Consulted and Agree with Plan of Care Patient           Patient will benefit from skilled therapeutic intervention in order to improve the following deficits and impairments:  Decreased coordination, Increased fascial restricitons, Pain, Decreased strength, Decreased activity tolerance  Visit Diagnosis: Muscle weakness (generalized)  Cramp and spasm     Problem List Patient Active Problem List   Diagnosis Date Noted   Pain aggravated by breast feeding 08/01/2019   Advanced maternal age, primigravida 06/03/2019   Infection in  pregnancy, antepartum 01/03/2019   Supervision of high risk pregnancy, antepartum 11/01/2018   Advanced maternal age, primigravida, antepartum 11/01/2018   Twin reversed-arterial perfusion (TRAP) sequence pregnancy, antepartum 11/01/2018  Paresthesias 04/01/2018   Nevus of scalp 01/07/2018   Neoplasm of uncertain behavior of skin 01/07/2018   Melasma 01/07/2018   Nevus spilus 01/07/2018   Hypothyroid in pregnancy, antepartum 03/15/2017   History of rectal bleeding 03/15/2017    [name], PT 01/12/20 2:48 PM   [facility] [address], [place], [place], [address] Phone: [phone]   Fax:  [phone]  Name: Tina [name] MRN: [id number] Date of Birth: 11-21-1980

## 2020-01-18 ENCOUNTER — Ambulatory Visit: Payer: 59 | Admitting: Physical Therapy

## 2020-01-19 ENCOUNTER — Other Ambulatory Visit: Payer: Self-pay

## 2020-01-19 ENCOUNTER — Ambulatory Visit (INDEPENDENT_AMBULATORY_CARE_PROVIDER_SITE_OTHER): Payer: 59

## 2020-01-19 DIAGNOSIS — Z23 Encounter for immunization: Secondary | ICD-10-CM | POA: Diagnosis not present

## 2020-01-19 NOTE — Progress Notes (Signed)
Pt here for flu shot. Injection given in right deltoid and tolerated well.

## 2020-02-10 DIAGNOSIS — D2372 Other benign neoplasm of skin of left lower limb, including hip: Secondary | ICD-10-CM | POA: Diagnosis not present

## 2020-02-10 DIAGNOSIS — L811 Chloasma: Secondary | ICD-10-CM | POA: Diagnosis not present

## 2020-02-10 DIAGNOSIS — Z808 Family history of malignant neoplasm of other organs or systems: Secondary | ICD-10-CM | POA: Diagnosis not present

## 2020-02-10 DIAGNOSIS — D1801 Hemangioma of skin and subcutaneous tissue: Secondary | ICD-10-CM | POA: Diagnosis not present

## 2020-02-10 DIAGNOSIS — D229 Melanocytic nevi, unspecified: Secondary | ICD-10-CM | POA: Diagnosis not present

## 2020-03-09 ENCOUNTER — Encounter: Payer: Self-pay | Admitting: Physical Therapy

## 2020-03-09 ENCOUNTER — Ambulatory Visit: Payer: 59 | Attending: Obstetrics & Gynecology | Admitting: Physical Therapy

## 2020-03-09 ENCOUNTER — Other Ambulatory Visit: Payer: Self-pay

## 2020-03-09 DIAGNOSIS — R252 Cramp and spasm: Secondary | ICD-10-CM | POA: Diagnosis not present

## 2020-03-09 DIAGNOSIS — M6281 Muscle weakness (generalized): Secondary | ICD-10-CM | POA: Diagnosis not present

## 2020-03-09 NOTE — Therapy (Addendum)
[facility] [address], [place], [place], [address] Phone: [phone]   Fax:  [phone]  Physical Therapy Treatment  Patient Details  Name: Tina [name] MRN: [id number] Date of Birth: Aug 25, 1980 Referring Provider (PT): Dr. [name]   Encounter Date: 03/09/2020   PT End of Session - 03/09/20 0850    Visit Number 6    Date for PT Re-Evaluation 06/01/20    Authorization Type UMR    PT Start Time 0845    PT Stop Time 0925    PT Time Calculation (min) 40 min    Activity Tolerance Patient tolerated treatment well;No increased pain    Behavior During Therapy WFL for tasks assessed/performed           Past Medical History:  Diagnosis Date  . Angio-edema   . Hypothyroidism, Levothyroxine only 03/15/2017  . Mirena IUD (intrauterine device) in place 03/15/2017  . Urticaria     Past Surgical History:  Procedure Laterality Date  . NO PAST SURGERIES      There were no vitals filed for this visit.   Subjective Assessment - 03/09/20 0848    Subjective I am still have the pelvic floor pain that when I stand it hurts. I am using the addaday massager.    Patient Stated Goals reduce the pain    Currently in Pain? Yes    Pain Score 5     Pain Location Coccyx    Pain Orientation Mid    Pain Descriptors / Indicators Aching    Pain Type Acute pain    Pain Onset More than a month ago    Aggravating Factors  sitting then going into standing    Pain Relieving Factors stand up    Multiple Pain Sites No              [facility] PT Assessment - 03/09/20 0001      Assessment   Medical Diagnosis R102 Pelvic pain    Referring Provider (PT) Dr. [name]    Onset Date/Surgical Date 06/03/19      Precautions   Precautions None      [place] residence      Prior Function   Level of Independence Independent      Cognition   Overall Cognitive  Status Within Functional Limits for tasks assessed      PROM   Lumbar Flexion full but goes to the left    Lumbar Extension decreased by 25%      Strength   Right Hip ABduction 4+/5    Left Hip ABduction 4/5      Palpation   SI assessment  left ASIS post. rotated                      Pelvic Floor Special Questions - 03/09/20 0001    Diastasis Recti 0.5 below umbilicus    Urinary Leakage No    Urinary urgency No    Pelvic Floor Internal Exam Patient confirms identification and approves PT to assess pelvic floor and treatment    Exam Type Vaginal    Strength fair squeeze, definite lift             [facility] Adult PT Treatment/Exercise - 03/09/20 0001      Manual Therapy   Manual Therapy Joint mobilization;Internal Pelvic Floor    Joint Mobilization PA and rotational mobilization L3-S1; mobilize the sacrum; distraction of the sacrum  Soft tissue mobilization to the perineal body and external anal sphincter with one finger internally and other externall anal sphincter    Internal Pelvic Floor left sidely soft tissue work to bil. coccygeus, sacrospinous ligament,                   PT Education - 03/09/20 [phone]    Education Details expained to patient on how her husband can do internal work to the coccygeus and outside around the ischial tuberosity    Person(s) Educated Patient    Methods Explanation;Demonstration    Comprehension Verbalized understanding            PT Short Term Goals - 12/08/19 1233      PT SHORT TERM GOAL #1   Title independent with perineal massage and scar massage to improve tissue mobility    Time 4    Period Weeks    Status Achieved      PT SHORT TERM GOAL #2   Title pain with intercourse decreased >/= 25% due to improved tissue mobitliy    Time 4    Period Weeks    Status Achieved             PT Long Term Goals - 03/09/20 0957      PT LONG TERM GOAL #1   Title independent with advanced HEP    Time 4    Period  Months    Status On-going      PT LONG TERM GOAL #2   Title able to have intercoures with minimal to no pain due to reduction of scar formation and improved mobility    Time 4    Period Months    Status Achieved      PT LONG TERM GOAL #3   Title able to have a vaginal exam with reduction of pain due to improve scar mobility and elongation    Baseline still has some tenderness in the levator ani    Time 4    Period Months    Status On-going      PT LONG TERM GOAL #4   Title pain with sitting then to standing is minimal to none to improve quality of life    Time 12    Period Weeks    Status New    Target Date 06/01/20                 Plan - 03/09/20 0942    Clinical Impression Statement Patient is not having pain with intercourse. She is not having urinary leakage. Bilateral hip abuction is weak. Lumbar extension is limited by 25% and lumbar flexion will go to the left. Patient continues to have pain when she is in sitting then goes to sit up. She has trigger points in the coccygeus, ischiococcygeus, and sacrospinous ligament. Her sacrum and coccyx had decreased movement. Patient will benefit from skilled therapy to reduce pain in coccyx area with sit to stand.    Personal Factors and Comorbidities Sex;Comorbidity 2    Comorbidities vagial birth with  a second degree tear    Examination-Participation Restrictions Interpersonal Relationship    Stability/Clinical Decision Making Evolving/Moderate complexity    Rehab Potential Excellent    PT Frequency 1x / week    PT Duration 12 weeks    PT Treatment/Interventions ADLs/Self Care Home Management;Cryotherapy;Moist Heat;Neuromuscular re-education;Therapeutic exercise;Therapeutic activities;Patient/family education;Manual techniques;Scar mobilization    PT Next Visit Plan see about running, advanced HEP, see about coccyx pain; see about sacral  mobility    PT Home Exercise Plan Access Code: [email or web address]    Recommended Other Services  sent MD renewal    Consulted and Agree with Plan of Care Patient           Patient will benefit from skilled therapeutic intervention in order to improve the following deficits and impairments:  Decreased coordination, Increased fascial restricitons, Pain, Decreased strength, Decreased activity tolerance  Visit Diagnosis: Muscle weakness (generalized) - Plan: PT plan of care cert/re-cert  Cramp and spasm - Plan: PT plan of care cert/re-cert     Problem List Patient Active Problem List   Diagnosis Date Noted  . Pain aggravated by breast feeding 08/01/2019  . Advanced maternal age, primigravida 06/03/2019  . Infection in pregnancy, antepartum 01/03/2019  . Supervision of high risk pregnancy, antepartum 11/01/2018  . Advanced maternal age, primigravida, antepartum 11/01/2018  . Twin reversed-arterial perfusion (TRAP) sequence pregnancy, antepartum 11/01/2018  . Paresthesias 04/01/2018  . Nevus of scalp 01/07/2018  . 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 Melasma 01/07/2018  . 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 Hypothyroid in pregnancy, antepartum 03/15/2017  . History of rectal bleeding 03/15/2017    [name], PT 03/09/20 10:14 AM   [facility] [address] [place], [place], [address] Phone: [phone]   Fax:  [phone]  Name: Tina [name] MRN: [id number] Date of Birth: 06-08-1980  PHYSICAL THERAPY DISCHARGE SUMMARY  Visits from Start of Care: 6  Current functional level related to goals / functional outcomes: See above. Patient has not returned since last visit.    Remaining deficits: See above.    Education / Equipment: HEP Plan:                                                    Patient goals were not met. Patient is being discharged due to not returning since the last visit.  Thank you for the referral. [name], PT 05/31/20 12:18 PM  ?????

## 2020-04-13 DIAGNOSIS — U071 COVID-19: Secondary | ICD-10-CM | POA: Diagnosis not present

## 2020-04-21 NOTE — L&D Delivery Note (Signed)
Patient complete and pushing. SVD of viable female infant over intact perineum. Infant delivered to mom's abdomen in usual fashion. Delayed cord clamping x 2 minute. Cord clamped x 2, cut. Spontaneous cry heard. Weight and Apgars pending. Cord blood obtained. Placenta delivered spontaneously and intact. LUS cleared of clot Vagina inspected.Shallow 2nd degree lacerations noted. Repaired with Vicryl Rapide EBL: 150cc Anesthesia: epidural

## 2020-04-23 ENCOUNTER — Other Ambulatory Visit: Payer: Self-pay

## 2020-04-23 ENCOUNTER — Ambulatory Visit (INDEPENDENT_AMBULATORY_CARE_PROVIDER_SITE_OTHER): Payer: 59 | Admitting: Obstetrics & Gynecology

## 2020-04-23 ENCOUNTER — Encounter: Payer: Self-pay | Admitting: Obstetrics & Gynecology

## 2020-04-23 VITALS — BP 112/67 | HR 76 | Wt 203.0 lb

## 2020-04-23 DIAGNOSIS — Z3201 Encounter for pregnancy test, result positive: Secondary | ICD-10-CM | POA: Diagnosis not present

## 2020-04-23 DIAGNOSIS — O3680X1 Pregnancy with inconclusive fetal viability, fetus 1: Secondary | ICD-10-CM

## 2020-04-23 NOTE — Progress Notes (Signed)
Bedside ultrasound performed and gestational sac measuring 5.5 weeks (0.92cm ). No fetal pole identified. Patient did say she had some light brown discharge.  Armandina Stammer RN

## 2020-04-23 NOTE — Progress Notes (Signed)
History:  40 y.o. G2P1001 here today for eval of pos preg test. LMP 03/03/2020. Pt denies sx of N/V. She has had fatigue but, she has recently has COVID and attributes the fatigue to this. She has not had breast tenderness. She is still nursing.  She does report passage of a small amount of brownish discharge on yesterday.   The following portions of the patient's history were reviewed and updated as appropriate: allergies, current medications, past family history, past medical history, past social history, past surgical history and problem list.  Review of Systems:  Pertinent items are noted in HPI.    Objective:  Physical Exam Blood pressure 112/67, pulse 76, weight 203 lb (92.1 kg), last menstrual period 03/03/2020, not currently breastfeeding.  CONSTITUTIONAL: Well-developed, well-nourished female in no acute distress.  HENT:  Normocephalic, atraumatic EYES: Conjunctivae and EOM are normal. No scleral icterus.  NECK: Normal range of motion SKIN: Skin is warm and dry. No rash noted. Not diaphoretic.No pallor. NEUROLGIC: Alert and oriented to person, place, and time. Normal coordination.  Pelvic: Normal appearing external genitalia   Labs and Imaging 04/23/2020 US done by  RN and repeated by me. GS sac noted with no yolk sac and no fetal pole. Gestational sac measuring 5.5 weeks (0.92cm )   Assessment & Plan:  Pos preg test. Suspect blighted ovum  [facility] today and repeat in 48 hours.   I have reviewed my clinical impression and presumed plan of action. Will await confirmation of labs.   Shelvia Fojtik L. Harraway-Smith, M.D., [place]n Core

## 2020-04-24 LAB — BETA HCG QUANT (REF LAB): hCG Quant: 7652 m[IU]/mL

## 2020-04-24 LAB — SPECIMEN STATUS REPORT

## 2020-04-25 ENCOUNTER — Other Ambulatory Visit (INDEPENDENT_AMBULATORY_CARE_PROVIDER_SITE_OTHER): Payer: 59

## 2020-04-25 ENCOUNTER — Other Ambulatory Visit: Payer: Self-pay

## 2020-04-25 DIAGNOSIS — O020 Blighted ovum and nonhydatidiform mole: Secondary | ICD-10-CM

## 2020-04-25 DIAGNOSIS — Z3201 Encounter for pregnancy test, result positive: Secondary | ICD-10-CM | POA: Diagnosis not present

## 2020-04-25 NOTE — Progress Notes (Signed)
Patient sent to lab for repeat HCG. Seletha Zimmermann RN  

## 2020-04-26 ENCOUNTER — Other Ambulatory Visit: Payer: Self-pay | Admitting: Osteopathic Medicine

## 2020-04-26 ENCOUNTER — Telehealth: Payer: Self-pay | Admitting: Obstetrics & Gynecology

## 2020-04-26 LAB — BETA HCG QUANT (REF LAB): hCG Quant: 11165 m[IU]/mL

## 2020-04-26 MED ORDER — MISOPROSTOL 200 MCG PO TABS: ORAL_TABLET | ORAL | 1 refills | Status: DC

## 2020-04-26 NOTE — Telephone Encounter (Signed)
TC to pt to review her [facility] results. Results support a blighted ovum. I have discussed this with her the treatment options. Pt will use Cytotec q 8 hours x 2.  I reviewed the common s/sx. All questions were answered.   Chanice Brenton L. Harraway-Smith, M.D., [place]n Core

## 2020-04-26 NOTE — Addendum Note (Signed)
Addended by: Willodean Rosenthal on: 04/26/2020 02:10 PM   Modules accepted: Orders

## 2020-05-01 ENCOUNTER — Encounter: Payer: Self-pay | Admitting: Osteopathic Medicine

## 2020-05-01 DIAGNOSIS — E039 Hypothyroidism, unspecified: Secondary | ICD-10-CM

## 2020-05-01 DIAGNOSIS — O99280 Endocrine, nutritional and metabolic diseases complicating pregnancy, unspecified trimester: Secondary | ICD-10-CM

## 2020-05-09 ENCOUNTER — Telehealth: Payer: Self-pay | Admitting: Physical Therapy

## 2020-05-09 ENCOUNTER — Encounter: Payer: 59 | Admitting: Physical Therapy

## 2020-05-09 NOTE — Telephone Encounter (Signed)
Called patient to see if she is to continue therapy or be discharged. If she is to continue then she is to make another appointment.  [name], PT @1 /19/2022@ 8:36 AM

## 2020-05-16 ENCOUNTER — Telehealth: Payer: 59 | Admitting: Obstetrics & Gynecology

## 2020-05-18 ENCOUNTER — Other Ambulatory Visit: Payer: Self-pay | Admitting: Obstetrics & Gynecology

## 2020-05-18 DIAGNOSIS — O021 Missed abortion: Secondary | ICD-10-CM

## 2020-05-18 MED ORDER — MISOPROSTOL 200 MCG PO TABS: ORAL_TABLET | ORAL | 1 refills | Status: DC

## 2020-05-21 ENCOUNTER — Other Ambulatory Visit: Payer: Self-pay

## 2020-05-21 ENCOUNTER — Encounter: Payer: Self-pay | Admitting: Obstetrics & Gynecology

## 2020-05-21 ENCOUNTER — Ambulatory Visit (INDEPENDENT_AMBULATORY_CARE_PROVIDER_SITE_OTHER): Payer: 59 | Admitting: Obstetrics & Gynecology

## 2020-05-21 DIAGNOSIS — O034 Incomplete spontaneous abortion without complication: Secondary | ICD-10-CM | POA: Diagnosis not present

## 2020-05-21 NOTE — Progress Notes (Signed)
History:  40 y.o. G2P1001 here today for f/u of incomplete AB. I spoke to the pt via text and telephone over the weekend. She reported increased bleeding and tissue felt and removed manually at the os. She reports that one chunk of the tissue had a foul odor. The bleeding has not decreased. Pt denies dizziness or SOB. She took 2 doses of Cytotec at the onset about 2 week prev and 2 doses over the past 3 days.    The following portions of the patient's history were reviewed and updated as appropriate: allergies, current medications, past family history, past medical history, past social history, past surgical history and problem list.  Review of Systems:  Pertinent items are noted in HPI.    Objective:  Physical Exam Last menstrual period 03/03/2020, not currently breastfeeding. Temp 98 CONSTITUTIONAL: Well-developed, well-nourished female in no acute distress.  HENT:  Normocephalic, atraumatic EYES: Conjunctivae and EOM are normal. No scleral icterus.  NECK: Normal range of motion SKIN: Skin is warm and dry. No rash noted. Not diaphoretic.No pallor. [place]: Alert and oriented to person, place, and time. Normal coordination.  Abd: Soft, nontender and nondistended Pelvic: Normal appearing external genitalia; normal appearing vaginal mucosa and cervix.  Normal discharge.There is a small amount of blood in the vault. The cervix was probed but, there was no tissues just a small clot. No CMT was noted.   Labs and Imaging TVUS revealed normal ovaries bilaterally. Thin endometrial stripe.     Assessment & Plan:  Incomplete AB now complete. [place] confirms empty uterus.   Expectant management.   Pt reassured.   F/u prn  Mekenzie Modeste L. Harraway-Smith, M.D., [place]lynn June

## 2020-05-24 DIAGNOSIS — E039 Hypothyroidism, unspecified: Secondary | ICD-10-CM | POA: Diagnosis not present

## 2020-05-25 LAB — TSH: TSH: 3.68 mIU/L

## 2020-05-28 ENCOUNTER — Other Ambulatory Visit (HOSPITAL_COMMUNITY)
Admission: RE | Admit: 2020-05-28 | Discharge: 2020-05-28 | Disposition: A | Discharge status: Home / Self Care | Payer: 59 | Source: Ambulatory Visit | Attending: Obstetrics and Gynecology | Admitting: Obstetrics and Gynecology

## 2020-05-28 ENCOUNTER — Other Ambulatory Visit: Payer: Self-pay | Admitting: Osteopathic Medicine

## 2020-05-28 ENCOUNTER — Other Ambulatory Visit: Payer: Self-pay

## 2020-05-28 DIAGNOSIS — N949 Unspecified condition associated with female genital organs and menstrual cycle: Secondary | ICD-10-CM

## 2020-05-28 DIAGNOSIS — N898 Other specified noninflammatory disorders of vagina: Secondary | ICD-10-CM

## 2020-05-28 NOTE — Telephone Encounter (Signed)
Patient is still bleeding from recent miscarriage- treated with cytotec. Patient states she is having vaginal irritation that has been occurring for the last few days. Kathrene Alu RN

## 2020-05-29 ENCOUNTER — Other Ambulatory Visit: Payer: Self-pay

## 2020-05-29 LAB — CERVICOVAGINAL ANCILLARY ONLY
Bacterial Vaginitis (gardnerella): POSITIVE — AB
Candida Glabrata: NEGATIVE
Candida Vaginitis: NEGATIVE
Comment: NEGATIVE
Comment: NEGATIVE
Comment: NEGATIVE

## 2020-05-29 MED ORDER — METRONIDAZOLE 500 MG PO TABS: 500.0000 mg | ORAL_TABLET | Freq: Two times a day (BID) | ORAL | 0 refills | Status: DC

## 2020-05-31 ENCOUNTER — Ambulatory Visit (INDEPENDENT_AMBULATORY_CARE_PROVIDER_SITE_OTHER): Payer: 59

## 2020-05-31 ENCOUNTER — Other Ambulatory Visit: Payer: Self-pay

## 2020-05-31 DIAGNOSIS — O039 Complete or unspecified spontaneous abortion without complication: Secondary | ICD-10-CM | POA: Diagnosis not present

## 2020-05-31 NOTE — Progress Notes (Addendum)
Pt was sent to the lab for repeat Beta hCG. Shakena Callari l Donyell Carrell, CMA   Attestation of Attending Supervision of CMA/RN: Evaluation and management procedures were performed by the nurse under my supervision and collaboration.  I have reviewed the nursing note and chart, and I agree with the management and plan.  Carolyn L. Harraway-Smith, M.D., Cherlynn June

## 2020-06-01 LAB — BETA HCG QUANT (REF LAB): hCG Quant: 343 m[IU]/mL

## 2020-06-04 ENCOUNTER — Other Ambulatory Visit (INDEPENDENT_AMBULATORY_CARE_PROVIDER_SITE_OTHER): Payer: 59

## 2020-06-04 ENCOUNTER — Encounter (HOSPITAL_COMMUNITY): Payer: Self-pay | Admitting: Obstetrics & Gynecology

## 2020-06-04 ENCOUNTER — Other Ambulatory Visit: Payer: Self-pay

## 2020-06-04 DIAGNOSIS — O034 Incomplete spontaneous abortion without complication: Secondary | ICD-10-CM | POA: Diagnosis not present

## 2020-06-04 MED ORDER — DOXYCYCLINE HYCLATE 100 MG IV SOLR
200.0000 mg | INTRAVENOUS | Status: AC
  Filled 2020-06-04: qty 200

## 2020-06-04 NOTE — Progress Notes (Signed)
EKG:  N/A CXR:  N/A ECHO:  Denies Stress Test:  DEnies Cardiac Cath: DEnies  Fasting Blood Sugar- N/A Checks Blood Sugar_N/A__ times a day  OSA/CPAP:  No  ASA/Blood Thinners:  NO  Covid test positive 04/13/20 in Care Everywhere under labs  Anesthesia Review:  No  Patient denies shortness of breath, fever, cough, and chest pain at PAT appointment.  Patient verbalized understanding of instructions provided today at the PAT appointment.  Patient asked to review instructions at home and day of surgery.

## 2020-06-04 NOTE — Progress Notes (Signed)
Pt presents for STAT Beta hCG. Pt was sent to the lab. Bebe Moncure l Courtenay Hirth, CMA

## 2020-06-05 ENCOUNTER — Ambulatory Visit (HOSPITAL_COMMUNITY): Admission: RE | Admit: 2020-06-05 | Payer: 59 | Source: Home / Self Care | Admitting: Obstetrics & Gynecology

## 2020-06-05 DIAGNOSIS — O034 Incomplete spontaneous abortion without complication: Secondary | ICD-10-CM

## 2020-06-05 LAB — BETA HCG QUANT (REF LAB): hCG Quant: 238 m[IU]/mL

## 2020-06-05 SURGERY — DILATION AND EVACUATION, UTERUS
Anesthesia: Choice

## 2020-06-12 ENCOUNTER — Other Ambulatory Visit: Payer: 59

## 2020-06-13 ENCOUNTER — Other Ambulatory Visit: Payer: 59

## 2020-06-13 DIAGNOSIS — O039 Complete or unspecified spontaneous abortion without complication: Secondary | ICD-10-CM | POA: Diagnosis not present

## 2020-06-13 NOTE — Progress Notes (Signed)
Patient sent to lab for repeat HCG. Kathrene Alu RN

## 2020-06-14 LAB — BETA HCG QUANT (REF LAB): hCG Quant: 121 m[IU]/mL

## 2020-07-23 ENCOUNTER — Ambulatory Visit (INDEPENDENT_AMBULATORY_CARE_PROVIDER_SITE_OTHER): Payer: 59 | Admitting: Osteopathic Medicine

## 2020-07-23 ENCOUNTER — Encounter: Payer: Self-pay | Admitting: Osteopathic Medicine

## 2020-07-23 VITALS — BP 108/67 | HR 58 | Temp 97.7°F | Wt 200.0 lb

## 2020-07-23 DIAGNOSIS — E039 Hypothyroidism, unspecified: Secondary | ICD-10-CM

## 2020-07-23 DIAGNOSIS — Z0000 Encounter for general adult medical examination without abnormal findings: Secondary | ICD-10-CM

## 2020-07-23 MED ORDER — VALACYCLOVIR HCL 1 G PO TABS: 2000.0000 mg | ORAL_TABLET | Freq: Two times a day (BID) | ORAL | 0 refills | Status: AC

## 2020-07-23 NOTE — Patient Instructions (Addendum)
General Preventive Care  Most recent routine screening labs: ordered today.   Blood pressure goal 130/80 or less.   Tobacco: don't!   Alcohol: responsible moderation is ok for most adults - if you have concerns about your alcohol intake, please talk to me!   Exercise: as tolerated to reduce risk of cardiovascular disease and diabetes. Strength training will also prevent osteoporosis.   Mental health: if need for mental health care (medicines, counseling, other), or concerns about moods, please let me know!   Sexual / Reproductive health: if ever need for STI testing, if concerns with libido/pain problems, IF need to discuss family planning - please let me or OBGYN know!   Advanced Directive: Living Will and/or Healthcare Power of Attorney recommended for all adults, regardless of age or health.  Vaccines  Flu vaccine: every fall/winter  Shingles vaccine: after age 70.   Pneumonia vaccines: after age 62  HPV vaccine: Gardasil up to age 74  COVID vaccine: THANKS for getting your vaccine! :)  Cancer screenings   Colon cancer screening: for everyone age 68-75.   Breast cancer screening: mammogram starting at age 23  Cervical cancer screening: Pap every 3-5 years if normal / per OBGYN.   Lung cancer screening: not needed for non-smokers  Infection screenings  . HIV: recommended screening at least once age 16-65 . Gonorrhea/Chlamydia: screening as needed. . Hepatitis C: recommended once for everyone age 82-75 . TB: certain at-risk populations

## 2020-07-23 NOTE — Progress Notes (Signed)
Tina [name] is a 40 y.o. female who presents to  [place] [facility]  today, 07/23/20, seeking care for the following:  . Annual physical      ASSESSMENT & PLAN with other pertinent findings:  The primary encounter diagnosis was Annual physical exam. A diagnosis of Acquired hypothyroidism was also pertinent to this visit.    Patient Instructions  General Preventive Care  Most recent routine screening labs: ordered today.   Blood pressure goal 130/80 or less.   Tobacco: don't!   Alcohol: responsible moderation is ok for most adults - if you have concerns about your alcohol intake, please talk to me!   Exercise: as tolerated to reduce risk of cardiovascular disease and diabetes. Strength training will also prevent osteoporosis.   Mental health: if need for mental health care (medicines, counseling, other), or concerns about moods, please let me know!   Sexual / Reproductive health: if ever need for STI testing, if concerns with libido/pain problems, IF need to discuss family planning - please let me or OBGYN know!   Advanced Directive: Living Will and/or Healthcare Power of Attorney recommended for all adults, regardless of age or health.  Vaccines  Flu vaccine: every fall/winter  Shingles vaccine: after age 42.   Pneumonia vaccines: after age 84  HPV vaccine: Gardasil up to age 78  COVID vaccine: THANKS for getting your vaccine! :)  Cancer screenings   Colon cancer screening: for everyone age 12-75.   Breast cancer screening: mammogram starting at age 38  Cervical cancer screening: Pap every 3-5 years if normal / per OBGYN.   Lung cancer screening: not needed for non-smokers  Infection screenings  . HIV: recommended screening at least once age 81-65 . Gonorrhea/Chlamydia: screening as needed. . Hepatitis C: recommended once for everyone age 40-75 . TB: certain at-risk populations   Orders Placed This  Encounter  Procedures  . CBC  . COMPLETE METABOLIC PANEL WITH GFR  . Lipid panel  . TSH    Meds ordered this encounter  Medications  . valACYclovir (VALTREX) 1000 MG tablet    Sig: Take 2 tablets (2,000 mg total) by mouth 2 (two) times daily for 1 day. For one day as needed for cold sore    Dispense:  12 tablet    Refill:  0     See below for relevant physical exam findings  See below for recent lab and imaging results reviewed  Medications, allergies, PMH, PSH, SocH, FamH reviewed below    Follow-up instructions: Return in about 1 year (around 07/23/2021) for ANNUAL CHECK-UP - SEE [place] SOONER IF NEEDED.                                        Exam:  BP 108/67 (BP Location: Left Arm, Patient Position: Sitting, Cuff Size: Normal)   Pulse (!) 58   Temp 97.7 F (36.5 C) (Oral)   Wt 200 lb (90.7 kg)   LMP 03/03/2020   Breastfeeding Unknown   BMI 29.53 kg/m   Constitutional: VS see above. General Appearance: alert, well-developed, well-nourished, NAD  Neck: No masses, trachea midline.   Respiratory: Normal respiratory effort. no wheeze, no rhonchi, no rales  Cardiovascular: S1/S2 normal, no murmur, no rub/gallop auscultated. RRR.   Musculoskeletal: Gait normal. Symmetric and independent movement of all extremities  Abdominal: non-tender, non-distended, no appreciable organomegaly, neg  Murphy's, BS WNLx4  Neurological: Normal balance/coordination. No tremor.  Skin: warm, dry, intact.   Psychiatric: Normal judgment/insight. Normal mood and affect. Oriented x3.   Current Meds  Medication Sig  . acetaminophen (TYLENOL) 500 MG tablet Take 1,000 mg by mouth every 6 (six) hours as needed for mild pain or headache.  . levothyroxine (SYNTHROID) 112 MCG tablet TAKE 1 TABLET (112 MCG TOTAL) BY MOUTH DAILY. (DUE FOR LABS FOR FURTHER REFILLS)  . Prenatal Vit-Fe Fumarate-FA (PRENATAL MULTIVITAMIN) TABS tablet Take 1 tablet by mouth at bedtime.    . Psyllium (METAMUCIL PO) Take 1 Scoop by mouth daily. 1 tablespoon.  . valACYclovir (VALTREX) 1000 MG tablet Take 2 tablets (2,000 mg total) by mouth 2 (two) times daily for 1 day. For one day as needed for cold sore    Allergies  Allergen Reactions  . Augmentin [Amoxicillin-Pot Clavulanate] Rash  . Penicillins Other (See Comments)    Per allergist Causes inflammation of the blood vessels. Did it involve swelling of the face/tongue/throat, SOB, or low BP? No Did it involve sudden or severe rash/hives, skin peeling, or any reaction on the inside of your mouth or nose? No Did you need to seek medical attention at a hospital or doctor's office? Yes When did it last happen? Summer of 2017 If all above answers are "NO", may proceed with cephalosporin use.     Patient Active Problem List   Diagnosis Date Noted  . Pain aggravated by breast feeding 08/01/2019  . Advanced maternal age, primigravida 06/03/2019  . Infection in pregnancy, antepartum 01/03/2019  . Supervision of high risk pregnancy, antepartum 11/01/2018  . Advanced maternal age, primigravida, antepartum 11/01/2018  . Twin reversed-arterial perfusion (TRAP) sequence pregnancy, antepartum 11/01/2018  . Paresthesias 04/01/2018  . Nevus of scalp 01/07/2018  . 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 Melasma 01/07/2018  . 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 Hypothyroid in pregnancy, antepartum 03/15/2017  . History of rectal bleeding 03/15/2017    Family History  Problem Relation Age of Onset  . Arthritis Mother   . Leukemia Mother   . Other Sister        accessory pathways in heart  . Stroke Maternal Grandmother   . Stroke Paternal Grandmother   . Stroke Paternal Grandfather     Social History   Tobacco Use  Smoking Status Never Smoker  Smokeless Tobacco Never Used    Past Surgical History:  Procedure Laterality Date  . NO PAST SURGERIES      Immunization History  Administered Date(s)  Administered  . DTaP 07/04/1985, 08/26/1985, 10/31/1985, 11/07/1986, 11/29/1990  . HPV Quadrivalent 12/31/2005, 03/24/2006, 09/17/2006  . Hepatitis A, Adult 05/21/2018  . Hepatitis B 03/03/1987, 04/05/2016  . Hepatitis B, adult 04/05/2016  . HiB (PRP-OMP) 11/07/1986  . Influenza Whole 02/02/2018  . Influenza, Seasonal, Injecte, Preservative Fre 12/20/2013, 02/03/2015, 02/18/2016  . Influenza,inj,Quad PF,6+ Mos 01/14/2015, 01/03/2019, 01/19/2020  . Influenza,inj,quad, With Preservative 01/14/2015  . Influenza,trivalent, recombinat, inj, PF 01/19/2014  . Influenza-Unspecified 12/20/2013, 02/03/2015, 02/18/2016, 01/03/2019  . MMR 09/14/1996  . PFIZER(Purple Top)SARS-COV-2 Vaccination 06/10/2019, 07/01/2019  . Pneumococcal-Unspecified 11/29/1990  . Tdap 04/21/2006, 03/20/2016, 02/28/2019  . Typhoid Live 05/21/2018    Recent Results (from the past 2160 hour(s))  TSH     Status: None   Collection Time: 05/24/20 11:35 AM  Result Value Ref Range   TSH 3.68 mIU/L    Comment:           Reference Range .           >  or = 20 Years  0.40-4.50 .                Pregnancy Ranges           First trimester    0.26-2.66           Second trimester   0.55-2.73           Third trimester    0.43-2.91   Cervicovaginal ancillary only( [place])     Status: Abnormal   Collection Time: 05/28/20  8:58 AM  Result Value Ref Range   Bacterial Vaginitis (gardnerella) Positive (A)    Candida Vaginitis Negative    Candida Glabrata Negative    Comment      Normal Reference Range Bacterial Vaginosis - Negative   Comment Normal Reference Range Candida Species - Negative    Comment Normal Reference Range Candida Galbrata - Negative   Beta hCG quant (ref lab)     Status: None   Collection Time: 05/31/20  8:53 AM  Result Value Ref Range   hCG Quant 343 mIU/mL    Comment:                      Female (Non-pregnant)    0 -     5                             (Postmenopausal)  0 -     8                       Female (Pregnant)                      Weeks of Gestation                              3                6 -    71                              4               10 -   750                              5              217 -  7138                              6              158 - [place]                              7             [phone]                              8            [id number] [phone]  [place] [facility] E CLIA methodology   Beta hCG quant (ref lab)     Status: None   Collection Time: 06/04/20  3:27 PM  Result Value Ref Range   hCG Quant 238 mIU/mL    Comment:                      Female (Non-pregnant)    0 -     5                             (Postmenopausal)  0 -     8                      Female (Pregnant)                      Weeks of Gestation                              3                6 -    71                              4               10 -   750  5              217 -  7138                              6              [place] Roche E CLIA methodology   Beta hCG quant (ref lab)     Status: None   Collection Time: 06/13/20  2:34 PM  Result Value Ref Range   hCG Quant 121 mIU/mL    Comment:                      Female (Non-pregnant)    0 -     5                             (Postmenopausal)  0 -     8                      Female (Pregnant)                      Weeks of Gestation                              3                6 -    71                              4               10 -   750                              5              217 -  7138                              6              158 - [place]                              7             [phone]                              [place]                             [place] CLIA methodology     No results found.     All questions at time of visit were answered - patient instructed to contact office with any additional concerns or updates. ER/RTC precautions were reviewed with the patient as applicable.   Please note: manual typing as well as voice recognition software may have been used to produce this document - typos may escape review. Please contact Dr. Sheppard Coil for any needed clarifications.

## 2020-07-24 DIAGNOSIS — E039 Hypothyroidism, unspecified: Secondary | ICD-10-CM | POA: Diagnosis not present

## 2020-07-24 DIAGNOSIS — Z0000 Encounter for general adult medical examination without abnormal findings: Secondary | ICD-10-CM | POA: Diagnosis not present

## 2020-07-25 LAB — LIPID PANEL
Cholesterol: 137 mg/dL (ref <200)
HDL: 58 mg/dL (ref >=50)
LDL Cholesterol (Calc): 70 mg/dL (calc)
Non-HDL Cholesterol (Calc): 79 mg/dL (calc) (ref <130)
Total CHOL/HDL Ratio: 2.4 (calc) (ref <5.0)
Triglycerides: 32 mg/dL (ref <150)

## 2020-07-25 LAB — COMPLETE METABOLIC PANEL WITH GFR
AG Ratio: 1.7 (calc) (ref 1.0–2.5)
ALT: 10 U/L (ref 6–29)
AST: 10 U/L (ref 10–30)
Albumin: 4.3 g/dL (ref 3.6–5.1)
Alkaline phosphatase (APISO): 52 U/L (ref 31–125)
BUN: 13 mg/dL (ref 7–25)
CO2: 27 mmol/L (ref 20–32)
Calcium: 9 mg/dL (ref 8.6–10.2)
Chloride: 108 mmol/L (ref 98–110)
Creat: 0.84 mg/dL (ref 0.50–1.10)
GFR, Est African American: 102 mL/min/{1.73_m2} (ref >=60)
GFR, Est Non African American: 88 mL/min/{1.73_m2} (ref >=60)
Globulin: 2.5 g/dL (calc) (ref 1.9–3.7)
Glucose, Bld: 88 mg/dL (ref 65–99)
Potassium: 4.5 mmol/L (ref 3.5–5.3)
Sodium: 140 mmol/L (ref 135–146)
Total Bilirubin: 0.4 mg/dL (ref 0.2–1.2)
Total Protein: 6.8 g/dL (ref 6.1–8.1)

## 2020-07-25 LAB — CBC
HCT: 40.4 % (ref 35.0–45.0)
Hemoglobin: 13.2 g/dL (ref 11.7–15.5)
MCH: 30.3 pg (ref 27.0–33.0)
MCHC: 32.7 g/dL (ref 32.0–36.0)
MCV: 92.7 fL (ref 80.0–100.0)
MPV: 10.9 fL (ref 7.5–12.5)
Platelets: 251 10*3/uL (ref 140–400)
RBC: 4.36 10*6/uL (ref 3.80–5.10)
RDW: 12.8 % (ref 11.0–15.0)
WBC: 4.1 10*3/uL (ref 3.8–10.8)

## 2020-07-25 LAB — TSH: TSH: 2.6 mIU/L

## 2020-08-13 ENCOUNTER — Other Ambulatory Visit: Payer: Self-pay | Admitting: Obstetrics & Gynecology

## 2020-08-13 DIAGNOSIS — O219 Vomiting of pregnancy, unspecified: Secondary | ICD-10-CM

## 2020-08-13 MED ORDER — DOXYLAMINE-PYRIDOXINE 10-10 MG PO TBEC: 2.0000 | DELAYED_RELEASE_TABLET | Freq: Every day | ORAL | 3 refills | Status: DC

## 2020-08-21 ENCOUNTER — Other Ambulatory Visit: Payer: Self-pay

## 2020-08-21 ENCOUNTER — Telehealth: Payer: Self-pay

## 2020-08-21 DIAGNOSIS — O09299 Supervision of pregnancy with other poor reproductive or obstetric history, unspecified trimester: Secondary | ICD-10-CM

## 2020-08-21 DIAGNOSIS — Z3201 Encounter for pregnancy test, result positive: Secondary | ICD-10-CM

## 2020-08-21 NOTE — Telephone Encounter (Signed)
Orders place for ultrasound for viability per Dr. Ihor Dow. Kathrene Alu RN

## 2020-08-24 ENCOUNTER — Other Ambulatory Visit: Payer: Self-pay | Admitting: Osteopathic Medicine

## 2020-09-04 ENCOUNTER — Ambulatory Visit: Payer: 59

## 2020-09-07 ENCOUNTER — Encounter: Payer: Self-pay | Admitting: Obstetrics & Gynecology

## 2020-09-07 ENCOUNTER — Ambulatory Visit (INDEPENDENT_AMBULATORY_CARE_PROVIDER_SITE_OTHER): Payer: Commercial Managed Care - PPO | Admitting: Obstetrics & Gynecology

## 2020-09-07 ENCOUNTER — Other Ambulatory Visit: Payer: Self-pay

## 2020-09-07 VITALS — BP 113/59 | HR 62 | Wt 197.0 lb

## 2020-09-07 DIAGNOSIS — B009 Herpesviral infection, unspecified: Secondary | ICD-10-CM

## 2020-09-07 DIAGNOSIS — Z3480 Encounter for supervision of other normal pregnancy, unspecified trimester: Secondary | ICD-10-CM

## 2020-09-07 DIAGNOSIS — O99280 Endocrine, nutritional and metabolic diseases complicating pregnancy, unspecified trimester: Secondary | ICD-10-CM

## 2020-09-07 DIAGNOSIS — O09529 Supervision of elderly multigravida, unspecified trimester: Secondary | ICD-10-CM | POA: Insufficient documentation

## 2020-09-07 DIAGNOSIS — O98519 Other viral diseases complicating pregnancy, unspecified trimester: Secondary | ICD-10-CM

## 2020-09-07 DIAGNOSIS — E079 Disorder of thyroid, unspecified: Secondary | ICD-10-CM

## 2020-09-07 DIAGNOSIS — E039 Hypothyroidism, unspecified: Secondary | ICD-10-CM

## 2020-09-07 NOTE — Progress Notes (Signed)
  Subjective:    Tina [name] is being seen today for her first obstetrical visit. LMP equal to first trimester [place] in ofc today.  This is a planned pregnancy. She is at Unknown gestation. Her obstetrical history is significant for advanced maternal age and thyroid disease . 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 Delivered healthy term singleton.  Relationship with FOB: spouse, living together. Patient does intend to breast feed. Pregnancy history fully reviewed.  Patient reports no complaints.  Review of Systems:   Review of Systems: prev pregnancy was a twin gestation with a lost twin.   Objective:     BP (!) 113/59   Pulse 62   Wt 197 lb (89.4 kg)   LMP 03/03/2020   BMI 29.09 kg/m  Physical Exam  Exam General Appearance:    Alert, cooperative, no distress, appears stated age  Head:    Normocephalic, without obvious abnormality, atraumatic  Eyes:    conjunctiva/corneas clear, EOM's intact, both eyes  Ears:    Normal external ear canals, both ears  Nose:   Nares normal, septum midline, mucosa normal, no drainage    or sinus tenderness  Throat:   Lips, mucosa, and tongue normal; teeth and gums normal  Neck:   Supple, symmetrical, trachea midline, no adenopathy;    thyroid:  no enlargement/tenderness/nodules  Back:     Symmetric, no curvature, ROM normal, no CVA tenderness  Lungs:     respirations unlabored  Chest Wall:    No tenderness or deformity   Heart:    Regular rate and rhythm  Breast Exam:    No tenderness, masses, or nipple abnormality  Abdomen:     Soft, non-tender, bowel sounds active all four quadrants,    no masses, no organomegaly  Genitalia:    Normal female without lesion, discharge or tenderness     Extremities:   Extremities normal, atraumatic, no cyanosis or edema  Pulses:   2+ and symmetric all extremities  Skin:   Skin color, texture, turgor normal, no rashes or lesions     Assessment:    Pregnancy:  G3P1001 Patient Active Problem List   Diagnosis Date Noted  . Supervision of other normal pregnancy, antepartum 09/07/2020  . Pain aggravated by breast feeding 08/01/2019  . Advanced maternal age, primigravida 06/03/2019  . Infection in pregnancy, antepartum 01/03/2019  . Supervision of high risk pregnancy, antepartum 11/01/2018  . Advanced maternal age, primigravida, antepartum 11/01/2018  . Twin reversed-arterial perfusion (TRAP) sequence pregnancy, antepartum 11/01/2018  . Paresthesias 04/01/2018  . Nevus of scalp 01/07/2018  . 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 Melasma 01/07/2018  . 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 Hypothyroid in pregnancy, antepartum 03/15/2017  . History of rectal bleeding 03/15/2017       Plan:     Initial labs drawn. Prenatal vitamins. Problem list reviewed and updated. AFP3 discussed: requested. Role of ultrasound in pregnancy discussed; fetal survey: requested. Amniocentesis discussed: not indicated. Follow up in 4 weeks. 60% of 40 min visit spent on counseling and coordination of care.  TSH redrawn   [name] 09/07/2020

## 2020-09-08 LAB — CBC/D/PLT+RPR+RH+ABO+RUBIGG...
Antibody Screen: NEGATIVE
Basophils Absolute: 0.1 10*3/uL (ref 0.0–0.2)
Basos: 1 %
EOS (ABSOLUTE): 0.1 10*3/uL (ref 0.0–0.4)
Eos: 2 %
HCV Ab: <0.1 s/co ratio (ref 0.0–0.9)
HIV Screen 4th Generation wRfx: NONREACTIVE
Hematocrit: 39.5 % (ref 34.0–46.6)
Hemoglobin: 13 g/dL (ref 11.1–15.9)
Hepatitis B Surface Ag: NEGATIVE
Immature Grans (Abs): 0 10*3/uL (ref 0.0–0.1)
Immature Granulocytes: 0 %
Lymphocytes Absolute: 1.8 10*3/uL (ref 0.7–3.1)
Lymphs: 23 %
MCH: 29.3 pg (ref 26.6–33.0)
MCHC: 32.9 g/dL (ref 31.5–35.7)
MCV: 89 fL (ref 79–97)
Monocytes Absolute: 0.5 10*3/uL (ref 0.1–0.9)
Monocytes: 7 %
Neutrophils Absolute: 5.3 10*3/uL (ref 1.4–7.0)
Neutrophils: 67 %
Platelets: 329 10*3/uL (ref 150–450)
RBC: 4.43 x10E6/uL (ref 3.77–5.28)
RDW: 13.7 % (ref 11.7–15.4)
RPR Ser Ql: NONREACTIVE
Rh Factor: POSITIVE
Rubella Antibodies, IGG: 6.06 index (ref >0.99)
WBC: 7.8 10*3/uL (ref 3.4–10.8)

## 2020-09-08 LAB — HCV INTERPRETATION

## 2020-09-08 LAB — TSH: TSH: 20.9 u[IU]/mL — ABNORMAL HIGH (ref 0.450–4.500)

## 2020-09-09 LAB — CULTURE, OB URINE

## 2020-09-09 LAB — URINE CULTURE, OB REFLEX

## 2020-09-10 LAB — GC/CHLAMYDIA PROBE AMP (~~LOC~~) NOT AT ARMC
Chlamydia: NEGATIVE
Comment: NEGATIVE
Comment: NORMAL
Neisseria Gonorrhea: NEGATIVE

## 2020-09-11 ENCOUNTER — Other Ambulatory Visit: Payer: Self-pay | Admitting: Obstetrics & Gynecology

## 2020-09-11 DIAGNOSIS — E039 Hypothyroidism, unspecified: Secondary | ICD-10-CM

## 2020-09-11 DIAGNOSIS — O99280 Endocrine, nutritional and metabolic diseases complicating pregnancy, unspecified trimester: Secondary | ICD-10-CM

## 2020-09-11 LAB — SPECIMEN STATUS REPORT

## 2020-09-11 LAB — T3: T3, Total: 87 ng/dL (ref 71–180)

## 2020-09-11 LAB — T4: T4, Total: 8.6 ug/dL (ref 4.5–12.0)

## 2020-09-11 MED ORDER — LEVOTHYROXINE SODIUM 137 MCG PO TABS: 137.0000 ug | ORAL_TABLET | Freq: Every day | ORAL | 3 refills | Status: DC

## 2020-09-26 ENCOUNTER — Ambulatory Visit (INDEPENDENT_AMBULATORY_CARE_PROVIDER_SITE_OTHER): Payer: Commercial Managed Care - PPO

## 2020-09-26 ENCOUNTER — Other Ambulatory Visit: Payer: Self-pay

## 2020-09-26 DIAGNOSIS — Z36 Encounter for antenatal screening for chromosomal anomalies: Secondary | ICD-10-CM

## 2020-09-26 DIAGNOSIS — Z3490 Encounter for supervision of normal pregnancy, unspecified, unspecified trimester: Secondary | ICD-10-CM

## 2020-09-26 NOTE — Progress Notes (Signed)
Pt here for Panorama. Blood drawn. Pt is aware she will be contacted with results. Pt does not want to know gender.

## 2020-10-01 ENCOUNTER — Ambulatory Visit (INDEPENDENT_AMBULATORY_CARE_PROVIDER_SITE_OTHER): Payer: Commercial Managed Care - PPO | Admitting: Obstetrics & Gynecology

## 2020-10-01 ENCOUNTER — Encounter: Payer: Self-pay | Admitting: Obstetrics & Gynecology

## 2020-10-01 ENCOUNTER — Other Ambulatory Visit: Payer: Self-pay

## 2020-10-01 VITALS — BP 102/61 | HR 66 | Wt 197.0 lb

## 2020-10-01 DIAGNOSIS — Z3480 Encounter for supervision of other normal pregnancy, unspecified trimester: Secondary | ICD-10-CM

## 2020-10-01 DIAGNOSIS — E039 Hypothyroidism, unspecified: Secondary | ICD-10-CM

## 2020-10-01 DIAGNOSIS — Z3A11 11 weeks gestation of pregnancy: Secondary | ICD-10-CM

## 2020-10-01 DIAGNOSIS — O09519 Supervision of elderly primigravida, unspecified trimester: Secondary | ICD-10-CM

## 2020-10-01 DIAGNOSIS — O99280 Endocrine, nutritional and metabolic diseases complicating pregnancy, unspecified trimester: Secondary | ICD-10-CM

## 2020-10-01 DIAGNOSIS — O98919 Unspecified maternal infectious and parasitic disease complicating pregnancy, unspecified trimester: Secondary | ICD-10-CM

## 2020-10-01 NOTE — Progress Notes (Addendum)
   PRENATAL VISIT NOTE  Subjective:  Tina [name] is a 40 y.o. G3P1001 at [redacted]w[redacted]d being seen today for ongoing prenatal care.  She is currently monitored for the following issues for this high-risk pregnancy and has Hypothyroid in pregnancy, antepartum; History of rectal bleeding; Nevus of scalp; Neoplasm of uncertain behavior of skin; Melasma; Nevus spilus; Paresthesias; Supervision of high risk pregnancy, antepartum; Twin reversed-arterial perfusion (TRAP) sequence pregnancy, antepartum; Infection in pregnancy, antepartum; Advanced maternal age, primigravida; and Supervision of other normal pregnancy, antepartum on their problem list. Her N/V have resolved.   Patient reports no complaints.  Contractions: Not present. Vag. Bleeding: None.  Movement: Absent. Denies leaking of fluid.   The following portions of the patient's history were reviewed and updated as appropriate: allergies, current medications, past family history, past medical history, past social history, past surgical history and problem list.   Objective:   Vitals:   10/01/20 0807  BP: 102/61  Pulse: 66  Weight: 197 lb (89.4 kg)    Fetal Status: Fetal Heart Rate (bpm): 166   Movement: Absent     General:  Alert, oriented and cooperative. Patient is in no acute distress.  Skin: Skin is warm and dry. No rash noted.   Cardiovascular: Normal heart rate noted  Respiratory: Normal respiratory effort, no problems with respiration noted  Abdomen: Soft, gravid, appropriate for gestational age.  Pain/Pressure: Absent     Pelvic: Cervical exam deferred        Extremities: Normal range of motion.  Edema: None  Mental Status: Normal mood and affect. Normal behavior. Normal judgment and thought content.   Assessment and Plan:  Pregnancy: G3P1001 at [redacted]w[redacted]d 1. Supervision of other normal pregnancy, antepartum FHR WNL   2. Hypothyroid in pregnancy, antepartum On current increased dosage of the medications. Need to recheck level in  6 weeks.   3. Advanced maternal age, primigravida, antepartum Has completed NIPS. Results pending.  Begin baby ASA  4. Infection in pregnancy, antepartum Needs suppression at 36 weeks  Preterm labor symptoms and general obstetric precautions including but not limited to vaginal bleeding, contractions, leaking of fluid and fetal movement were reviewed in detail with the patient. Please refer to After Visit Summary for other counseling recommendations.   Return in about 4 weeks (around 10/29/2020) for in person.  Future Appointments  Date Time Provider [place]  10/31/2020  8:45 AM [name], MD [facility] None    [name], MD

## 2020-10-31 ENCOUNTER — Ambulatory Visit (INDEPENDENT_AMBULATORY_CARE_PROVIDER_SITE_OTHER): Payer: Commercial Managed Care - PPO | Admitting: Obstetrics & Gynecology

## 2020-10-31 ENCOUNTER — Other Ambulatory Visit: Payer: Self-pay

## 2020-10-31 ENCOUNTER — Encounter: Payer: Self-pay | Admitting: Obstetrics & Gynecology

## 2020-10-31 VITALS — BP 103/61 | HR 69 | Wt 196.0 lb

## 2020-10-31 DIAGNOSIS — O09519 Supervision of elderly primigravida, unspecified trimester: Secondary | ICD-10-CM

## 2020-10-31 DIAGNOSIS — Z3480 Encounter for supervision of other normal pregnancy, unspecified trimester: Secondary | ICD-10-CM

## 2020-10-31 DIAGNOSIS — O98919 Unspecified maternal infectious and parasitic disease complicating pregnancy, unspecified trimester: Secondary | ICD-10-CM

## 2020-10-31 NOTE — Progress Notes (Signed)
   PRENATAL VISIT NOTE  Subjective:  Tina [name] is a 40 y.o. G3P1001 at [redacted]w[redacted]d being seen today for ongoing prenatal care.  She is currently monitored for the following issues for this high-risk pregnancy and has Hypothyroid in pregnancy, antepartum; Supervision of high risk pregnancy, antepartum; Twin reversed-arterial perfusion (TRAP) sequence pregnancy, antepartum; Infection in pregnancy, antepartum; Advanced maternal age, primigravida; and Supervision of other normal pregnancy, antepartum on their problem list.  Patient reports  mild round ligament pain. No appetite following 3pm. Sl nausea improved with meds.   .  Contractions: Not present. Vag. Bleeding: None.  Movement: Absent. Denies leaking of fluid.   The following portions of the patient's history were reviewed and updated as appropriate: allergies, current medications, past family history, past medical history, past social history, past surgical history and problem list.   Objective:   Vitals:   10/31/20 0931  BP: 103/61  Pulse: 69  Weight: 196 lb (88.9 kg)    Fetal Status: Fetal Heart Rate (bpm): 150   Movement: Absent     General:  Alert, oriented and cooperative. Patient is in no acute distress.  Skin: Skin is warm and dry. No rash noted.   Cardiovascular: Normal heart rate noted  Respiratory: Normal respiratory effort, no problems with respiration noted  Abdomen: Soft, gravid, appropriate for gestational age.  Pain/Pressure: Absent     Pelvic: Cervical exam deferred        Extremities: Normal range of motion.  Edema: None  Mental Status: Normal mood and affect. Normal behavior. Normal judgment and thought content.   Assessment and Plan:  Pregnancy: G3P1001 at [redacted]w[redacted]d 1. Supervision of other normal pregnancy, antepartum FHR wnl AFP today   2. Advanced maternal age, primigravida, antepartum On baby ASA   3. Infection in pregnancy, antepartum Suppression at 36 weeks  4. Hypothyroidism Thyroid panel today      Preterm labor symptoms and general obstetric precautions including but not limited to vaginal bleeding, contractions, leaking of fluid and fetal movement were reviewed in detail with the patient. Please refer to After Visit Summary for other counseling recommendations.   No follow-ups on file.  Future Appointments  Date Time Provider [place]  11/23/2020  8:15 AM [facility] NURSE [facility]  11/23/2020  8:30 AM [facility] US3 [facility]    [name], MD

## 2020-11-02 ENCOUNTER — Other Ambulatory Visit: Payer: Self-pay | Admitting: Obstetrics & Gynecology

## 2020-11-02 DIAGNOSIS — E039 Hypothyroidism, unspecified: Secondary | ICD-10-CM

## 2020-11-02 DIAGNOSIS — O99280 Endocrine, nutritional and metabolic diseases complicating pregnancy, unspecified trimester: Secondary | ICD-10-CM

## 2020-11-02 LAB — AFP, SERUM, OPEN SPINA BIFIDA
AFP MoM: 1.68
AFP Value: 43.2 ng/mL
Gest. Age on Collection Date: 15 weeks
Maternal Age At EDD: 38.9 yr
OSBR Risk 1 IN: 1713
Test Results:: NEGATIVE
Weight: 196 [lb_av]

## 2020-11-02 LAB — TSH: TSH: 14.6 u[IU]/mL — ABNORMAL HIGH (ref 0.450–4.500)

## 2020-11-02 LAB — T3, FREE: T3, Free: 2 pg/mL (ref 2.0–4.4)

## 2020-11-02 LAB — T4, FREE: Free T4: 1.21 ng/dL (ref 0.82–1.77)

## 2020-11-02 MED ORDER — LEVOTHYROXINE SODIUM 150 MCG PO TABS: 150.0000 ug | ORAL_TABLET | Freq: Every day | ORAL | 3 refills | Status: DC

## 2020-11-21 ENCOUNTER — Encounter: Payer: Self-pay | Admitting: Obstetrics & Gynecology

## 2020-11-21 ENCOUNTER — Other Ambulatory Visit: Payer: Self-pay

## 2020-11-21 ENCOUNTER — Ambulatory Visit (INDEPENDENT_AMBULATORY_CARE_PROVIDER_SITE_OTHER): Payer: Commercial Managed Care - PPO | Admitting: Obstetrics & Gynecology

## 2020-11-21 VITALS — BP 104/63 | HR 70 | Wt 200.0 lb

## 2020-11-21 DIAGNOSIS — Z3A18 18 weeks gestation of pregnancy: Secondary | ICD-10-CM

## 2020-11-21 DIAGNOSIS — O99280 Endocrine, nutritional and metabolic diseases complicating pregnancy, unspecified trimester: Secondary | ICD-10-CM

## 2020-11-21 DIAGNOSIS — E039 Hypothyroidism, unspecified: Secondary | ICD-10-CM

## 2020-11-21 DIAGNOSIS — O09522 Supervision of elderly multigravida, second trimester: Secondary | ICD-10-CM

## 2020-11-21 NOTE — Progress Notes (Signed)
   PRENATAL VISIT NOTE  Subjective:  Tina [name] is a 40 y.o. G2P1001 at 30w5dbeing seen today for ongoing prenatal care.  She is currently monitored for the following issues for this low-risk pregnancy and has Hypothyroidism in pregnancy, antepartum and Supervision of pregnancy for multigravida of advanced maternal age on their problem list.  Patient reports no complaints.  Contractions: Not present. Vag. Bleeding: None.  Movement: Present. Denies leaking of fluid.   The following portions of the patient's history were reviewed and updated as appropriate: allergies, current medications, past family history, past medical history, past social history, past surgical history and problem list.   Objective:   Vitals:   11/21/20 1036  BP: 104/63  Pulse: 70  Weight: 200 lb (90.7 kg)    Fetal Status: Fetal Heart Rate (bpm): 160   Movement: Present     General:  Alert, oriented and cooperative. Patient is in no acute distress.  Skin: Skin is warm and dry. No rash noted.   Cardiovascular: Normal heart rate noted  Respiratory: Normal respiratory effort, no problems with respiration noted  Abdomen: Soft, gravid, appropriate for gestational age.  Pain/Pressure: Absent     Pelvic: Cervical exam deferred        Extremities: Normal range of motion.  Edema: None  Mental Status: Normal mood and affect. Normal behavior. Normal judgment and thought content.   Assessment and Plan:  Pregnancy: G2P1001 at 165w5d. Hypothyroidism in pregnancy, antepartum On Synthroid 150 mcg now, will recheck labs next week and adjust regimen accordingly. - TSH; Future - T4, free; Future - T3, free; Future  2. [redacted] weeks gestation of pregnancy 3. Encounter for supervision of multigravida of advanced maternal age in second trimester Negative AFP. Already scheduled for anatomy scan. No other complaints or concerns.  Routine obstetric precautions reviewed.  Please refer to After Visit Summary for other  counseling recommendations.   No follow-ups on file.  Future Appointments  Date Time Provider [place]8/08/2020  8:15 AM [facility]  8:30 AM [facility]  4:10 PM HaLavonia DraftsMD [facility] None    UgVerita SchneidersMD

## 2020-11-23 ENCOUNTER — Ambulatory Visit: Payer: Commercial Managed Care - PPO | Attending: Obstetrics & Gynecology

## 2020-11-23 ENCOUNTER — Ambulatory Visit: Payer: Commercial Managed Care - PPO | Admitting: *Deleted

## 2020-11-23 ENCOUNTER — Encounter: Payer: Self-pay | Admitting: *Deleted

## 2020-11-23 ENCOUNTER — Other Ambulatory Visit: Payer: Self-pay

## 2020-11-23 ENCOUNTER — Other Ambulatory Visit: Payer: Self-pay | Admitting: *Deleted

## 2020-11-23 VITALS — BP 107/56 | HR 74

## 2020-11-23 DIAGNOSIS — O99280 Endocrine, nutritional and metabolic diseases complicating pregnancy, unspecified trimester: Secondary | ICD-10-CM | POA: Diagnosis present

## 2020-11-23 DIAGNOSIS — O09519 Supervision of elderly primigravida, unspecified trimester: Secondary | ICD-10-CM | POA: Diagnosis present

## 2020-11-23 DIAGNOSIS — Z3A11 11 weeks gestation of pregnancy: Secondary | ICD-10-CM

## 2020-11-23 DIAGNOSIS — E039 Hypothyroidism, unspecified: Secondary | ICD-10-CM | POA: Insufficient documentation

## 2020-11-23 DIAGNOSIS — Z3480 Encounter for supervision of other normal pregnancy, unspecified trimester: Secondary | ICD-10-CM

## 2020-11-23 DIAGNOSIS — O09522 Supervision of elderly multigravida, second trimester: Secondary | ICD-10-CM

## 2020-11-28 ENCOUNTER — Telehealth: Payer: Self-pay

## 2020-11-28 DIAGNOSIS — O99280 Endocrine, nutritional and metabolic diseases complicating pregnancy, unspecified trimester: Secondary | ICD-10-CM

## 2020-11-28 DIAGNOSIS — E039 Hypothyroidism, unspecified: Secondary | ICD-10-CM

## 2020-11-28 NOTE — Telephone Encounter (Signed)
Patient at [facility] needing labs drawn. Orders placed and faxed to [facility] [phone]. Kathrene Alu RN

## 2020-11-29 LAB — T4, FREE: Free T4: 1.17 ng/dL (ref 0.82–1.77)

## 2020-11-29 LAB — TSH: TSH: 8.41 u[IU]/mL — ABNORMAL HIGH (ref 0.450–4.500)

## 2020-11-29 LAB — T3, FREE: T3, Free: 2 pg/mL (ref 2.0–4.4)

## 2020-12-03 ENCOUNTER — Other Ambulatory Visit: Payer: Self-pay | Admitting: Obstetrics & Gynecology

## 2020-12-03 DIAGNOSIS — E039 Hypothyroidism, unspecified: Secondary | ICD-10-CM

## 2020-12-03 MED ORDER — LEVOTHYROXINE SODIUM 175 MCG PO TABS: 175.0000 ug | ORAL_TABLET | Freq: Every day | ORAL | 3 refills | Status: DC

## 2020-12-19 ENCOUNTER — Encounter: Payer: Commercial Managed Care - PPO | Admitting: Family Medicine

## 2021-01-02 ENCOUNTER — Encounter: Payer: Self-pay | Admitting: Obstetrics & Gynecology

## 2021-01-02 ENCOUNTER — Ambulatory Visit (INDEPENDENT_AMBULATORY_CARE_PROVIDER_SITE_OTHER): Payer: BLUE CROSS/BLUE SHIELD | Admitting: Obstetrics & Gynecology

## 2021-01-02 ENCOUNTER — Other Ambulatory Visit: Payer: Self-pay

## 2021-01-02 VITALS — BP 118/67 | HR 70 | Wt 216.0 lb

## 2021-01-02 DIAGNOSIS — O2390 Unspecified genitourinary tract infection in pregnancy, unspecified trimester: Secondary | ICD-10-CM

## 2021-01-02 DIAGNOSIS — O98519 Other viral diseases complicating pregnancy, unspecified trimester: Secondary | ICD-10-CM

## 2021-01-02 DIAGNOSIS — O09529 Supervision of elderly multigravida, unspecified trimester: Secondary | ICD-10-CM

## 2021-01-02 DIAGNOSIS — B009 Herpesviral infection, unspecified: Secondary | ICD-10-CM

## 2021-01-02 DIAGNOSIS — O99280 Endocrine, nutritional and metabolic diseases complicating pregnancy, unspecified trimester: Secondary | ICD-10-CM

## 2021-01-02 DIAGNOSIS — E039 Hypothyroidism, unspecified: Secondary | ICD-10-CM

## 2021-01-02 MED ORDER — VALACYCLOVIR HCL 500 MG PO TABS: 500.0000 mg | ORAL_TABLET | Freq: Two times a day (BID) | ORAL | 3 refills | Status: DC

## 2021-01-02 NOTE — Progress Notes (Signed)
   PRENATAL VISIT NOTE  Subjective:  Tina [name] is a 40 y.o. G3P1011 at 33w5dbeing seen today for ongoing prenatal care.  She is currently monitored for the following issues for this high-risk pregnancy and has Hypothyroidism in pregnancy, antepartum; Supervision of pregnancy for multigravida of advanced maternal age; and Genitourinary infection, antepartum on their problem list.  Patient reports  having issues with her sciatica. Pt reports that this pregnancy is physicially more taxing than her last pregnancy.    Contractions: Not present. Vag. Bleeding: None.  Movement: Present. Denies leaking of fluid.   The following portions of the patient's history were reviewed and updated as appropriate: allergies, current medications, past family history, past medical history, past social history, past surgical history and problem list.   Objective:   Vitals:   01/02/21 1606  BP: 118/67  Pulse: 70  Weight: 216 lb (98 kg)    Fetal Status: Fetal Heart Rate (bpm): 145   Movement: Present     General:  Alert, oriented and cooperative. Patient is in no acute distress.  Skin: Skin is warm and dry. No rash noted.   Cardiovascular: Normal heart rate noted  Respiratory: Normal respiratory effort, no problems with respiration noted  Abdomen: Soft, gravid, appropriate for gestational age.  Pain/Pressure: Present     Pelvic: Cervical exam deferred        Extremities: Normal range of motion.  Edema: None  Mental Status: Normal mood and affect. Normal behavior. Normal judgment and thought content.   Assessment and Plan:  Pregnancy: G3P1011 at 263w5d. Supervision of multigravida of advanced maternal age, antepartum FHR and FH  2. Hypothyroidism in pregnancy, antepartum Thyroid panel done.   3. Genitourinary infection, antepartum No outbreak since 2017   Preterm labor symptoms and general obstetric precautions including but not limited to vaginal bleeding, contractions, leaking of fluid and  fetal movement were reviewed in detail with the patient. Please refer to After Visit Summary for other counseling recommendations.   Return in about 4 weeks (around 01/30/2021).  Future Appointments  Date Time Provider [place]9/16/2022  7:45 AM [facility] NURSE [facility]  8:00 AM [facility]  8:15 AM HaLavonia DraftsMD [facility] None    CaLavonia DraftsMD

## 2021-01-04 ENCOUNTER — Ambulatory Visit: Payer: BLUE CROSS/BLUE SHIELD

## 2021-01-04 ENCOUNTER — Ambulatory Visit: Payer: Commercial Managed Care - PPO

## 2021-01-04 ENCOUNTER — Other Ambulatory Visit: Payer: Self-pay

## 2021-01-04 LAB — T4, FREE: Free T4: 1.39 ng/dL (ref 0.82–1.77)

## 2021-01-04 LAB — TSH: TSH: 1.28 u[IU]/mL (ref 0.450–4.500)

## 2021-01-04 LAB — T3, FREE: T3, Free: 2.3 pg/mL (ref 2.0–4.4)

## 2021-01-14 ENCOUNTER — Ambulatory Visit (HOSPITAL_BASED_OUTPATIENT_CLINIC_OR_DEPARTMENT_OTHER): Payer: BLUE CROSS/BLUE SHIELD

## 2021-01-14 ENCOUNTER — Other Ambulatory Visit: Payer: Self-pay

## 2021-01-14 ENCOUNTER — Ambulatory Visit: Payer: BLUE CROSS/BLUE SHIELD | Attending: Obstetrics and Gynecology | Admitting: *Deleted

## 2021-01-14 ENCOUNTER — Other Ambulatory Visit: Payer: Self-pay | Admitting: *Deleted

## 2021-01-14 VITALS — BP 109/71 | HR 76

## 2021-01-14 DIAGNOSIS — O09522 Supervision of elderly multigravida, second trimester: Secondary | ICD-10-CM | POA: Insufficient documentation

## 2021-01-14 DIAGNOSIS — Z3A26 26 weeks gestation of pregnancy: Secondary | ICD-10-CM | POA: Diagnosis not present

## 2021-01-14 DIAGNOSIS — E039 Hypothyroidism, unspecified: Secondary | ICD-10-CM

## 2021-01-14 DIAGNOSIS — Z79890 Hormone replacement therapy (postmenopausal): Secondary | ICD-10-CM | POA: Insufficient documentation

## 2021-01-14 DIAGNOSIS — E079 Disorder of thyroid, unspecified: Secondary | ICD-10-CM

## 2021-01-14 DIAGNOSIS — O09292 Supervision of pregnancy with other poor reproductive or obstetric history, second trimester: Secondary | ICD-10-CM | POA: Insufficient documentation

## 2021-01-14 DIAGNOSIS — O99280 Endocrine, nutritional and metabolic diseases complicating pregnancy, unspecified trimester: Secondary | ICD-10-CM

## 2021-01-14 DIAGNOSIS — O2390 Unspecified genitourinary tract infection in pregnancy, unspecified trimester: Secondary | ICD-10-CM

## 2021-01-14 DIAGNOSIS — O99282 Endocrine, nutritional and metabolic diseases complicating pregnancy, second trimester: Secondary | ICD-10-CM

## 2021-01-28 ENCOUNTER — Other Ambulatory Visit: Payer: Self-pay

## 2021-01-28 ENCOUNTER — Encounter: Payer: Self-pay | Admitting: Obstetrics & Gynecology

## 2021-01-28 ENCOUNTER — Ambulatory Visit (INDEPENDENT_AMBULATORY_CARE_PROVIDER_SITE_OTHER): Payer: BLUE CROSS/BLUE SHIELD | Admitting: Obstetrics & Gynecology

## 2021-01-28 VITALS — BP 96/58 | HR 73 | Wt 221.0 lb

## 2021-01-28 DIAGNOSIS — Z23 Encounter for immunization: Secondary | ICD-10-CM

## 2021-01-28 DIAGNOSIS — O09529 Supervision of elderly multigravida, unspecified trimester: Secondary | ICD-10-CM

## 2021-01-28 DIAGNOSIS — O09522 Supervision of elderly multigravida, second trimester: Secondary | ICD-10-CM

## 2021-01-28 DIAGNOSIS — Z3A28 28 weeks gestation of pregnancy: Secondary | ICD-10-CM

## 2021-01-28 NOTE — Progress Notes (Signed)
   PRENATAL VISIT NOTE  Subjective:  Tina [name] is a 40 y.o. G3P1011 at [redacted]w[redacted]d being seen today for ongoing prenatal care.  She is currently monitored for the following issues for this high-risk pregnancy and has Hypothyroidism in pregnancy, antepartum; Supervision of pregnancy for multigravida of advanced maternal age; and Genitourinary infection, antepartum on their problem list.  Patient reports  Pt reports pain in her hips and lower back that is greatest with activity. She also reports occ mild lower pelvic cramping with activity. Pt does not have a maternity belt .  Contractions: Not present. Vag. Bleeding: None.  Movement: Present. Denies leaking of fluid.   The following portions of the patient's history were reviewed and updated as appropriate: allergies, current medications, past family history, past medical history, past social history, past surgical history and problem list.   Objective:   Vitals:   01/28/21 0804  BP: (!) 96/58  Pulse: 73  Weight: 221 lb (100.2 kg)    Fetal Status: Fetal Heart Rate (bpm): 146 Fundal Height: 30 cm Movement: Present     General:  Alert, oriented and cooperative. Patient is in no acute distress.  Skin: Skin is warm and dry. No rash noted.   Cardiovascular: Normal heart rate noted  Respiratory: Normal respiratory effort, no problems with respiration noted  Abdomen: Soft, gravid, appropriate for gestational age.  Pain/Pressure: Absent     Pelvic: Cervical exam deferred        Extremities: Normal range of motion.  Edema: None  Mental Status: Normal mood and affect. Normal behavior. Normal judgment and thought content.   Assessment and Plan:  Pregnancy: G3P1011 at [redacted]w[redacted]d 1. [redacted] weeks gestation of pregnancy  - CBC - Glucose Tolerance, 2 Hours w/1 Hour - HIV Antibody (routine testing w rflx) - RPR - Tdap vaccine greater than or equal to 7yo IM - Flu Vaccine QUAD 27mo+IM (Fluarix, Fluzone & Alfiuria Quad PF)  2. Supervision of  multigravida of advanced maternal age, antepartum FH and FHR WNL - CBC - Glucose Tolerance, 2 Hours w/1 Hour - HIV Antibody (routine testing w rflx) - RPR - Tdap vaccine greater than or equal to 7yo IM - Flu Vaccine QUAD 45mo+IM (Fluarix, Fluzone & Alfiuria Quad PF)  3. Hypothyroidism TSH currently WNL Repeat TSH at next visit Keep current dose of meds.  Antenatal testing not indicated.   4. AMA 01/14/2021  Est. FW:    1046  gm      2 lb 5 oz     72  %    Preterm labor symptoms and general obstetric precautions including but not limited to vaginal bleeding, contractions, leaking of fluid and fetal movement were reviewed in detail with the patient. Please refer to After Visit Summary for other counseling recommendations.   Return in about 2 weeks (around 02/11/2021) for Web based, in person.  Future Appointments  Date Time Provider [place]  02/11/2021  8:35 AM [name], MD [facility] None  02/25/2021  8:15 AM [name], MD [facility] None  02/28/2021  7:45 AM [facility] NURSE [facility]  02/28/2021  8:00 AM [facility] US1 [facility]  03/11/2021  8:15 AM [name], MD [facility] None    [name], MD

## 2021-01-30 LAB — CBC
Hematocrit: 33.8 % — ABNORMAL LOW (ref 34.0–46.6)
Hemoglobin: 11.5 g/dL (ref 11.1–15.9)
MCH: 30.1 pg (ref 26.6–33.0)
MCHC: 34 g/dL (ref 31.5–35.7)
MCV: 89 fL (ref 79–97)
Platelets: 249 10*3/uL (ref 150–450)
RBC: 3.82 x10E6/uL (ref 3.77–5.28)
RDW: 12.7 % (ref 11.7–15.4)
WBC: 7.9 10*3/uL (ref 3.4–10.8)

## 2021-01-30 LAB — RPR: RPR Ser Ql: REACTIVE — AB

## 2021-01-30 LAB — GLUCOSE TOLERANCE, 2 HOURS W/ 1HR
Glucose, 1 hour: 121 mg/dL (ref 70–179)
Glucose, 2 hour: 92 mg/dL (ref 70–152)
Glucose, Fasting: 81 mg/dL (ref 70–91)

## 2021-01-30 LAB — RPR, QUANT+TP ABS (REFLEX)
Rapid Plasma Reagin, Quant: 1:1 {titer} — ABNORMAL HIGH
T Pallidum Abs: NONREACTIVE

## 2021-01-30 LAB — HIV ANTIBODY (ROUTINE TESTING W REFLEX): HIV Screen 4th Generation wRfx: NONREACTIVE

## 2021-02-11 ENCOUNTER — Encounter: Payer: Self-pay | Admitting: Obstetrics & Gynecology

## 2021-02-11 ENCOUNTER — Other Ambulatory Visit: Payer: Self-pay

## 2021-02-11 ENCOUNTER — Ambulatory Visit (INDEPENDENT_AMBULATORY_CARE_PROVIDER_SITE_OTHER): Payer: BLUE CROSS/BLUE SHIELD | Admitting: Obstetrics & Gynecology

## 2021-02-11 VITALS — BP 112/57 | HR 83 | Wt 223.0 lb

## 2021-02-11 DIAGNOSIS — E039 Hypothyroidism, unspecified: Secondary | ICD-10-CM

## 2021-02-11 DIAGNOSIS — O2390 Unspecified genitourinary tract infection in pregnancy, unspecified trimester: Secondary | ICD-10-CM

## 2021-02-11 DIAGNOSIS — O99280 Endocrine, nutritional and metabolic diseases complicating pregnancy, unspecified trimester: Secondary | ICD-10-CM

## 2021-02-11 DIAGNOSIS — O09529 Supervision of elderly multigravida, unspecified trimester: Secondary | ICD-10-CM

## 2021-02-11 NOTE — Progress Notes (Signed)
   PRENATAL VISIT NOTE  Subjective:  Tina [name] is a 40 y.o. G3P1011 at [redacted]w[redacted]d being seen today for ongoing prenatal care.  She is currently monitored for the following issues for this high-risk pregnancy and has Hypothyroidism in pregnancy, antepartum; Supervision of pregnancy for multigravida of advanced maternal age; and Genitourinary infection, antepartum on their problem list.  Patient reports  still reports hip pain that resolved with rest.   .  Contractions: Not present. Vag. Bleeding: None.  Movement: Present. Denies leaking of fluid.   The following portions of the patient's history were reviewed and updated as appropriate: allergies, current medications, past family history, past medical history, past social history, past surgical history and problem list.   Objective:   Vitals:   02/11/21 0821  BP: (!) 112/57  Pulse: 83  Weight: 223 lb (101.2 kg)    Fetal Status: Fetal Heart Rate (bpm): 140   Movement: Present     General:  Alert, oriented and cooperative. Patient is in no acute distress.  Skin: Skin is warm and dry. No rash noted.   Cardiovascular: Normal heart rate noted  Respiratory: Normal respiratory effort, no problems with respiration noted  Abdomen: Soft, gravid, appropriate for gestational age.  Pain/Pressure: Present     Pelvic: Cervical exam deferred        Extremities: Normal range of motion.  Edema: None  Mental Status: Normal mood and affect. Normal behavior. Normal judgment and thought content.   Assessment and Plan:  Pregnancy: G3P1011 at [redacted]w[redacted]d 1. Supervision of multigravida of advanced maternal age, antepartum FH and FHR WNL 01/14/2021  Est. FW:    1046  gm      2 lb 5 oz     72  % 2. Hypothyroid in pregnancy, antepartum Has been stable and WNL   - TSH  3. Genitourinary infection, antepartum On suppression.  Had an oral outbreak. ow no lesion since 2017  Preterm labor symptoms and general obstetric precautions including but not limited to  vaginal bleeding, contractions, leaking of fluid and fetal movement were reviewed in detail with the patient. Please refer to After Visit Summary for other counseling recommendations.   Return in about 2 weeks (around 02/25/2021) for in person.  Future Appointments  Date Time Provider [place]  02/25/2021  8:15 AM [name], MD [facility] None  02/28/2021  7:45 AM [facility] NURSE [facility]  02/28/2021  8:00 AM [facility] US1 [facility]  03/11/2021  8:15 AM [name], MD [facility] None    [name], MD

## 2021-02-12 LAB — TSH: TSH: 1.1 u[IU]/mL (ref 0.450–4.500)

## 2021-02-25 ENCOUNTER — Other Ambulatory Visit: Payer: Self-pay

## 2021-02-25 ENCOUNTER — Encounter: Payer: Self-pay | Admitting: Obstetrics & Gynecology

## 2021-02-25 ENCOUNTER — Ambulatory Visit (INDEPENDENT_AMBULATORY_CARE_PROVIDER_SITE_OTHER): Payer: BLUE CROSS/BLUE SHIELD | Admitting: Obstetrics & Gynecology

## 2021-02-25 VITALS — BP 101/61 | HR 86 | Wt 225.0 lb

## 2021-02-25 DIAGNOSIS — E039 Hypothyroidism, unspecified: Secondary | ICD-10-CM

## 2021-02-25 DIAGNOSIS — O99280 Endocrine, nutritional and metabolic diseases complicating pregnancy, unspecified trimester: Secondary | ICD-10-CM

## 2021-02-25 DIAGNOSIS — O09529 Supervision of elderly multigravida, unspecified trimester: Secondary | ICD-10-CM

## 2021-02-25 DIAGNOSIS — O2390 Unspecified genitourinary tract infection in pregnancy, unspecified trimester: Secondary | ICD-10-CM

## 2021-02-25 DIAGNOSIS — Z3A32 32 weeks gestation of pregnancy: Secondary | ICD-10-CM

## 2021-02-25 MED ORDER — LEVOTHYROXINE SODIUM 175 MCG PO TABS: 175.0000 ug | ORAL_TABLET | Freq: Every day | ORAL | 3 refills | Status: DC

## 2021-02-25 NOTE — Progress Notes (Signed)
   PRENATAL VISIT NOTE  Subjective:  Tina [name] is a 40 y.o. G3P1011 at [redacted]w[redacted]d being seen today for ongoing prenatal care.  She is currently monitored for the following issues for this high-risk pregnancy and has Hypothyroidism in pregnancy, antepartum; Supervision of pregnancy for multigravida of advanced maternal age; and Genitourinary infection, antepartum on their problem list.  Patient reports  improvement of her back pain.  .  Occ ctx. Vag. Bleeding: None.  Movement: Present. Denies leaking of fluid.   The following portions of the patient's history were reviewed and updated as appropriate: allergies, current medications, past family history, past medical history, past social history, past surgical history and problem list.   Objective:   Vitals:   02/25/21 0814  BP: 101/61  Pulse: 86  Weight: 225 lb (102.1 kg)    Fetal Status: Fetal Heart Rate (bpm): 139   Movement: Present     General:  Alert, oriented and cooperative. Patient is in no acute distress.  Skin: Skin is warm and dry. No rash noted.   Cardiovascular: Normal heart rate noted  Respiratory: Normal respiratory effort, no problems with respiration noted  Abdomen: Soft, gravid, appropriate for gestational age.  Pain/Pressure: Present     Pelvic: Cervical exam deferred        Extremities: Normal range of motion.  Edema: None  Mental Status: Normal mood and affect. Normal behavior. Normal judgment and thought content.   Assessment and Plan:  Pregnancy: G3P1011 at [redacted]w[redacted]d 1. [redacted] weeks gestation of pregnancy FH and FHR WNL  2. Supervision of multigravida of advanced maternal age, antepartum  3. Genitourinary infection, antepartum On suppression  4. Hypothyroidism in pregnancy, antepartum WNL  Preterm labor symptoms and general obstetric precautions including but not limited to vaginal bleeding, contractions, leaking of fluid and fetal movement were reviewed in detail with the patient. Please refer to After  Visit Summary for other counseling recommendations.   No follow-ups on file.  Future Appointments  Date Time Provider [place]  02/28/2021  7:45 AM [facility] NURSE [facility]  02/28/2021  8:00 AM [facility] US1 [facility]  03/11/2021  8:15 AM [name], MD [facility] None  03/25/2021  8:55 AM [name], MD [facility] None  04/01/2021  8:15 AM [name], MD [facility] None  04/08/2021  8:15 AM [name], MD [facility] None  04/17/2021  4:10 PM [name], MD [facility] None    [name], MD

## 2021-02-28 ENCOUNTER — Ambulatory Visit: Payer: BLUE CROSS/BLUE SHIELD | Admitting: *Deleted

## 2021-02-28 ENCOUNTER — Other Ambulatory Visit: Payer: Self-pay | Admitting: *Deleted

## 2021-02-28 ENCOUNTER — Other Ambulatory Visit: Payer: Self-pay

## 2021-02-28 ENCOUNTER — Ambulatory Visit: Payer: BLUE CROSS/BLUE SHIELD | Attending: Obstetrics and Gynecology

## 2021-02-28 VITALS — BP 119/68 | HR 79

## 2021-02-28 DIAGNOSIS — O09522 Supervision of elderly multigravida, second trimester: Secondary | ICD-10-CM | POA: Diagnosis present

## 2021-02-28 DIAGNOSIS — Z3A32 32 weeks gestation of pregnancy: Secondary | ICD-10-CM | POA: Diagnosis not present

## 2021-02-28 DIAGNOSIS — O99280 Endocrine, nutritional and metabolic diseases complicating pregnancy, unspecified trimester: Secondary | ICD-10-CM | POA: Insufficient documentation

## 2021-02-28 DIAGNOSIS — O2390 Unspecified genitourinary tract infection in pregnancy, unspecified trimester: Secondary | ICD-10-CM

## 2021-02-28 DIAGNOSIS — O09529 Supervision of elderly multigravida, unspecified trimester: Secondary | ICD-10-CM | POA: Diagnosis present

## 2021-02-28 DIAGNOSIS — O09523 Supervision of elderly multigravida, third trimester: Secondary | ICD-10-CM

## 2021-02-28 DIAGNOSIS — O99283 Endocrine, nutritional and metabolic diseases complicating pregnancy, third trimester: Secondary | ICD-10-CM

## 2021-02-28 DIAGNOSIS — E039 Hypothyroidism, unspecified: Secondary | ICD-10-CM | POA: Diagnosis not present

## 2021-02-28 NOTE — Progress Notes (Unsigned)
Us/

## 2021-03-11 ENCOUNTER — Encounter: Payer: Self-pay | Admitting: Obstetrics & Gynecology

## 2021-03-11 ENCOUNTER — Ambulatory Visit (INDEPENDENT_AMBULATORY_CARE_PROVIDER_SITE_OTHER): Payer: BLUE CROSS/BLUE SHIELD | Admitting: Obstetrics & Gynecology

## 2021-03-11 ENCOUNTER — Other Ambulatory Visit: Payer: Self-pay

## 2021-03-11 VITALS — BP 102/72 | HR 85 | Wt 230.0 lb

## 2021-03-11 DIAGNOSIS — E039 Hypothyroidism, unspecified: Secondary | ICD-10-CM

## 2021-03-11 DIAGNOSIS — O99280 Endocrine, nutritional and metabolic diseases complicating pregnancy, unspecified trimester: Secondary | ICD-10-CM

## 2021-03-11 DIAGNOSIS — O2390 Unspecified genitourinary tract infection in pregnancy, unspecified trimester: Secondary | ICD-10-CM

## 2021-03-11 DIAGNOSIS — O09529 Supervision of elderly multigravida, unspecified trimester: Secondary | ICD-10-CM

## 2021-03-11 NOTE — Progress Notes (Signed)
   PRENATAL VISIT NOTE  Subjective:  Tina [name] is a 40 y.o. G3P1011 at [redacted]w[redacted]d being seen today for ongoing prenatal care.  She is currently monitored for the following issues for this high-risk pregnancy and has Hypothyroidism in pregnancy, antepartum; Supervision of pregnancy for multigravida of advanced maternal age; and Genitourinary infection, antepartum on their problem list.  Patient reports no complaints.  Contractions: Irregular. Vag. Bleeding: None.  Movement: Present. Denies leaking of fluid.   The following portions of the patient's history were reviewed and updated as appropriate: allergies, current medications, past family history, past medical history, past social history, past surgical history and problem list.   Objective:   Vitals:   03/11/21 0803  BP: 102/72  Pulse: 85  Weight: 230 lb (104.3 kg)    Fetal Status: Fetal Heart Rate (bpm): 145   Movement: Present     General:  Alert, oriented and cooperative. Patient is in no acute distress.  Skin: Skin is warm and dry. No rash noted.   Cardiovascular: Normal heart rate noted  Respiratory: Normal respiratory effort, no problems with respiration noted  Abdomen: Soft, gravid, appropriate for gestational age.  Pain/Pressure: Absent     Pelvic: Cervical exam deferred        Extremities: Normal range of motion.  Edema: None  Mental Status: Normal mood and affect. Normal behavior. Normal judgment and thought content.   Assessment and Plan:  Pregnancy: G3P1011 at [redacted]w[redacted]d 1. Supervision of multigravida of advanced maternal age, antepartum FHR and FH WNL Discussed delivery plan. Need to readdress on next visit.   2. Hypothyroid in pregnancy, antepartum No sx. Stable  3. Genitourinary infection, antepartum On suppression  Preterm labor symptoms and general obstetric precautions including but not limited to vaginal bleeding, contractions, leaking of fluid and fetal movement were reviewed in detail with the  patient. Please refer to After Visit Summary for other counseling recommendations.   Return in about 2 weeks (around 03/25/2021) for in person.  Future Appointments  Date Time Provider [place]  03/25/2021  8:55 AM [name], MD [facility] None  03/28/2021  7:30 AM [facility] NURSE [facility]  03/28/2021  7:45 AM [facility] US5 [facility]  04/01/2021  8:15 AM [name], MD [facility] None  04/10/2021  4:10 PM [name], MD [facility] None  04/17/2021  8:35 AM Nehemiah Settle Tanna Savoy, DO [facility] None    [name], MD

## 2021-03-18 ENCOUNTER — Encounter: Payer: Self-pay | Admitting: General Practice

## 2021-03-25 ENCOUNTER — Other Ambulatory Visit (HOSPITAL_COMMUNITY)
Admission: RE | Admit: 2021-03-25 | Discharge: 2021-03-25 | Disposition: A | Discharge status: Home / Self Care | Payer: BLUE CROSS/BLUE SHIELD | Source: Ambulatory Visit | Attending: Obstetrics & Gynecology | Admitting: Obstetrics & Gynecology

## 2021-03-25 ENCOUNTER — Encounter: Payer: Self-pay | Admitting: Obstetrics & Gynecology

## 2021-03-25 ENCOUNTER — Other Ambulatory Visit: Payer: Self-pay

## 2021-03-25 ENCOUNTER — Ambulatory Visit (INDEPENDENT_AMBULATORY_CARE_PROVIDER_SITE_OTHER): Payer: BLUE CROSS/BLUE SHIELD | Admitting: Obstetrics & Gynecology

## 2021-03-25 VITALS — BP 116/66 | HR 87 | Wt 238.0 lb

## 2021-03-25 DIAGNOSIS — Z3A36 36 weeks gestation of pregnancy: Secondary | ICD-10-CM | POA: Diagnosis present

## 2021-03-25 DIAGNOSIS — O09523 Supervision of elderly multigravida, third trimester: Secondary | ICD-10-CM

## 2021-03-25 DIAGNOSIS — E039 Hypothyroidism, unspecified: Secondary | ICD-10-CM

## 2021-03-25 DIAGNOSIS — O99280 Endocrine, nutritional and metabolic diseases complicating pregnancy, unspecified trimester: Secondary | ICD-10-CM

## 2021-03-25 DIAGNOSIS — O2390 Unspecified genitourinary tract infection in pregnancy, unspecified trimester: Secondary | ICD-10-CM

## 2021-03-25 LAB — OB RESULTS CONSOLE GC/CHLAMYDIA: Gonorrhea: NEGATIVE

## 2021-03-25 NOTE — Progress Notes (Signed)
   PRENATAL VISIT NOTE  Subjective:  Tina [name] is a 40 y.o. G3P1011 at [redacted]w[redacted]d being seen today for ongoing prenatal care.  She is currently monitored for the following issues for this high-risk pregnancy and has Hypothyroidism in pregnancy, antepartum; Supervision of pregnancy for multigravida of advanced maternal age; and Genitourinary infection, antepartum on their problem list.  Patient reports backache, occasional contractions, and generalized discomfort .  Contractions: Irregular. Vag. Bleeding: None.  Movement: Present. Denies leaking of fluid.   The following portions of the patient's history were reviewed and updated as appropriate: allergies, current medications, past family history, past medical history, past social history, past surgical history and problem list.   Objective:   Vitals:   03/25/21 0831  BP: 116/66  Pulse: 87  Weight: 238 lb (108 kg)    Fetal Status:     Movement: Present     General:  Alert, oriented and cooperative. Patient is in no acute distress.  Skin: Skin is warm and dry. No rash noted.   Cardiovascular: Normal heart rate noted  Respiratory: Normal respiratory effort, no problems with respiration noted  Abdomen: Soft, gravid, appropriate for gestational age.  Pain/Pressure: Absent     Pelvic: Cervical exam performed in the presence of a chaperone        Extremities: Normal range of motion.  Edema: None  Mental Status: Normal mood and affect. Normal behavior. Normal judgment and thought content.   Assessment and Plan:  Pregnancy: G3P1011 at [redacted]w[redacted]d 1. Encounter for supervision of multigravida of advanced maternal age in third trimester FH and FHR wnl  Pt has started colostrum expression 2 days ago  2. Genitourinary infection, antepartum On suppression  3. Hypothyroidism in pregnancy, antepartum Stable  Has growth [place] in 3 days  Preterm labor symptoms and general obstetric precautions including but not limited to vaginal bleeding,  contractions, leaking of fluid and fetal movement were reviewed in detail with the patient. Please refer to After Visit Summary for other counseling recommendations.   Return in about 1 week (around 04/01/2021).  Future Appointments  Date Time Provider [place]  03/25/2021  8:55 AM [name], MD [facility] None  03/28/2021  7:30 AM [facility] NURSE [facility]  03/28/2021  7:45 AM [facility] US5 [facility]  04/01/2021  8:15 AM [name], MD [facility] None  04/10/2021  4:10 PM [name], MD [facility] None  04/17/2021  8:35 AM Nehemiah Settle Tanna Savoy, DO [facility] None    [name], MD

## 2021-03-26 LAB — GC/CHLAMYDIA PROBE AMP (~~LOC~~) NOT AT ARMC
Chlamydia: NEGATIVE
Comment: NEGATIVE
Comment: NORMAL
Neisseria Gonorrhea: NEGATIVE

## 2021-03-28 ENCOUNTER — Ambulatory Visit: Payer: BLUE CROSS/BLUE SHIELD | Admitting: *Deleted

## 2021-03-28 ENCOUNTER — Other Ambulatory Visit: Payer: Self-pay

## 2021-03-28 ENCOUNTER — Ambulatory Visit: Payer: BLUE CROSS/BLUE SHIELD | Attending: Obstetrics and Gynecology

## 2021-03-28 VITALS — BP 109/80 | HR 84

## 2021-03-28 DIAGNOSIS — O99283 Endocrine, nutritional and metabolic diseases complicating pregnancy, third trimester: Secondary | ICD-10-CM

## 2021-03-28 DIAGNOSIS — E669 Obesity, unspecified: Secondary | ICD-10-CM

## 2021-03-28 DIAGNOSIS — O99280 Endocrine, nutritional and metabolic diseases complicating pregnancy, unspecified trimester: Secondary | ICD-10-CM | POA: Insufficient documentation

## 2021-03-28 DIAGNOSIS — O09523 Supervision of elderly multigravida, third trimester: Secondary | ICD-10-CM

## 2021-03-28 DIAGNOSIS — O99213 Obesity complicating pregnancy, third trimester: Secondary | ICD-10-CM

## 2021-03-28 DIAGNOSIS — Z3A36 36 weeks gestation of pregnancy: Secondary | ICD-10-CM

## 2021-03-28 DIAGNOSIS — O2390 Unspecified genitourinary tract infection in pregnancy, unspecified trimester: Secondary | ICD-10-CM | POA: Diagnosis present

## 2021-03-28 DIAGNOSIS — E039 Hypothyroidism, unspecified: Secondary | ICD-10-CM | POA: Diagnosis present

## 2021-03-29 LAB — CULTURE, BETA STREP (GROUP B ONLY): Strep Gp B Culture: NEGATIVE

## 2021-04-01 ENCOUNTER — Other Ambulatory Visit: Payer: Self-pay

## 2021-04-01 ENCOUNTER — Ambulatory Visit (INDEPENDENT_AMBULATORY_CARE_PROVIDER_SITE_OTHER): Payer: BLUE CROSS/BLUE SHIELD | Admitting: Obstetrics & Gynecology

## 2021-04-01 VITALS — BP 107/61 | HR 75 | Wt 237.0 lb

## 2021-04-01 DIAGNOSIS — O99280 Endocrine, nutritional and metabolic diseases complicating pregnancy, unspecified trimester: Secondary | ICD-10-CM

## 2021-04-01 DIAGNOSIS — O09529 Supervision of elderly multigravida, unspecified trimester: Secondary | ICD-10-CM

## 2021-04-01 DIAGNOSIS — E039 Hypothyroidism, unspecified: Secondary | ICD-10-CM

## 2021-04-01 DIAGNOSIS — Z3A37 37 weeks gestation of pregnancy: Secondary | ICD-10-CM

## 2021-04-01 DIAGNOSIS — O2390 Unspecified genitourinary tract infection in pregnancy, unspecified trimester: Secondary | ICD-10-CM

## 2021-04-01 NOTE — Progress Notes (Signed)
   PRENATAL VISIT NOTE  Subjective:  Tina [name] is a 40 y.o. G3P1011 at [redacted]w[redacted]d being seen today for ongoing prenatal care.  She is currently monitored for the following issues for this high-risk pregnancy and has Hypothyroidism in pregnancy, antepartum; Supervision of pregnancy for multigravida of advanced maternal age; and Genitourinary infection, antepartum on their problem list.  Patient reports occasional contractions.  Contractions: Irregular. Vag. Bleeding: None.  Movement: Present. Denies leaking of fluid.   The following portions of the patient's history were reviewed and updated as appropriate: allergies, current medications, past family history, past medical history, past social history, past surgical history and problem list.   Objective:   Vitals:   04/01/21 0816  BP: 107/61  Pulse: 75  Weight: 237 lb (107.5 kg)    Fetal Status: Fetal Heart Rate (bpm): 142   Movement: Present     General:  Alert, oriented and cooperative. Patient is in no acute distress.  Skin: Skin is warm and dry. No rash noted.   Cardiovascular: Normal heart rate noted  Respiratory: Normal respiratory effort, no problems with respiration noted  Abdomen: Soft, gravid, appropriate for gestational age.  Pain/Pressure: Present     Pelvic: Cervical exam deferred        Extremities: Normal range of motion.  Edema: None  Mental Status: Normal mood and affect. Normal behavior. Normal judgment and thought content.   Assessment and Plan:  Pregnancy: G3P1011 at [redacted]w[redacted]d 1. [redacted] weeks gestation of pregnancy  2. Supervision of multigravida of advanced maternal age, antepartum FHR and FH both WNL For IOL on 12/29  3. Genitourinary infection, antepartum On suppression  4. Hypothyroidism in pregnancy, antepartum stable  Term labor symptoms and general obstetric precautions including but not limited to vaginal bleeding, contractions, leaking of fluid and fetal movement were reviewed in detail with the  patient. Please refer to After Visit Summary for other counseling recommendations.   Return in about 1 week (around 04/08/2021).  Future Appointments  Date Time Provider [place]  04/10/2021  4:10 PM [name], MD [facility] None  04/17/2021  8:35 AM Nehemiah Settle, Tanna Savoy, DO [facility] None    [name], MD

## 2021-04-08 ENCOUNTER — Encounter: Payer: BLUE CROSS/BLUE SHIELD | Admitting: Obstetrics & Gynecology

## 2021-04-10 ENCOUNTER — Other Ambulatory Visit: Payer: Self-pay

## 2021-04-10 ENCOUNTER — Ambulatory Visit (INDEPENDENT_AMBULATORY_CARE_PROVIDER_SITE_OTHER): Payer: BLUE CROSS/BLUE SHIELD | Admitting: Obstetrics & Gynecology

## 2021-04-10 VITALS — BP 117/75 | HR 71 | Wt 237.0 lb

## 2021-04-10 DIAGNOSIS — E039 Hypothyroidism, unspecified: Secondary | ICD-10-CM

## 2021-04-10 DIAGNOSIS — Z3A38 38 weeks gestation of pregnancy: Secondary | ICD-10-CM

## 2021-04-10 DIAGNOSIS — O2390 Unspecified genitourinary tract infection in pregnancy, unspecified trimester: Secondary | ICD-10-CM

## 2021-04-10 DIAGNOSIS — O99280 Endocrine, nutritional and metabolic diseases complicating pregnancy, unspecified trimester: Secondary | ICD-10-CM

## 2021-04-10 DIAGNOSIS — O09529 Supervision of elderly multigravida, unspecified trimester: Secondary | ICD-10-CM

## 2021-04-10 NOTE — Progress Notes (Signed)
° °  PRENATAL VISIT NOTE  Subjective:  Tina [name] is a 40 y.o. G3P1011 at [redacted]w[redacted]d being seen today for ongoing prenatal care.  She is currently monitored for the following issues for this high-risk pregnancy and has Hypothyroidism in pregnancy, antepartum; Supervision of pregnancy for multigravida of advanced maternal age; and Genitourinary infection, antepartum on their problem list.  Patient reports occasional contractions.  Contractions: Irregular. Vag. Bleeding: None.  Movement: Present. Denies leaking of fluid.   The following portions of the patient's history were reviewed and updated as appropriate: allergies, current medications, past family history, past medical history, past social history, past surgical history and problem list.   Objective:   Vitals:   04/10/21 1553  BP: 117/75  Pulse: 71  Weight: 237 lb (107.5 kg)    Fetal Status: Fetal Heart Rate (bpm): 145   Movement: Present     General:  Alert, oriented and cooperative. Patient is in no acute distress.  Skin: Skin is warm and dry. No rash noted.   Cardiovascular: Normal heart rate noted  Respiratory: Normal respiratory effort, no problems with respiration noted  Abdomen: Soft, gravid, appropriate for gestational age.  Pain/Pressure: Present     Pelvic: Cervical exam performed in the presence of a chaperone        Extremities: Normal range of motion.  Edema: None  Mental Status: Normal mood and affect. Normal behavior. Normal judgment and thought content.   Assessment and Plan:  Pregnancy: G3P1011 at [redacted]w[redacted]d 1. [redacted] weeks gestation of pregnancy Fh and FHR WNL  2. Supervision of multigravida of advanced maternal age, antepartum For IOL 12/29  3. Genitourinary infection, antepartum On suppression  4. Hypothyroidism in pregnancy, antepartum stable  Term labor symptoms and general obstetric precautions including but not limited to vaginal bleeding, contractions, leaking of fluid and fetal movement were reviewed  in detail with the patient. Please refer to After Visit Summary for other counseling recommendations.   Return in about 1 week (around 04/17/2021).  Future Appointments  Date Time Provider [place]  04/17/2021  8:35 AM Truett Mainland, DO [facility] None  04/18/2021  7:45 AM [facility] SCHED ROOM [facility] None    [name], MD

## 2021-04-11 ENCOUNTER — Encounter (HOSPITAL_COMMUNITY): Payer: Self-pay | Admitting: *Deleted

## 2021-04-11 ENCOUNTER — Telehealth (HOSPITAL_COMMUNITY): Payer: Self-pay | Admitting: *Deleted

## 2021-04-11 NOTE — Telephone Encounter (Signed)
Preadmission screen  

## 2021-04-17 ENCOUNTER — Encounter: Payer: BLUE CROSS/BLUE SHIELD | Admitting: Obstetrics & Gynecology

## 2021-04-17 ENCOUNTER — Ambulatory Visit (INDEPENDENT_AMBULATORY_CARE_PROVIDER_SITE_OTHER): Payer: BLUE CROSS/BLUE SHIELD | Admitting: Family Medicine

## 2021-04-17 ENCOUNTER — Other Ambulatory Visit: Payer: Self-pay

## 2021-04-17 VITALS — BP 119/65 | HR 81 | Wt 239.0 lb

## 2021-04-17 DIAGNOSIS — Z3A39 39 weeks gestation of pregnancy: Secondary | ICD-10-CM

## 2021-04-17 DIAGNOSIS — E039 Hypothyroidism, unspecified: Secondary | ICD-10-CM

## 2021-04-17 DIAGNOSIS — O99280 Endocrine, nutritional and metabolic diseases complicating pregnancy, unspecified trimester: Secondary | ICD-10-CM

## 2021-04-17 DIAGNOSIS — O2390 Unspecified genitourinary tract infection in pregnancy, unspecified trimester: Secondary | ICD-10-CM

## 2021-04-17 DIAGNOSIS — O09529 Supervision of elderly multigravida, unspecified trimester: Secondary | ICD-10-CM

## 2021-04-17 NOTE — Progress Notes (Signed)
° °  PRENATAL VISIT NOTE  Subjective:  Tina [name] is a 40 y.o. G3P1011 at [redacted]w[redacted]d being seen today for ongoing prenatal care.  She is currently monitored for the following issues for this high-risk pregnancy and has Hypothyroidism in pregnancy, antepartum; Supervision of pregnancy for multigravida of advanced maternal age; and Genitourinary infection, antepartum on their problem list.  Patient reports no complaints.  Contractions: Irregular. Vag. Bleeding: None.  Movement: (!) Decreased. Denies leaking of fluid.   The following portions of the patient's history were reviewed and updated as appropriate: allergies, current medications, past family history, past medical history, past social history, past surgical history and problem list.   Objective:   Vitals:   04/17/21 0835  BP: 119/65  Pulse: 81  Weight: 239 lb (108.4 kg)    Fetal Status: Fetal Heart Rate (bpm): 133 Fundal Height: 38 cm Movement: (!) Decreased  Presentation: Vertex  General:  Alert, oriented and cooperative. Patient is in no acute distress.  Skin: Skin is warm and dry. No rash noted.   Cardiovascular: Normal heart rate noted  Respiratory: Normal respiratory effort, no problems with respiration noted  Abdomen: Soft, gravid, appropriate for gestational age.  Pain/Pressure: Present     Pelvic: Cervical exam deferred        Extremities: Normal range of motion.  Edema: None  Mental Status: Normal mood and affect. Normal behavior. Normal judgment and thought content.   Assessment and Plan:  Pregnancy: G3P1011 at [redacted]w[redacted]d 1. [redacted] weeks gestation of pregnancy  2. Supervision of multigravida of advanced maternal age, antepartum FHT appropriate for dates.  Decreased movement - NST today. 2 accelerations. Mod variability. Being induced tomorrow. If fetal movement decreases more, advised to go to the hospital   3. Hypothyroid in pregnancy, antepartum TSH last check 10/24 - normal  4. Genitourinary infection,  antepartum On suppression  Term labor symptoms and general obstetric precautions including but not limited to vaginal bleeding, contractions, leaking of fluid and fetal movement were reviewed in detail with the patient. Please refer to After Visit Summary for other counseling recommendations.   No follow-ups on file.  Future Appointments  Date Time Provider [place]  04/18/2021  7:45 AM [facility] SCHED ROOM [facility] None    Truett Mainland, DO

## 2021-04-18 ENCOUNTER — Encounter (HOSPITAL_COMMUNITY): Payer: Self-pay | Admitting: Obstetrics and Gynecology

## 2021-04-18 ENCOUNTER — Other Ambulatory Visit: Payer: Self-pay

## 2021-04-18 ENCOUNTER — Inpatient Hospital Stay (HOSPITAL_COMMUNITY): Payer: BLUE CROSS/BLUE SHIELD

## 2021-04-18 ENCOUNTER — Inpatient Hospital Stay (HOSPITAL_COMMUNITY)
Admission: AD | Admit: 2021-04-18 | Discharge: 2021-04-20 | DRG: 807 | Disposition: A | Discharge status: Home / Self Care | Payer: BLUE CROSS/BLUE SHIELD | Attending: Family Medicine | Admitting: Family Medicine

## 2021-04-18 DIAGNOSIS — Z880 Allergy status to penicillin: Secondary | ICD-10-CM | POA: Diagnosis not present

## 2021-04-18 DIAGNOSIS — O2390 Unspecified genitourinary tract infection in pregnancy, unspecified trimester: Secondary | ICD-10-CM

## 2021-04-18 DIAGNOSIS — O99280 Endocrine, nutritional and metabolic diseases complicating pregnancy, unspecified trimester: Secondary | ICD-10-CM | POA: Diagnosis present

## 2021-04-18 DIAGNOSIS — Z3490 Encounter for supervision of normal pregnancy, unspecified, unspecified trimester: Secondary | ICD-10-CM | POA: Diagnosis present

## 2021-04-18 DIAGNOSIS — O26893 Other specified pregnancy related conditions, third trimester: Secondary | ICD-10-CM | POA: Diagnosis present

## 2021-04-18 DIAGNOSIS — E039 Hypothyroidism, unspecified: Secondary | ICD-10-CM | POA: Diagnosis present

## 2021-04-18 DIAGNOSIS — Z3A39 39 weeks gestation of pregnancy: Secondary | ICD-10-CM | POA: Diagnosis not present

## 2021-04-18 DIAGNOSIS — O9902 Anemia complicating childbirth: Secondary | ICD-10-CM | POA: Diagnosis present

## 2021-04-18 DIAGNOSIS — O99284 Endocrine, nutritional and metabolic diseases complicating childbirth: Secondary | ICD-10-CM | POA: Diagnosis present

## 2021-04-18 DIAGNOSIS — Z7982 Long term (current) use of aspirin: Secondary | ICD-10-CM | POA: Diagnosis not present

## 2021-04-18 DIAGNOSIS — O09529 Supervision of elderly multigravida, unspecified trimester: Secondary | ICD-10-CM

## 2021-04-18 LAB — TYPE AND SCREEN
ABO/RH(D): A POS
Antibody Screen: NEGATIVE

## 2021-04-18 LAB — CBC
HCT: 34.4 % — ABNORMAL LOW (ref 36.0–46.0)
Hemoglobin: 11.4 g/dL — ABNORMAL LOW (ref 12.0–15.0)
MCH: 29.2 pg (ref 26.0–34.0)
MCHC: 33.1 g/dL (ref 30.0–36.0)
MCV: 88 fL (ref 80.0–100.0)
Platelets: 318 10*3/uL (ref 150–400)
RBC: 3.91 MIL/uL (ref 3.87–5.11)
RDW: 13.3 % (ref 11.5–15.5)
WBC: 10.4 10*3/uL (ref 4.0–10.5)
nRBC: 0 % (ref 0.0–0.2)

## 2021-04-18 MED ORDER — DIPHENHYDRAMINE HCL 50 MG/ML IJ SOLN: 12.5000 mg | INTRAMUSCULAR | Status: DC | PRN

## 2021-04-18 MED ORDER — OXYCODONE-ACETAMINOPHEN 5-325 MG PO TABS: 1.0000 | ORAL_TABLET | ORAL | Status: DC | PRN

## 2021-04-18 MED ORDER — OXYTOCIN BOLUS FROM INFUSION
333.0000 mL | Freq: Once | INTRAVENOUS | Status: AC
  Administered 2021-04-19: 05:00:00 333 mL via INTRAVENOUS

## 2021-04-18 MED ORDER — EPHEDRINE 5 MG/ML INJ
10.0000 mg | INTRAVENOUS | Status: AC | PRN
  Administered 2021-04-19 (×2): 10 mg via INTRAVENOUS

## 2021-04-18 MED ORDER — FLEET ENEMA 7-19 GM/118ML RE ENEM: 1.0000 | ENEMA | RECTAL | Status: DC | PRN

## 2021-04-18 MED ORDER — OXYTOCIN-SODIUM CHLORIDE 30-0.9 UT/500ML-% IV SOLN: 2.5000 [IU]/h | INTRAVENOUS | Status: DC

## 2021-04-18 MED ORDER — LIDOCAINE HCL (PF) 1 % IJ SOLN: 30.0000 mL | INTRAMUSCULAR | Status: DC | PRN

## 2021-04-18 MED ORDER — ONDANSETRON HCL 4 MG/2ML IJ SOLN: 4.0000 mg | Freq: Four times a day (QID) | INTRAMUSCULAR | Status: DC | PRN

## 2021-04-18 MED ORDER — TERBUTALINE SULFATE 1 MG/ML IJ SOLN
0.2500 mg | Freq: Once | INTRAMUSCULAR | Status: AC | PRN
  Administered 2021-04-19: 01:00:00 0.25 mg via SUBCUTANEOUS
  Filled 2021-04-18: qty 1

## 2021-04-18 MED ORDER — PHENYLEPHRINE 40 MCG/ML (10ML) SYRINGE FOR IV PUSH (FOR BLOOD PRESSURE SUPPORT)
80.0000 ug | PREFILLED_SYRINGE | INTRAVENOUS | Status: DC | PRN
  Filled 2021-04-18: qty 10

## 2021-04-18 MED ORDER — FENTANYL-BUPIVACAINE-NACL 0.5-0.125-0.9 MG/250ML-% EP SOLN
12.0000 mL/h | EPIDURAL | Status: DC | PRN
  Administered 2021-04-19: 12 mL/h via EPIDURAL
  Filled 2021-04-18: qty 250

## 2021-04-18 MED ORDER — FENTANYL CITRATE (PF) 100 MCG/2ML IJ SOLN
50.0000 ug | INTRAMUSCULAR | Status: DC | PRN
Start: 2021-04-18 — End: 2021-04-19

## 2021-04-18 MED ORDER — LACTATED RINGERS IV SOLN
500.0000 mL | Freq: Once | INTRAVENOUS | Status: AC
  Administered 2021-04-18: 500 mL via INTRAVENOUS

## 2021-04-18 MED ORDER — OXYTOCIN-SODIUM CHLORIDE 30-0.9 UT/500ML-% IV SOLN
1.0000 m[IU]/min | INTRAVENOUS | Status: DC
  Administered 2021-04-18 – 2021-04-19 (×2): 2 m[IU]/min via INTRAVENOUS
  Filled 2021-04-18: qty 500

## 2021-04-18 MED ORDER — PHENYLEPHRINE 40 MCG/ML (10ML) SYRINGE FOR IV PUSH (FOR BLOOD PRESSURE SUPPORT)
80.0000 ug | PREFILLED_SYRINGE | INTRAVENOUS | Status: DC | PRN
  Administered 2021-04-19 (×2): 80 ug via INTRAVENOUS

## 2021-04-18 MED ORDER — EPHEDRINE 5 MG/ML INJ
10.0000 mg | INTRAVENOUS | Status: DC | PRN
  Filled 2021-04-18 (×2): qty 5

## 2021-04-18 MED ORDER — SOD CITRATE-CITRIC ACID 500-334 MG/5ML PO SOLN: 30.0000 mL | ORAL | Status: DC | PRN

## 2021-04-18 MED ORDER — ACETAMINOPHEN 325 MG PO TABS: 650.0000 mg | ORAL_TABLET | ORAL | Status: DC | PRN

## 2021-04-18 MED ORDER — OXYCODONE-ACETAMINOPHEN 5-325 MG PO TABS: 2.0000 | ORAL_TABLET | ORAL | Status: DC | PRN

## 2021-04-18 MED ORDER — LACTATED RINGERS IV SOLN: 500.0000 mL | INTRAVENOUS | Status: DC | PRN

## 2021-04-18 MED ORDER — LACTATED RINGERS IV SOLN: INTRAVENOUS | Status: DC

## 2021-04-18 NOTE — Progress Notes (Signed)
Hayleen Clinkscales is a 40 y.o. G3P1011 at [redacted]w[redacted]d by LMP admitted for induction of labor due to Elective at term.  Subjective:   Objective: BP 132/79    Pulse 73    Temp 98.7 F (37.1 C) (Oral)    Resp 16    LMP 07/14/2020  No intake/output data recorded. No intake/output data recorded.  FHT:  FHR: 130 bpm, variability: moderate,  accelerations:  Present,  decelerations:  Absent UC:   regular, every 2-3 minutes SVE:   4-5/80/-1  Labs: Lab Results  Component Value Date   WBC 10.4 04/18/2021   HGB 11.4 (L) 04/18/2021   HCT 34.4 (L) 04/18/2021   MCV 88.0 04/18/2021   PLT 318 04/18/2021    Assessment / Plan: Induction of labor due to term with favorable cervix,  progressing well on pitocin  Labor: Progressing normally Preeclampsia:  no signs or symptoms of toxicity Fetal Wellbeing:  Category I Pain Control:  Epidural I/D:  n/a Anticipated MOD:  NSVD  Donnamae Jude 04/18/2021, 11:45 PM

## 2021-04-18 NOTE — H&P (Signed)
Shaheen Star is an 40 y.o. [phone] [redacted]w[redacted]d female.   Chief Complaint: term pregnancy  HPI: Here for elective term IOL. Pregnancy complicated by hypothyroid.  Past Medical History:  Diagnosis Date   Hypothyroidism, Levothyroxine only 03/15/2017   Mirena IUD (intrauterine device) in place 03/15/2017    Past Surgical History:  Procedure Laterality Date   NO PAST SURGERIES      Family History  Problem Relation Age of Onset   Cancer Mother    Arthritis Mother    Leukemia Mother    Other Sister        accessory pathways in heart   Stroke Maternal Grandmother    Stroke Paternal Grandmother    Stroke Paternal Grandfather    Social History:  reports that she has never smoked. She has never used smokeless tobacco. She reports that she does not currently use alcohol. She reports that she does not use drugs.  Allergies  Allergen Reactions   Augmentin [Amoxicillin-Pot Clavulanate] Rash   Penicillins Other (See Comments)    Per allergist Causes inflammation of the blood vessels. Did it involve swelling of the face/tongue/throat, SOB, or low BP? No Did it involve sudden or severe rash/hives, skin peeling, or any reaction on the inside of your mouth or nose? No Did you need to seek medical attention at a hospital or doctor's office? Yes When did it last happen? Summer of 2017       If all above answers are NO, may proceed with cephalosporin use.     Medications Prior to Admission  Medication Sig Dispense Refill   aspirin EC 81 MG tablet Take 81 mg by mouth daily. Swallow whole.     levothyroxine (SYNTHROID) 175 MCG tablet Take 1 tablet (175 mcg total) by mouth daily before breakfast. 30 tablet 3   polyethylene glycol (MIRALAX / GLYCOLAX) 17 g packet Take 17 g by mouth daily.     Prenatal Vit-Fe Fumarate-FA (PRENATAL MULTIVITAMIN) TABS tablet Take 1 tablet by mouth at bedtime.      Psyllium (METAMUCIL PO) Take 1 Scoop by mouth daily. 1 tablespoon.     valACYclovir (VALTREX) 500  MG tablet Take 1 tablet (500 mg total) by mouth 2 (two) times daily. 90 tablet 3   acetaminophen (TYLENOL) 500 MG tablet Take 1,000 mg by mouth every 6 (six) hours as needed for mild pain or headache. (Patient not taking: Reported on 04/17/2021)     Doxylamine-Pyridoxine (DICLEGIS) 10-10 MG TBEC Take 2 tablets by mouth at bedtime. If symptoms persist, add one tablet in the morning and one in the afternoon 60 tablet 3     A comprehensive review of systems was negative.  Blood pressure 122/74, pulse 69, temperature 98.8 F (37.1 C), temperature source Oral, resp. rate 16, last menstrual period 07/14/2020, unknown if currently breastfeeding. BP 125/77    Pulse 66    Temp 98.8 F (37.1 C) (Oral)    Resp 18    LMP 07/14/2020  General appearance: alert, cooperative, and appears stated age Head: Normocephalic, without obvious abnormality, atraumatic Neck: supple, symmetrical, trachea midline Lungs:  normal effort Heart: regular rate Abdomen:  gravid, non-tender Extremities: Homans sign is negative, no sign of DVT Skin: Skin color, texture, turgor normal. No rashes or lesions Neurologic: Grossly normal   Lab Results  Component Value Date   WBC 10.4 04/18/2021   HGB 11.4 (L) 04/18/2021   HCT 34.4 (L) 04/18/2021   MCV 88.0 04/18/2021   PLT 318 04/18/2021  ABO, Rh: --/--/A POS (12/29 1649)  Antibody: NEG (12/29 1649)  Rubella: 6.06 (05/20 0910)  RPR: Reactive (10/10 0843)  HBsAg: Negative (05/20 0910)  HIV: Non Reactive (10/10 0843)  GBS: Negative/-- (12/05 0919)     Prenatal Transfer Tool  Maternal Diabetes: No Genetic Screening: Normal Maternal Ultrasounds/Referrals: Normal Fetal Ultrasounds or other Referrals:  None Maternal Substance Abuse:  No Significant Maternal Medications:  Meds include: Syntroid, ASA, Valtrex Significant Maternal Lab Results: Group B Strep negative   Assessment/Plan Principal Problem:   Encounter for induction of labor   #Labor: For  Pitocin and AROM #Pain: May want epidural #FWB: Category 1 #ID: GBS negative     #MOF: breast #MOC:  #Circ:  No #Anemia: Hgb 11.4  Donnamae Jude 04/18/2021, 6:46 PM

## 2021-04-18 NOTE — Anesthesia Preprocedure Evaluation (Signed)
Anesthesia Evaluation  Patient identified by MRN, date of birth, ID band Patient awake    Reviewed: Allergy & Precautions, Patient's Chart, lab work & pertinent test results  Airway Mallampati: II       Dental no notable dental hx.    Pulmonary  Covid 01/2021- resolved   Pulmonary exam normal        Cardiovascular negative cardio ROS Normal cardiovascular exam     Neuro/Psych negative neurological ROS  negative psych ROS   GI/Hepatic Neg liver ROS, GERD  ,  Endo/Other  Hypothyroidism Obesity  Renal/GU negative Renal ROS  negative genitourinary   Musculoskeletal negative musculoskeletal ROS (+)   Abdominal (+) + obese,   Peds  Hematology  (+) anemia ,   Anesthesia Other Findings   Reproductive/Obstetrics (+) Pregnancy                             Anesthesia Physical Anesthesia Plan  ASA: 2  Anesthesia Plan: Epidural   Post-op Pain Management:    Induction:   PONV Risk Score and Plan:   Airway Management Planned: Natural Airway  Additional Equipment:   Intra-op Plan:   Post-operative Plan:   Informed Consent: I have reviewed the patients History and Physical, chart, labs and discussed the procedure including the risks, benefits and alternatives for the proposed anesthesia with the patient or authorized representative who has indicated his/her understanding and acceptance.       Plan Discussed with: Anesthesiologist  Anesthesia Plan Comments:         Anesthesia Quick Evaluation

## 2021-04-19 ENCOUNTER — Encounter (HOSPITAL_COMMUNITY): Payer: Self-pay | Admitting: Obstetrics and Gynecology

## 2021-04-19 ENCOUNTER — Inpatient Hospital Stay (HOSPITAL_COMMUNITY): Payer: BLUE CROSS/BLUE SHIELD | Admitting: Anesthesiology

## 2021-04-19 DIAGNOSIS — Z3A39 39 weeks gestation of pregnancy: Secondary | ICD-10-CM

## 2021-04-19 DIAGNOSIS — O99284 Endocrine, nutritional and metabolic diseases complicating childbirth: Secondary | ICD-10-CM

## 2021-04-19 LAB — RPR: RPR Ser Ql: NONREACTIVE

## 2021-04-19 MED ORDER — LEVOTHYROXINE SODIUM 75 MCG PO TABS
175.0000 ug | ORAL_TABLET | Freq: Every day | ORAL | Status: DC
  Administered 2021-04-19 – 2021-04-20 (×2): 175 ug via ORAL
  Filled 2021-04-19 (×3): qty 1

## 2021-04-19 MED ORDER — PRENATAL MULTIVITAMIN CH
1.0000 | ORAL_TABLET | Freq: Every day | ORAL | Status: DC
  Administered 2021-04-19: 12:00:00 1 via ORAL
  Filled 2021-04-19 (×2): qty 1

## 2021-04-19 MED ORDER — OXYCODONE HCL 5 MG PO TABS: 5.0000 mg | ORAL_TABLET | ORAL | Status: DC | PRN

## 2021-04-19 MED ORDER — IBUPROFEN 600 MG PO TABS
600.0000 mg | ORAL_TABLET | Freq: Four times a day (QID) | ORAL | Status: DC
  Administered 2021-04-19 – 2021-04-20 (×5): 600 mg via ORAL
  Filled 2021-04-19 (×5): qty 1

## 2021-04-19 MED ORDER — DIPHENHYDRAMINE HCL 25 MG PO CAPS: 25.0000 mg | ORAL_CAPSULE | Freq: Four times a day (QID) | ORAL | Status: DC | PRN

## 2021-04-19 MED ORDER — SIMETHICONE 80 MG PO CHEW: 80.0000 mg | CHEWABLE_TABLET | ORAL | Status: DC | PRN

## 2021-04-19 MED ORDER — ACETAMINOPHEN 325 MG PO TABS: 650.0000 mg | ORAL_TABLET | ORAL | Status: DC | PRN

## 2021-04-19 MED ORDER — WITCH HAZEL-GLYCERIN EX PADS: 1.0000 "application " | MEDICATED_PAD | CUTANEOUS | Status: DC | PRN

## 2021-04-19 MED ORDER — TETANUS-DIPHTH-ACELL PERTUSSIS 5-2.5-18.5 LF-MCG/0.5 IM SUSY: 0.5000 mL | PREFILLED_SYRINGE | Freq: Once | INTRAMUSCULAR | Status: DC

## 2021-04-19 MED ORDER — DIBUCAINE (PERIANAL) 1 % EX OINT: 1.0000 "application " | TOPICAL_OINTMENT | CUTANEOUS | Status: DC | PRN

## 2021-04-19 MED ORDER — ONDANSETRON HCL 4 MG/2ML IJ SOLN: 4.0000 mg | INTRAMUSCULAR | Status: DC | PRN

## 2021-04-19 MED ORDER — OXYCODONE HCL 5 MG PO TABS: 10.0000 mg | ORAL_TABLET | ORAL | Status: DC | PRN

## 2021-04-19 MED ORDER — ONDANSETRON HCL 4 MG PO TABS: 4.0000 mg | ORAL_TABLET | ORAL | Status: DC | PRN

## 2021-04-19 MED ORDER — MEASLES, MUMPS & RUBELLA VAC IJ SOLR: 0.5000 mL | Freq: Once | INTRAMUSCULAR | Status: DC

## 2021-04-19 MED ORDER — ZOLPIDEM TARTRATE 5 MG PO TABS: 5.0000 mg | ORAL_TABLET | Freq: Every evening | ORAL | Status: DC | PRN

## 2021-04-19 MED ORDER — LIDOCAINE HCL (PF) 1 % IJ SOLN
INTRAMUSCULAR | Status: DC | PRN
  Administered 2021-04-19: 6 mL via EPIDURAL
  Administered 2021-04-19: 5 mL via EPIDURAL

## 2021-04-19 MED ORDER — POLYETHYLENE GLYCOL 3350 17 G PO PACK
17.0000 g | PACK | Freq: Every day | ORAL | Status: DC
  Administered 2021-04-19: 12:00:00 17 g via ORAL
  Filled 2021-04-19 (×2): qty 1

## 2021-04-19 MED ORDER — SENNOSIDES-DOCUSATE SODIUM 8.6-50 MG PO TABS
2.0000 | ORAL_TABLET | Freq: Every day | ORAL | Status: DC
  Filled 2021-04-19: qty 2

## 2021-04-19 MED ORDER — COCONUT OIL OIL: 1.0000 "application " | TOPICAL_OIL | Status: DC | PRN

## 2021-04-19 MED ORDER — BENZOCAINE-MENTHOL 20-0.5 % EX AERO: 1.0000 "application " | INHALATION_SPRAY | CUTANEOUS | Status: DC | PRN

## 2021-04-19 NOTE — Discharge Summary (Shared)
Postpartum Discharge Summary  Date of Service updated***     Patient Name: Tina [name] DOB: 07-29-1980 MRN: [id number]  Date of admission: 04/18/2021 Delivery date:04/19/2021  Delivering provider: Donnamae Jude  Date of discharge: 04/19/2021  Admitting diagnosis: Encounter for induction of labor [Z34.90] Intrauterine pregnancy: [redacted]w[redacted]d    Secondary diagnosis:  Principal Problem:   Encounter for induction of labor  Additional problems: None    Discharge diagnosis: Term Pregnancy Delivered                                              Post partum procedures: none Augmentation: AROM and Pitocin Complications: None  Hospital course: Induction of Labor With Vaginal Delivery   40y.o. yo G3P1011 at 316w6das admitted to the hospital 04/18/2021 for induction of labor.  Indication for induction: Favorable cervix at term.  Patient had an uncomplicated labor course as follows: Membrane Rupture Time/Date: 9:09 PM ,04/18/2021   Delivery Method:Vaginal, Spontaneous  Episiotomy: None  Lacerations:  2nd degree  Details of delivery can be found in separate delivery note.  Patient had a routine postpartum course. Patient is discharged home 04/19/21.  Newborn Data: Birth date:04/19/2021  Birth time:4:51 AM  Gender:Female  Living status:Living  Apgars:9 ,9  Weight:   Magnesium Sulfate received: No BMZ received: No Rhophylac:N/A MMR:N/A T-DaP:Given prenatally Flu: Given prenatally Transfusion:No  Physical exam  Vitals:   04/19/21 0302 04/19/21 0332 04/19/21 0501 04/19/21 0516  BP: (!) 91/45 (!) 92/54 109/66 (!) 114/48  Pulse: 68 79 89 98  Resp: 16   16  Temp:      TempSrc:      SpO2:      Weight:      Height:       General: alert, cooperative, and no distress Lochia: appropriate Uterine Fundus: firm DVT Evaluation: No evidence of DVT seen on physical exam. Labs: Lab Results  Component Value Date   WBC 10.4 04/18/2021   HGB 11.4 (L) 04/18/2021   HCT 34.4 (L)  04/18/2021   MCV 88.0 04/18/2021   PLT 318 04/18/2021   CMP Latest Ref Rng & Units 07/24/2020  Glucose 65 - 99 mg/dL 88  BUN 7 - 25 mg/dL 13  Creatinine 0.50 - 1.10 mg/dL 0.84  Sodium 135 - 146 mmol/L 140  Potassium 3.5 - 5.3 mmol/L 4.5  Chloride 98 - 110 mmol/L 108  CO2 20 - 32 mmol/L 27  Calcium 8.6 - 10.2 mg/dL 9.0  Total Protein 6.1 - 8.1 g/dL 6.8  Total Bilirubin 0.2 - 1.2 mg/dL 0.4  Alkaline Phos 39 - 117 U/L -  AST 10 - 30 U/L 10  ALT 6 - 29 U/L 10   Edinburgh Score: Edinburgh Postnatal Depression Scale Screening Tool 07/01/2019  I have been able to laugh and see the funny side of things. 0  I have looked forward with enjoyment to things. 0  I have blamed myself unnecessarily when things went wrong. 1  I have been anxious or worried for no good reason. 1  I have felt scared or panicky for no good reason. 0  Things have been getting on top of me. 0  I have been so unhappy that I have had difficulty sleeping. 0  I have felt sad or miserable. 0  I have been so unhappy that I have been crying. 1  The thought of harming myself has occurred to me. 0  Edinburgh Postnatal Depression Scale Total 3     After visit meds:  Allergies as of 04/19/2021       Reactions   Augmentin [amoxicillin-pot Clavulanate] Rash   Penicillins Other (See Comments)   Per allergist Causes inflammation of the blood vessels. Did it involve swelling of the face/tongue/throat, SOB, or low BP? No Did it involve sudden or severe rash/hives, skin peeling, or any reaction on the inside of your mouth or nose? No Did you need to seek medical attention at a hospital or doctor's office? Yes When did it last happen? Summer of 2017       If all above answers are NO, may proceed with cephalosporin use.     Med Rec must be completed prior to using this [facility]***        Discharge home in stable condition Infant Feeding: Breast Infant Disposition:home with mother Discharge instruction: per After  Visit Summary and Postpartum booklet. Activity: Advance as tolerated. Pelvic rest for 6 weeks.  Diet: routine diet Future Appointments: Future Appointments  Date Time Provider [place]  05/22/2021  1:50 PM [name], MD [facility] None   Follow up Visit:  [facility] [place] Follow up.   Specialty: Obstetrics and Gynecology Contact information: [address] [place] [id number] [phone]                 Please schedule this patient for a In person postpartum visit in 4 weeks with the following provider: MD. Additional Postpartum F/U: n/a   Low risk pregnancy complicated by:  none Delivery mode:  Vaginal, Spontaneous  Anticipated Birth Control:  ?   04/19/2021 Donnamae Jude, MD

## 2021-04-19 NOTE — Anesthesia Procedure Notes (Signed)
Epidural Patient location during procedure: OB Start time: 04/19/2021 12:13 AM End time: 04/19/2021 12:23 AM  Staffing Anesthesiologist: Josephine Igo, MD Performed: anesthesiologist   Preanesthetic Checklist Completed: patient identified, IV checked, site marked, risks and benefits discussed, surgical consent, monitors and equipment checked, pre-op evaluation and timeout performed  Epidural Patient position: sitting Prep: DuraPrep and site prepped and draped Patient monitoring: continuous pulse ox and blood pressure Approach: midline Location: L3-L4 Injection technique: LOR air  Needle:  Needle type: Tuohy  Needle gauge: 17 G Needle length: 9 cm and 9 Needle insertion depth: 7 cm Catheter type: closed end flexible Catheter size: 19 Gauge Catheter at skin depth: 12 cm Test dose: negative and Other  Assessment Events: blood not aspirated, injection not painful, no injection resistance, no paresthesia and negative IV test  Additional Notes Patient identified. Risks and benefits discussed including failed block, incomplete  Pain control, post dural puncture headache, nerve damage, paralysis, blood pressure Changes, nausea, vomiting, reactions to medications-both toxic and allergic and post Partum back pain. All questions were answered. Patient expressed understanding and wished to proceed. Sterile technique was used throughout procedure. Epidural site was Dressed with sterile barrier dressing. No paresthesias, signs of intravascular injection Or signs of intrathecal spread were encountered.  Patient was more comfortable after the epidural was dosed. Please see RN's note for documentation of vital signs and FHR which are stable. Reason for block:procedure for pain

## 2021-04-19 NOTE — Progress Notes (Signed)
Tina [name] is a 40 y.o. G3P1011 at [redacted]w[redacted]d by LMP admitted for induction of labor due to Elective at term.  Subjective: Following epidural, noted to have decels. Pitocin cut in half. BP was 95/58. FHR continued to be low and given ephedrine and pitocin off. Then given terbutaline and placed in knee chest with good FHR recovery.   Objective: BP 102/76    Pulse (!) 110    Temp 98.7 F (37.1 C) (Oral)    Resp 18    Ht 5\' 9"  (1.753 m)    Wt 108.4 kg    LMP 07/14/2020    SpO2 97%    BMI 35.29 kg/m   FHT:  FHR: 135 bpm, variability: moderate,  accelerations:  Present,  decelerations:  Absent UC:   regular, every 2-3 minutes SVE:   Dilation: 8 Effacement (%): 100 Station: -2 Exam by:: Dr. Kennon Rounds  Labs: Lab Results  Component Value Date   WBC 10.4 04/18/2021   HGB 11.4 (L) 04/18/2021   HCT 34.4 (L) 04/18/2021   MCV 88.0 04/18/2021   PLT 318 04/18/2021    Assessment / Plan: Induction of labor due to term with favorable cervix,  progressing well on pitocin some mild BP disturbance following epidural which lead to FHR decels.  Labor: Progressing normally Preeclampsia:  no signs or symptoms of toxicity Fetal Wellbeing:  Category I Pain Control:  Epidural I/D:  n/a Anticipated MOD:  NSVD  Tina [name] 04/19/2021, 1:11 AM

## 2021-04-19 NOTE — Lactation Note (Signed)
This note was copied from a baby's chart. Lactation Consultation Note  Patient Name: Tina [name] Today's Date: 04/19/2021 Reason for consult: Initial assessment;Term;Maternal endocrine disorder Age:38 hours  LC in to visit with P2 Mom of term baby.  Mom reports baby is latching well, reviewed importance of a deep latch to the breast.  Mom just stopped breastfeeding her 1st baby 3 months ago. Mom feeling some tenderness on nipples, encouraged colostrum drops onto nipples to help.  Mom aware of lactation support available and encouraged to call prn.  Mom encouraged to keep baby STS as much as possible   Maternal Data Has patient been taught Hand Expression?: Yes Does the patient have breastfeeding experience prior to this delivery?: Yes How long did the patient breastfeed?: 19 months  Feeding Mother's Current Feeding Choice: Breast Milk  Interventions Interventions: Breast feeding basics reviewed;Skin to skin;Breast massage;Hand express;LC Services brochure  Consult Status Consult Status: Follow-up Date: 04/20/21 Follow-up type: In-patient    Tina [name] 04/19/2021, 11:52 AM

## 2021-04-19 NOTE — Plan of Care (Signed)
Problem: Education: Goal: Individualized Educational Video(s) Outcome: Completed/Met   Problem: Coping: Goal: Ability to verbalize concerns and feelings about labor and delivery will improve Outcome: Completed/Met   Problem: Life Cycle: Goal: Ability to make normal progression through stages of labor will improve Outcome: Completed/Met Goal: Ability to effectively push during vaginal delivery will improve Outcome: Completed/Met   Problem: Role Relationship: Goal: Will demonstrate positive interactions with the child Outcome: Completed/Met   Problem: Safety: Goal: Risk of complications during labor and delivery will decrease Outcome: Completed/Met   Problem: Pain Management: Goal: Relief or control of pain from uterine contractions will improve Outcome: Completed/Met   Problem: Education: Goal: Knowledge of General Education information will improve Description: Including pain rating scale, medication(s)/side effects and non-pharmacologic comfort measures Outcome: Completed/Met   Problem: Health Behavior/Discharge Planning: Goal: Ability to manage health-related needs will improve Outcome: Completed/Met   Problem: Clinical Measurements: Goal: Ability to maintain clinical measurements within normal limits will improve Outcome: Completed/Met Goal: Will remain free from infection Outcome: Completed/Met Goal: Diagnostic test results will improve Outcome: Completed/Met Goal: Respiratory complications will improve Outcome: Completed/Met Goal: Cardiovascular complication will be avoided Outcome: Completed/Met   Problem: Activity: Goal: Risk for activity intolerance will decrease Outcome: Completed/Met   Problem: Nutrition: Goal: Adequate nutrition will be maintained Outcome: Completed/Met   Problem: Coping: Goal: Level of anxiety will decrease Outcome: Completed/Met   Problem: Elimination: Goal: Will not experience complications related to bowel  motility Outcome: Completed/Met Goal: Will not experience complications related to urinary retention Outcome: Completed/Met   Problem: Pain Managment: Goal: General experience of comfort will improve Outcome: Completed/Met   Problem: Safety: Goal: Ability to remain free from injury will improve Outcome: Completed/Met   Problem: Skin Integrity: Goal: Risk for impaired skin integrity will decrease Outcome: Completed/Met

## 2021-04-20 MED ORDER — IBUPROFEN 600 MG PO TABS: 600.0000 mg | ORAL_TABLET | Freq: Four times a day (QID) | ORAL | 0 refills | Status: DC

## 2021-04-20 NOTE — Anesthesia Postprocedure Evaluation (Signed)
Anesthesia Post Note  Patient: Tina [name]  Procedure(s) Performed: AN AD [place]     Patient location during evaluation: Mother Baby Anesthesia Type: Epidural Level of consciousness: awake and alert and oriented Vital Signs Assessment: post-procedure vital signs reviewed and stable Respiratory status: spontaneous breathing Cardiovascular status: blood pressure returned to baseline Postop Assessment: no headache, no backache, no apparent nausea or vomiting, adequate PO intake and able to ambulate Anesthetic complications: no   No notable events documented.  Last Vitals:  Vitals:   04/19/21 1955 04/20/21 0610  BP: 111/62 114/68  Pulse: 82 80  Resp: 17 18  Temp: 36.7 C 36.8 C  SpO2: 99% 100%    Last Pain:  Vitals:   04/20/21 0610  TempSrc: Oral  PainSc:                  Fulton Reek, CRNA

## 2021-04-20 NOTE — Lactation Note (Signed)
This note was copied from a baby's chart. Lactation Consultation Note  Patient Name: Tina [name] TRRNH'A Date: 04/20/2021 Reason for consult: Follow-up assessment;Term;Maternal endocrine disorder Age:38 hours  LC visited with P2 Mom of term baby.  Mom desires an early discharge saying baby is latching and feeding at the breast well.  Weight loss is 4% with good output and bilirubin level low.   Mom aware of OP lactation support and encouraged to call prn  Engorgement treatment reviewed. Recommended STS and feeding baby often with cues.    Interventions Interventions: Breast feeding basics reviewed;Skin to skin;Breast massage;Hand express  Discharge Discharge Education: Engorgement and breast care;Outpatient recommendation  Consult Status Consult Status: Complete Date: 04/20/21 Follow-up type: Call as needed    Tina [name] 04/20/2021, 9:45 AM

## 2021-04-20 NOTE — Discharge Summary (Signed)
Postpartum Discharge Summary    Patient Name: Tina [name] DOB: [birth year] MRN: [id number]  Date of admission: 04/18/2021 Delivery date:04/19/2021  Delivering provider: Donnamae Jude  Date of discharge: 04/20/2021  Admitting diagnosis: Encounter for induction of labor [Z34.90] Intrauterine pregnancy: [redacted]w[redacted]d    Secondary diagnosis:  Principal Problem:   Encounter for induction of labor Active Problems:   Normal delivery at term  Additional problems:     Discharge diagnosis: Term Pregnancy Delivered                                              Post partum procedures: none Augmentation: AROM and Pitocin Complications: low BP after epidural.   Hospital course: Induction of Labor With Vaginal Delivery   40y.o. yo [id number] 38w6das admitted to the hospital 04/18/2021 for induction of labor.  Indication for induction: Favorable cervix at term and Elective.  Patient had an uncomplicated labor course as follows: Membrane Rupture Time/Date: 9:09 PM ,04/18/2021   Delivery Method:Vaginal, Spontaneous  Episiotomy: None  Lacerations:  2nd degree  Details of delivery can be found in separate delivery note.  Patient had a routine postpartum course. Patient is discharged home 04/20/21.  Newborn Data: Birth date:04/19/2021  Birth time:4:51 AM  Gender:Female  Living status:Living  Apgars:9 ,9  Weight:3561 g   Magnesium Sulfate received: No BMZ received: No Rhophylac:N/A MMR:N/A T-DaP:Given prenatally Flu: N/A prenatally Transfusion:No  Physical exam  Vitals:   04/19/21 1141 04/19/21 1618 04/19/21 1955 04/20/21 0610  BP: 106/67 120/74 111/62 114/68  Pulse: 80 79 82 80  Resp: 18 18 17 18   Temp: 98.2 F (36.8 C) 97.9 F (36.6 C) 98 F (36.7 C) 98.2 F (36.8 C)  TempSrc: Oral Oral Oral Oral  SpO2: 100% 100% 99% 100%  Weight:      Height:       General: alert, cooperative, and no distress Lochia: appropriate Uterine Fundus: firm Incision: N/A DVT Evaluation: No  evidence of DVT seen on physical exam. Labs: Lab Results  Component Value Date   WBC 10.4 04/18/2021   HGB 11.4 (L) 04/18/2021   HCT 34.4 (L) 04/18/2021   MCV 88.0 04/18/2021   PLT 318 04/18/2021   CMP Latest Ref Rng & Units 07/24/2020  Glucose 65 - 99 mg/dL 88  BUN 7 - 25 mg/dL 13  Creatinine 0.50 - 1.10 mg/dL 0.84  Sodium 135 - 146 mmol/L 140  Potassium 3.5 - 5.3 mmol/L 4.5  Chloride 98 - 110 mmol/L 108  CO2 20 - 32 mmol/L 27  Calcium 8.6 - 10.2 mg/dL 9.0  Total Protein 6.1 - 8.1 g/dL 6.8  Total Bilirubin 0.2 - 1.2 mg/dL 0.4  Alkaline Phos 39 - 117 U/L -  AST 10 - 30 U/L 10  ALT 6 - 29 U/L 10   Edinburgh Score: Edinburgh Postnatal Depression Scale Screening Tool 04/19/2021  I have been able to laugh and see the funny side of things. 0  I have looked forward with enjoyment to things. 0  I have blamed myself unnecessarily when things went wrong. 0  I have been anxious or worried for no good reason. 1  I have felt scared or panicky for no good reason. 0  Things have been getting on top of me. 0  I have been so unhappy that I have had difficulty sleeping. 0  I have felt sad or miserable. 0  I have been so unhappy that I have been crying. 1  The thought of harming myself has occurred to me. 0  Edinburgh Postnatal Depression Scale Total 2      After visit meds:  Allergies as of 04/20/2021       Reactions   Augmentin [amoxicillin-pot Clavulanate] Rash   Penicillins Other (See Comments)   Per allergist Causes inflammation of the blood vessels. Did it involve swelling of the face/tongue/throat, SOB, or low BP? No Did it involve sudden or severe rash/hives, skin peeling, or any reaction on the inside of your mouth or nose? No Did you need to seek medical attention at a hospital or doctor's office? Yes When did it last happen? Summer of 2017       If all above answers are NO, may proceed with cephalosporin use.        Medication List     STOP taking these  medications    aspirin EC 81 MG tablet   Doxylamine-Pyridoxine 10-10 MG Tbec Commonly known as: Diclegis   valACYclovir 500 MG tablet Commonly known as: VALTREX       TAKE these medications    acetaminophen 500 MG tablet Commonly known as: TYLENOL Take 1,000 mg by mouth every 6 (six) hours as needed for mild pain or headache.   ibuprofen 600 MG tablet Commonly known as: ADVIL Take 1 tablet (600 mg total) by mouth every 6 (six) hours.   levothyroxine 175 MCG tablet Commonly known as: SYNTHROID Take 1 tablet (175 mcg total) by mouth daily before breakfast.   METAMUCIL PO Take 1 Scoop by mouth daily. 1 tablespoon.   polyethylene glycol 17 g packet Commonly known as: MIRALAX / GLYCOLAX Take 17 g by mouth daily.   prenatal multivitamin Tabs tablet Take 1 tablet by mouth at bedtime.         Discharge home in stable condition Infant Feeding: Breast Infant Disposition:home with mother Discharge instruction: per After Visit Summary and Postpartum booklet. Activity: Advance as tolerated. Pelvic rest for 6 weeks.  Diet: routine diet Anticipated Birth Control: IUD- oupatient Postpartum Appointment:4 weeks Additional Postpartum F/U:  none Future Appointments: Future Appointments  Date Time Provider [place]  05/22/2021  1:50 PM [name], MD [facility] None   Follow up Visit:  [facility] [place] Follow up.   Specialty: Obstetrics and Gynecology Contact information: [address] [place] [address] [phone]                    04/20/2021 Caren Macadam, MD

## 2021-05-01 ENCOUNTER — Telehealth (HOSPITAL_COMMUNITY): Payer: Self-pay | Admitting: *Deleted

## 2021-05-01 NOTE — Telephone Encounter (Signed)
Mom reports feeling good. No concerns about herself at this time. EPDS=2(Hospital score=2) Mom reports baby is doing well. Feeding, peeing, and pooping without difficulty. Safe sleep reviewed. Mom reports no concerns about baby at present.  Odis Hollingshead, RN 05-01-2021 at 9:49am

## 2021-05-22 ENCOUNTER — Other Ambulatory Visit (HOSPITAL_COMMUNITY)
Admission: RE | Admit: 2021-05-22 | Discharge: 2021-05-22 | Disposition: A | Discharge status: Home / Self Care | Payer: BLUE CROSS/BLUE SHIELD | Source: Ambulatory Visit | Attending: Obstetrics & Gynecology | Admitting: Obstetrics & Gynecology

## 2021-05-22 ENCOUNTER — Ambulatory Visit (INDEPENDENT_AMBULATORY_CARE_PROVIDER_SITE_OTHER): Payer: BLUE CROSS/BLUE SHIELD | Admitting: Obstetrics & Gynecology

## 2021-05-22 ENCOUNTER — Other Ambulatory Visit: Payer: Self-pay

## 2021-05-22 ENCOUNTER — Encounter: Payer: Self-pay | Admitting: Obstetrics & Gynecology

## 2021-05-22 DIAGNOSIS — N760 Acute vaginitis: Secondary | ICD-10-CM | POA: Insufficient documentation

## 2021-05-22 DIAGNOSIS — E038 Other specified hypothyroidism: Secondary | ICD-10-CM

## 2021-05-22 DIAGNOSIS — B9689 Other specified bacterial agents as the cause of diseases classified elsewhere: Secondary | ICD-10-CM | POA: Diagnosis not present

## 2021-05-22 DIAGNOSIS — Z30430 Encounter for insertion of intrauterine contraceptive device: Secondary | ICD-10-CM | POA: Diagnosis not present

## 2021-05-22 MED ORDER — METRONIDAZOLE 500 MG PO TABS: 500.0000 mg | ORAL_TABLET | Freq: Two times a day (BID) | ORAL | 0 refills | Status: DC

## 2021-05-22 MED ORDER — LEVONORGESTREL 20.1 MCG/DAY IU IUD
1.0000 | INTRAUTERINE_SYSTEM | Freq: Once | INTRAUTERINE | Status: AC
Start: 2021-05-22 — End: 2021-05-22
  Administered 2021-05-22: 1 via INTRAUTERINE

## 2021-05-22 MED ORDER — LEVOTHYROXINE SODIUM 112 MCG PO TABS: 112.0000 ug | ORAL_TABLET | Freq: Every day | ORAL | 3 refills | Status: DC

## 2021-05-22 NOTE — Progress Notes (Signed)
[place] Partum Visit Note  Tina [name] is a 41 y.o. [phone] female who presents for a postpartum visit. She is 4 weeks postpartum following a normal spontaneous vaginal delivery.  I have fully reviewed the prenatal and intrapartum course. The delivery was at 39.5 gestational weeks.  Anesthesia: none. Postpartum course has been uneventful. Baby is doing well. Baby is feeding by breast. Bleeding no bleeding. Bowel function is normal. Bladder function is normal. Patient is not sexually active. Contraception method is IUD. Postpartum depression screening: negative.   The pregnancy intention screening data noted above was reviewed. Potential methods of contraception were discussed. The patient elected to proceed with No data recorded.    Health Maintenance Due  Topic Date Due   COVID-19 Vaccine (3 - Pfizer risk series) 07/29/2019    The following portions of the patient's history were reviewed and updated as appropriate: allergies, current medications, past family history, past medical history, past social history, past surgical history, and problem list.  Review of Systems Pertinent items are noted in HPI.  Objective:  LMP 07/14/2020  Blood pressure (!) 119/55, pulse 75, weight 230 lb (104.3 kg), last menstrual period 07/14/2020, currently breastfeeding.   CONSTITUTIONAL: Well-developed, well-nourished female in no acute distress.  HENT:  Normocephalic, atraumatic EYES: Conjunctivae and EOM are normal. No scleral icterus.  NECK: Normal range of motion SKIN: Skin is warm and dry. No rash noted. Not diaphoretic.No pallor. [place]: Alert and oriented to person, place, and time. Normal coordination.       GYNECOLOGY CLINIC PROCEDURE NOTE: IUD Insertion Procedure Note Patient identified, informed consent performed.  Discussed risks of irregular bleeding, cramping, infection, malpositioning or misplacement of the IUD outside the uterus which may require further procedures. Time out  was performed.  Urine pregnancy test negative.  Speculum placed in the vagina.  Cervix visualized.  Cleaned with Betadine x 2.  Grasped anteriorly with a single tooth tenaculum.  Uterus sounded to 3 cm.  Liletta IUD placed per manufacturer's recommendations.  Strings trimmed to 3 cm. Tenaculum was removed, good hemostasis noted.  Patient tolerated procedure well.    Assessment:    There are no diagnoses linked to this encounter.  4 postpartum exam.   Plan:   Essential components of care per ACOG recommendations:  1.  Mood and well being: Patient with negative depression screening today. Reviewed local resources for support.  - Patient tobacco use? No.   - hx of drug use? No.    2. Infant care and feeding:  -Patient currently breastmilk feeding?  -Social determinants of health (SDOH) reviewed in EPIC. No concerns  3. Sexuality, contraception and birth spacing - Patient does not want a pregnancy in the next year.  Desired family size is unknown children.  - Reviewed forms of contraception in tiered fashion. Patient desired IUD today.   - Discussed birth spacing of 18 months  4. Sleep and fatigue -Encouraged family/partner/community support of 4 hrs of uninterrupted sleep to help with mood and fatigue  5. Physical Recovery  - Discussed patients delivery and complications. She describes her labor as mixed. - Patient had a Vaginal, no problems at delivery. Patient had a 2nd degree laceration. Perineal healing reviewed. Patient expressed understanding - Patient has urinary incontinence? No. - Patient is not safe to resume physical and sexual activity  6.  Health Maintenance - HM due items addressed Yes - Last pap smear  Diagnosis  Date Value Ref Range Status  11/01/2018   Final  NEGATIVE FOR INTRAEPITHELIAL LESIONS OR MALIGNANCY.   Pap smear not done at today's visit.  -Breast Cancer screening indicated? No.   7. Chronic Disease/Pregnancy Condition follow up:  Hypothyroidism Changed back to prepregnancy dosages.  - PCP follow up    Thomas. Harraway-Smith, M.D., Cherlynn June

## 2021-05-22 NOTE — Patient Instructions (Signed)
Intrauterine Device Information An intrauterine device (IUD) is a medical device that is inserted into the uterus to prevent pregnancy. It is a small, T-shaped device that has one or two nylon strings hanging down from it. The strings hang out of the lower part of the uterus (cervix) to allow for future IUD removal. There are two types of IUDs: Hormone IUD. This type of IUD is made of plastic and contains the hormone progestin (synthetic progesterone). A hormone IUD may last 3-5 years. Copper IUD. This type of IUD has copper wire wrapped around it. A copper IUD may last up to 10 years. How is an IUD inserted? An IUD is inserted through the vagina, through the cervix, and into the uterus with a minor medical procedure. The procedure for IUD insertion may vary among health care providers and hospitals. How does an IUD work? Synthetic progesterone in a hormonal IUD prevents pregnancy by: Thickening cervical mucus to prevent sperm from entering the uterus. Thinning the uterine lining to prevent a fertilized egg from being implanted there. Copper in a copper IUD prevents pregnancy by making the uterus and fallopian tubes produce a fluid that kills sperm. What are the advantages of an IUD? Advantages of either type of IUD An IUD: Is highly effective in preventing pregnancy. Is reversible. You can become pregnant shortly after the IUD is removed. Is low-maintenance and can stay in place for a long time. Has no estrogen-related side effects. Can be used when breastfeeding. Is not associated with weight gain. Can be inserted right after childbirth, an abortion, or a miscarriage. Advantages of a hormone IUD If it is inserted within 7 days of your period starting, it works right after it has been inserted. If the hormone IUD is inserted at any other time in your cycle, you will need to use a backup method of birth control for 7 days after insertion. It can make menstrual periods lighter or stop  completely. It can reduce menstrual cramping and other discomforts from menstrual periods. It can be used for 3-5 years, depending on which IUD you have. Advantages of a copper IUD It works right after it is inserted. It can be used as a form of emergency birth control if it is inserted within 5 days after having unprotected sex. It does not interfere with your body's natural hormones. It can be used for up to 10 years. What are the disadvantages of an IUD? An IUD may cause irregular menstrual bleeding for a period of time after insertion. It is common to have pain during insertion and have cramping and vaginal bleeding after insertion. An IUD may cut the uterus (uterine perforation) when it is inserted. This is rare. Pelvic inflammatory disease (PID) may happen after insertion of an IUD. PID is an infection in the uterus and fallopian tubes. The IUD does not cause the infection. The infection is usually from an unknown sexually transmitted infection (STI). This is rare, and it usually happens during the first 20 days after the IUD is inserted. A copper IUD can make your menstrual flow heavier and more painful. IUDs cannot prevent sexually transmitted infections (STIs). How is an IUD removed?  You will lie on your back with your knees bent and your feet in footrests (stirrups). A device will be inserted into your vagina to spread apart the vaginal walls (speculum). This will allow your health care provider to see the strings attached to the IUD. Your health care provider will use a small instrument (forceps) to  grasp the IUD strings and will pull firmly until the IUD is removed. You may have some discomfort when the IUD is removed. Your health care provider may recommend taking over-the-counter pain relievers, such as ibuprofen, before the procedure. You may also have minor spotting for a few days after the procedure. The procedure for IUD removal may vary among health care providers and  hospitals. Is an IUD right for me? If you are interested in an IUD, discuss it with your health care provider. He or she will make sure you are a good candidate for an IUD and will let you know more about the advantages, disadvantage, and possible side effects. This will allow you to make a decision about the device. Summary An intrauterine device (IUD) is a medical device that is inserted in the uterus to prevent pregnancy. It is a small, T-shaped device that has one or two nylon strings hanging down from it. A hormone IUD contains the hormone progestin (synthetic progesterone). A copper IUD has copper wire wrapped around it. Synthetic progesterone in a hormone IUD prevents pregnancy by thickening cervical mucus and thinning the walls of the uterus. Copper in a copper IUD prevents pregnancy by making the uterus and fallopian tubes produce a fluid that kills sperm. A hormone IUD can be left in place for 3-5 years. A copper IUD can be left in place for up to 10 years. An IUD is inserted and removed by a health care provider. You may feel some pain during insertion and removal. Your health care provider may recommend taking over-the-counter pain medicine, such as ibuprofen, before an IUD procedure. This information is not intended to replace advice given to you by your health care provider. Make sure you discuss any questions you have with your health care provider. Document Revised: 10/19/2019 Document Reviewed: 10/19/2019 Elsevier Patient Education  2022 [employer]. Intrauterine Device Insertion, Care After This sheet gives you information about how to care for yourself after your procedure. Your health care provider may also give you more specific instructions. If you have problems or questions, contact your health care provider. What can I expect after the procedure? After the procedure, it is common to have: Cramps and pain in the abdomen. Bleeding. It may be light or heavy. This may last for  a few days. Lower back pain. Dizziness. Headaches. Nausea. Follow these instructions at home:  Before resuming sexual activity, check to make sure that you can feel the IUD string or strings. You should be able to feel the end of the string below the opening of your cervix. If your IUD string is in place, you may resume sexual activity. If you had a hormonal IUD inserted more than 7 days after your most recent period started, you will need to use a backup method of birth control for 7 days after IUD insertion. Ask your health care provider whether this applies to you. Continue to check that the IUD is still in place by feeling for the strings after every menstrual period, or once a month. An IUD will not protect you from sexually transmitted infections (STIs). Use methods to prevent the exchange of body fluids between partners (barrier protection) every time you have sex. Barrier protection can be used during oral, vaginal, or anal sex. Commonly used barrier methods include: Female condom. Female condom. Dental dam. Take over-the-counter and prescription medicines only as told by your health care provider. Keep all follow-up visits as told by your health care provider. This is important.  Contact a health care provider if: You feel light-headed or weak. You have any of the following problems with your IUD string or strings: The string bothers or hurts you or your sexual partner. You cannot feel the string. The string has gotten longer. You can feel the IUD in your vagina. You think you may be pregnant, or you miss your menstrual period. You think you may have a sexually transmitted infection (STI). Get help right away if: You have flu-like symptoms, such as tiredness (fatigue) and muscle aches. You have a fever and chills. You have bleeding that is heavier or lasts longer than a normal menstrual cycle. You have abnormal or bad-smelling discharge from your vagina. You develop abdominal pain  that is new, is getting worse, or is not in the same area of earlier cramping and pain. You have pain during sexual activity. Summary After the procedure, it is common to have cramps and pain in the abdomen. It is also common to have light bleeding or heavier bleeding that is like your menstrual period. Continue to check that the IUD is still in place by feeling for the strings after every menstrual period, or once a month. Keep all follow-up visits as told by your health care provider. This is important. Contact your health care provider if you have problems with your IUD strings, such as the string getting longer or bothering you or your sexual partner. This information is not intended to replace advice given to you by your health care provider. Make sure you discuss any questions you have with your health care provider. Document Revised: 03/29/2019 Document Reviewed: 03/29/2019 Elsevier Patient Education  [place].

## 2021-05-24 LAB — CERVICOVAGINAL ANCILLARY ONLY
Bacterial Vaginitis (gardnerella): NEGATIVE
Candida Glabrata: NEGATIVE
Candida Vaginitis: NEGATIVE
Comment: NEGATIVE
Comment: NEGATIVE
Comment: NEGATIVE

## 2021-06-03 ENCOUNTER — Encounter: Payer: Self-pay | Admitting: Obstetrics & Gynecology

## 2021-06-20 ENCOUNTER — Telehealth: Payer: Self-pay

## 2021-06-24 IMAGING — US US MFM OB LIMITED
1 series · 14 of 26 positions shown · non-contrast
Comparison: none

[Series 1: us mfm ob limited · 14 of 26 slices shown]
[im 1/26]
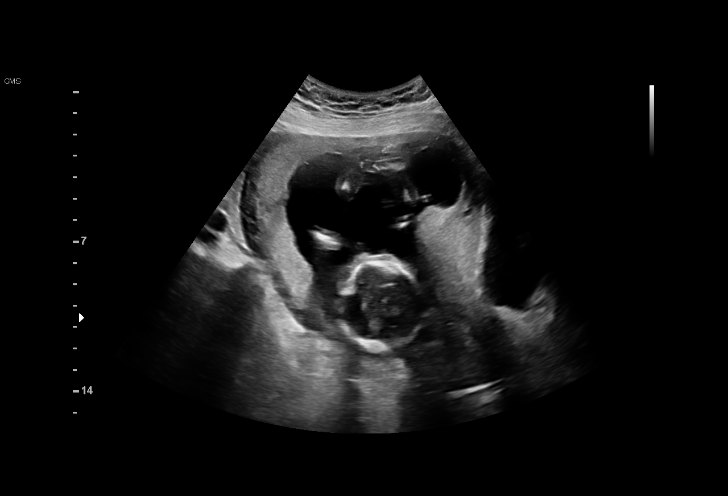
[im 3/26]
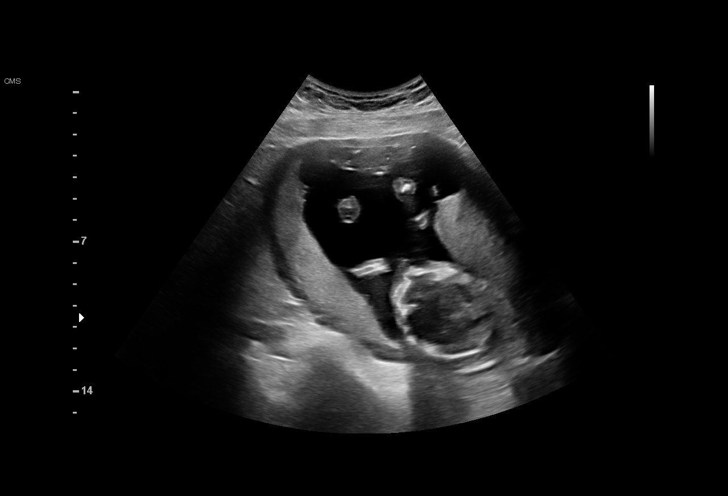
[im 5/26]
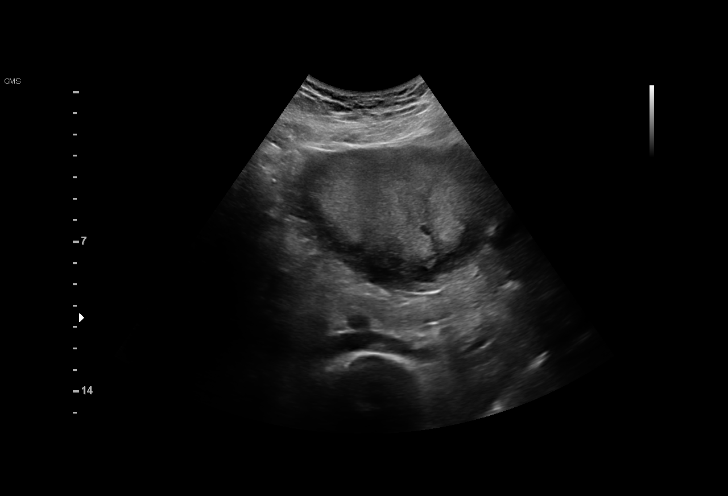
[im 7/26]
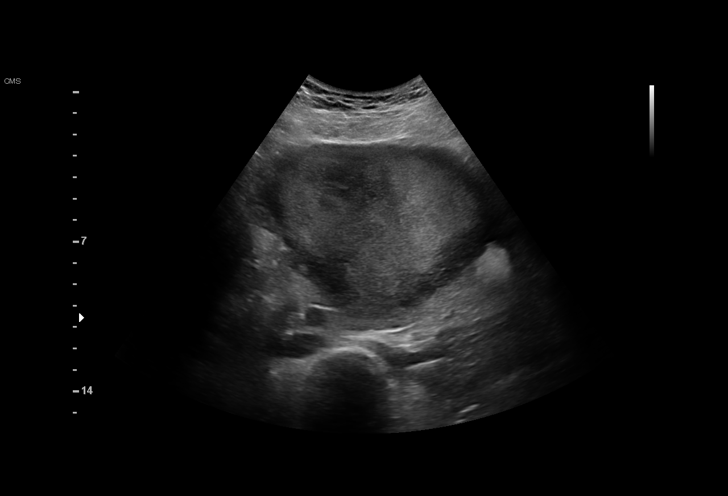
[im 9/26]
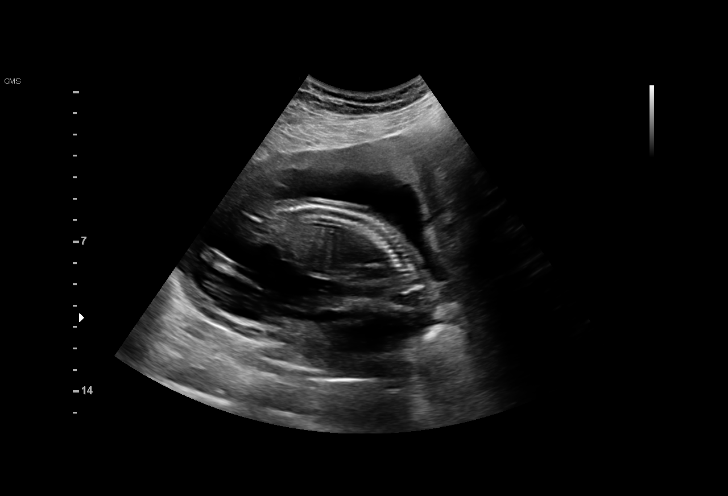
[im 11/26]
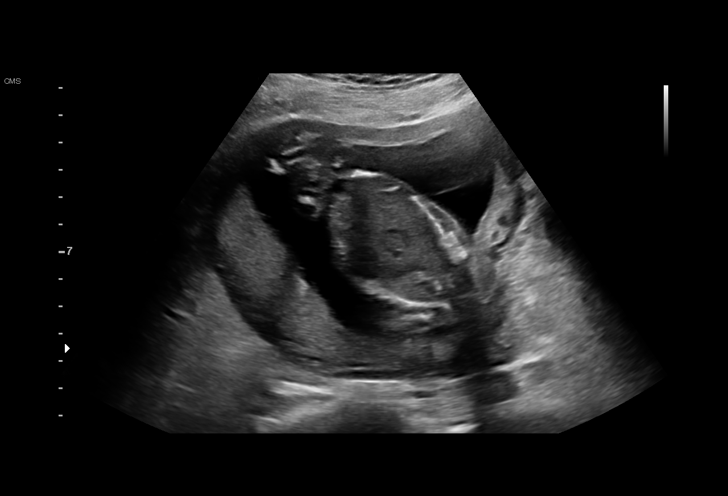
[im 13/26]
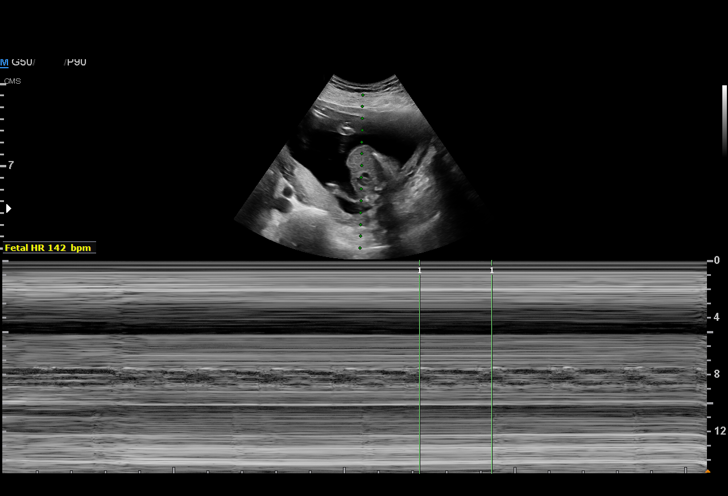
[im 14/26]
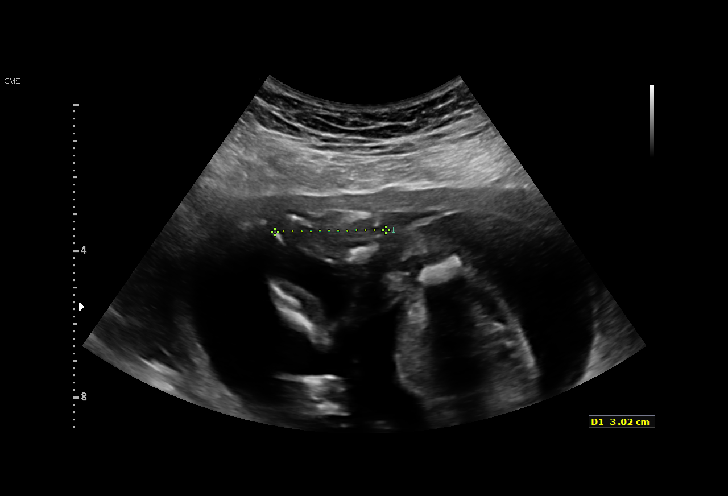
[im 16/26]
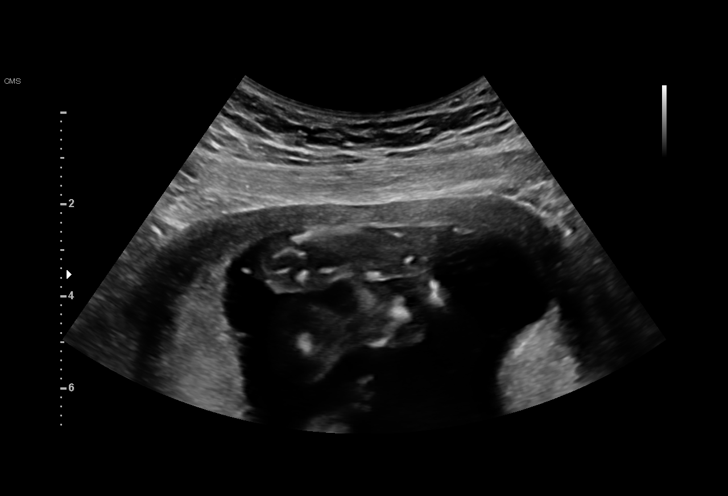
[im 18/26]
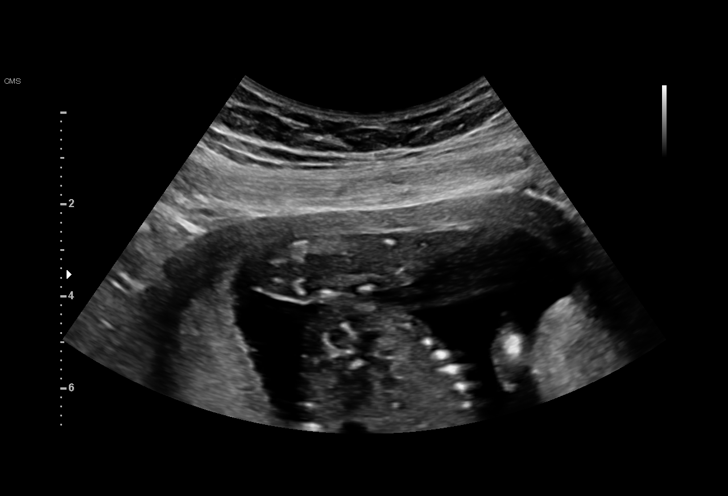
[im 20/26]
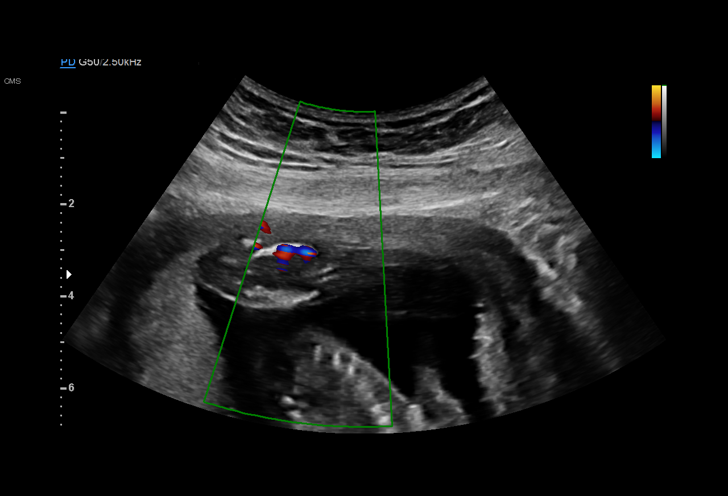
[im 22/26]
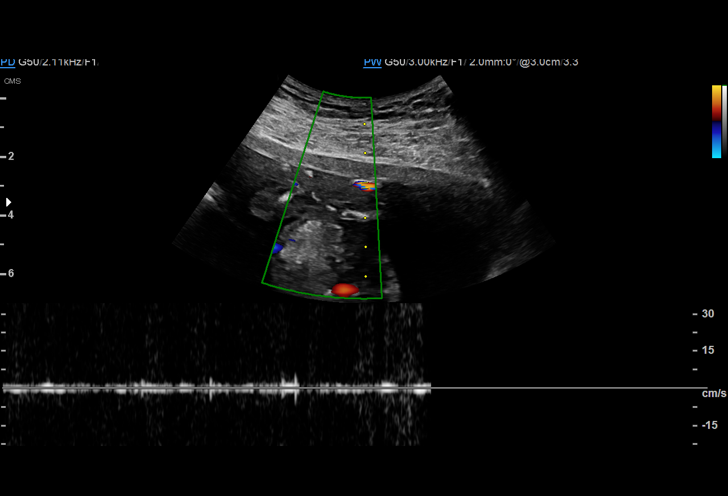
[im 24/26]
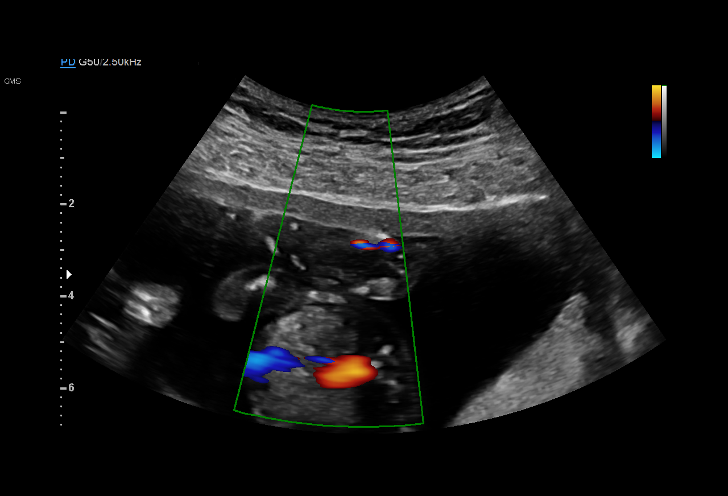
[im 26/26]
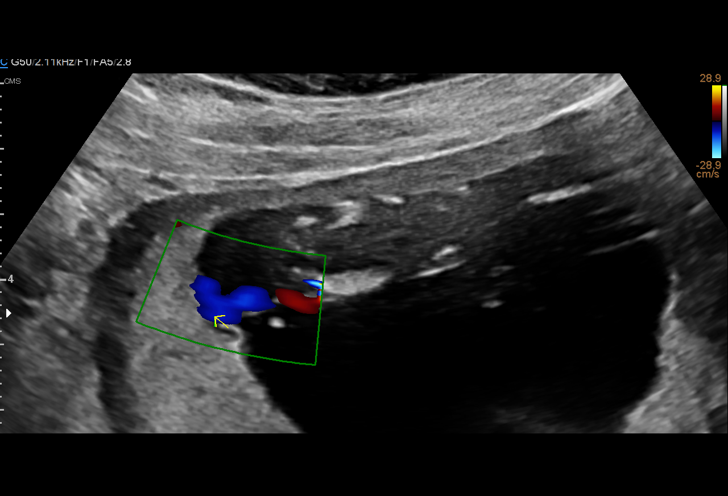

[14 of 26 positions shown; findings below may reference images not displayed]

----------------------------------------------------------------------

 ----------------------------------------------------------------------
Indications

  Abnormal fetal ultrasound (TRAP)
  Twin pregnancy with loss of one fetus,         O30.009,
  antepartum
  Twin pregnancy, Cu/[place], second trimester
  Advanced maternal age primigravida 35+,
  second trimester
  Hypothyroid
  16 weeks gestation of pregnancy
 ----------------------------------------------------------------------
Fetal Evaluation (Fetus A)

 Num Of Fetuses:         2
 Fetal Heart Rate(bpm):  142
 Cardiac Activity:       Observed
 Presentation:           Cephalic
 Placenta:               Posterior

 Amniotic Fluid
 AFI FV:      Within normal limits

                             Largest Pocket(cm)

OB History

 Gravidity:    1         Term:   0        Prem:   0        SAB:   0
 TOP:          0       Ectopic:  0        Living: 0
Gestational Age (Fetus A)
 LMP:           16w 6d        Date:  09/03/18                 EDD:   06/10/19
 Best:          16w 6d     Det. By:  LMP  (09/03/18)          EDD:   06/10/19

Fetal Evaluation (Fetus B)

 Num Of Fetuses:         2
 Cardiac Activity:       Not visualized
Gestational Age (Fetus B)

 LMP:           16w 6d        Date:  09/03/18                 EDD:   06/10/19
 Best:          16w 6d     Det. By:  LMP  (09/03/18)          EDD:   06/10/19
Cervix Uterus Adnexa

 Cervix
 Length:            3.7  cm.
 Normal appearance by transabdominal scan.

 Uterus
 No abnormality visualized.

 Left Ovary
 Not visualized.

 Right Ovary
 Not visualized.

 Cul De Sac
 No free fluid seen.

 Adnexa
 No abnormality visualized.
Impression

 Monochorionic-diamniotic twin pregnancy with TRAP
 sequence.  Twin A is a pump twin.

 Patient was scheduled to undergo cold occlusion (laser
 coagulation) last week at [REDACTED] in
 [place].  Ultrasound performed there on 12/23/2018, resolution
 of TRAP sequence was seen.  The procedure was, therefore,
 not performed.

 On today's ultrasound,
 Twin A: Amniotic fluid is normal good active fetal activity
 seen.  No evidence of pleural or pericardial effusion.  No
 evidence of hydrops.

 Twin B: Fundal in location.  Severe oligohydramnios is seen.
 No identifiable cardiac structures seen.  No blood flow from
 Twin A is seen into the twin.

 It appears TRAP sequence has resolved.  We reassured the
 patient of the findings.  Expect complete resolution and
 possible resorption of twin B and good fetal outcome in twin A.
Recommendations

 -An appointment was made for her to return in 2 weeks for
 fetal anatomy scan.
                 [place]

## 2021-06-26 ENCOUNTER — Ambulatory Visit: Payer: BLUE CROSS/BLUE SHIELD | Admitting: Obstetrics & Gynecology

## 2021-07-02 ENCOUNTER — Other Ambulatory Visit: Payer: Self-pay

## 2021-07-02 MED ORDER — VALACYCLOVIR HCL 500 MG PO TABS: 500.0000 mg | ORAL_TABLET | Freq: Two times a day (BID) | ORAL | 3 refills | Status: AC

## 2021-07-02 NOTE — Progress Notes (Signed)
Refill request sent via fax. Kathrene Alu RN  ?

## 2021-07-09 IMAGING — US US MFM OB DETAIL+14 WK
1 series · 13 of 28 positions shown · non-contrast
Comparison: none

[Series 1: us mfm ob detail+14 wk · 99 acquisitions, 13 frames shown]
[im 4/99]
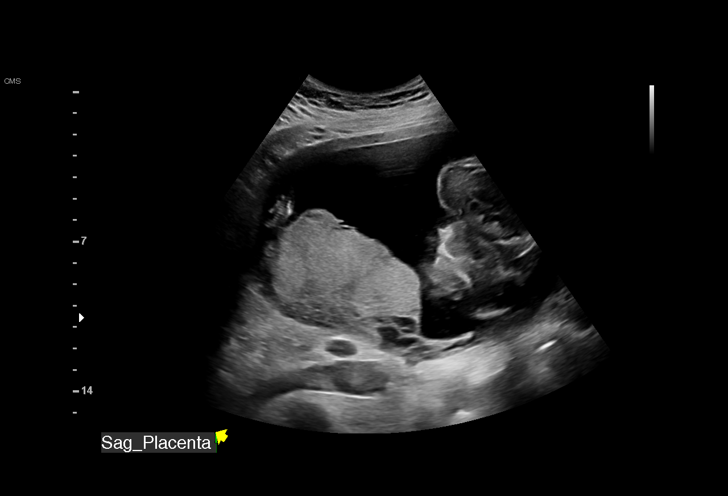
[im 11/99]
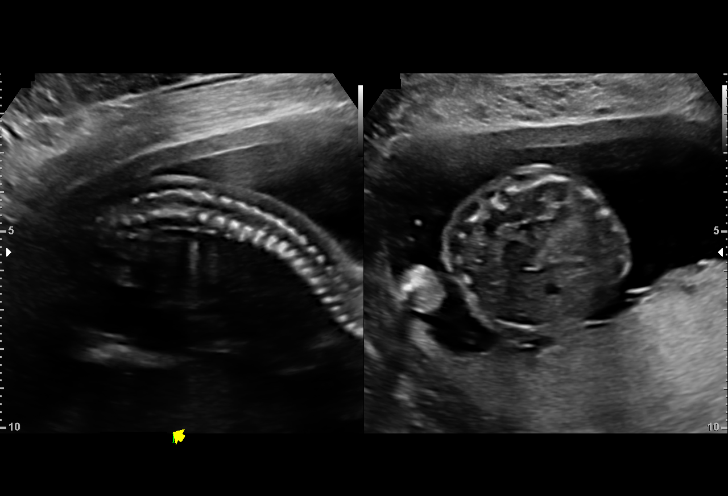
[im 19/99]
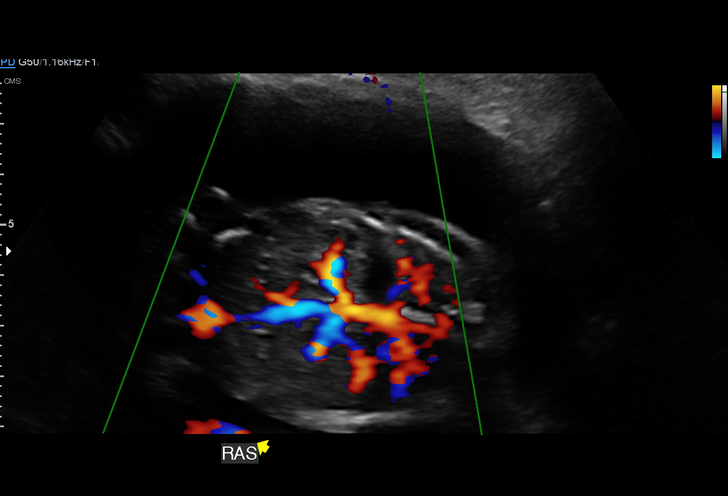
[im 26/99]
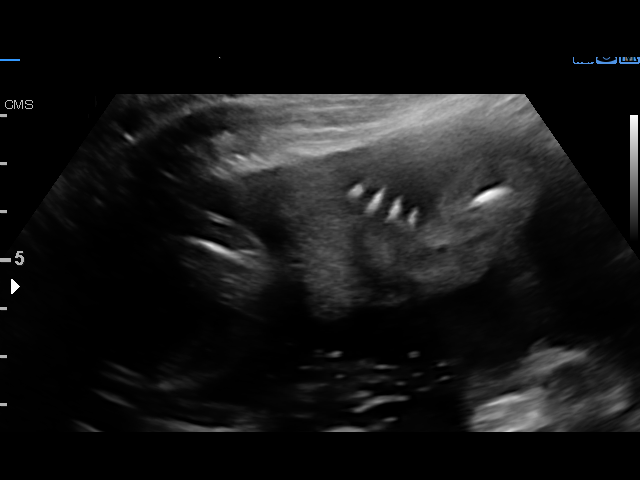
[im 33/99]
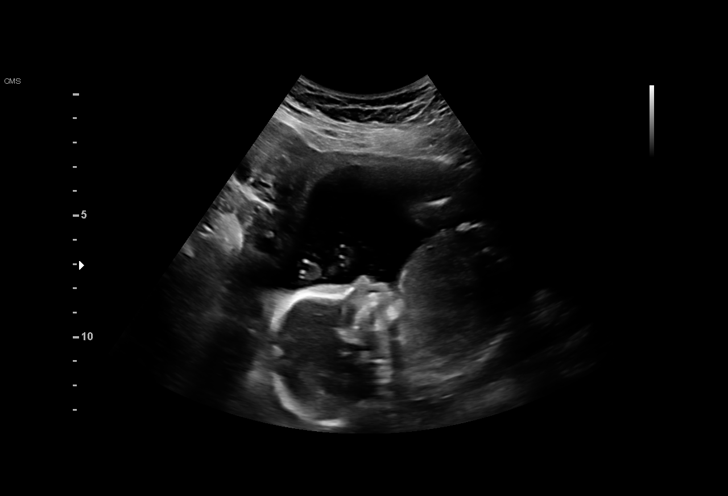
[im 40/99]
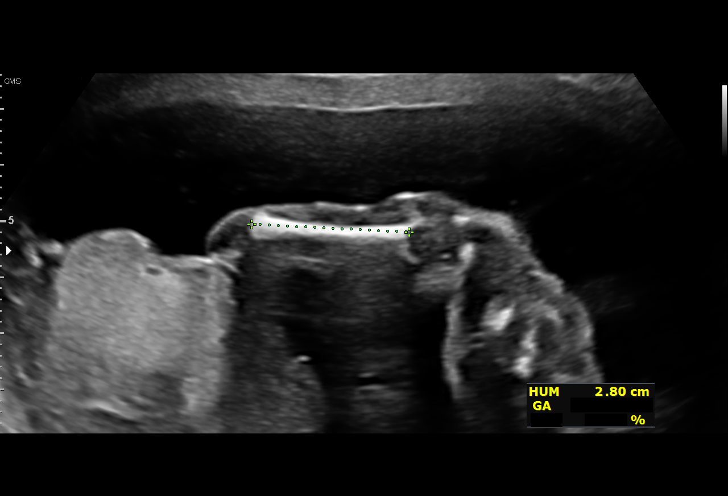
[im 51/99]
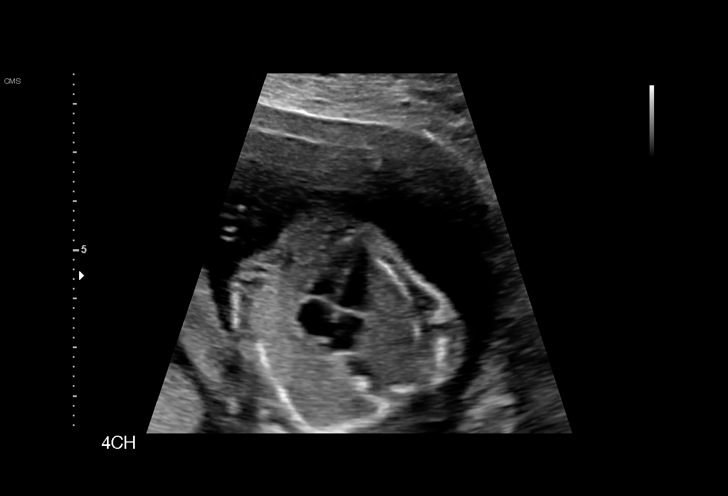
[im 59/99]
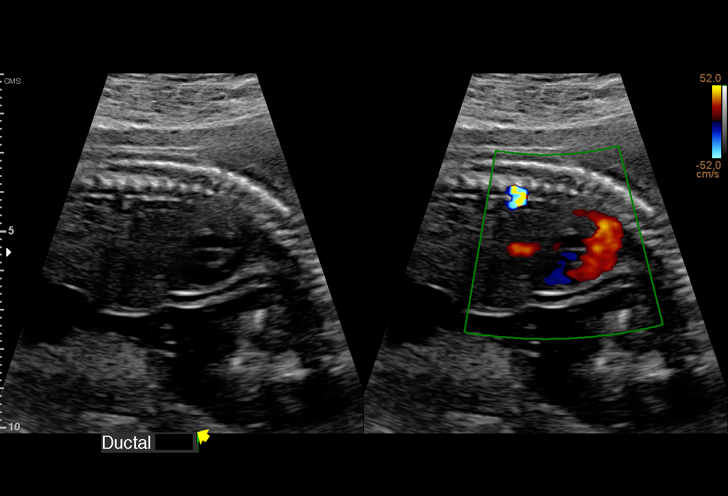
[im 66/99]
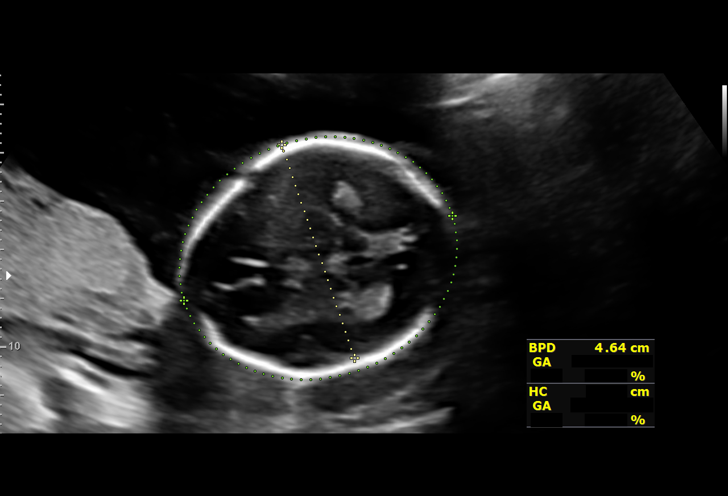
[im 73/99]
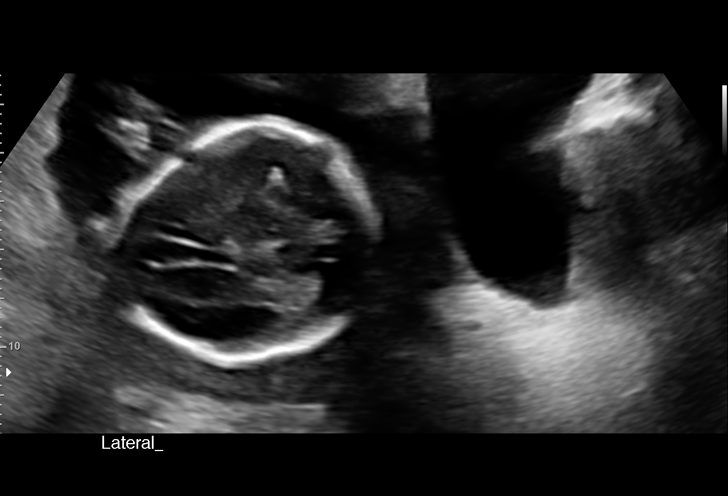
[im 80/99]
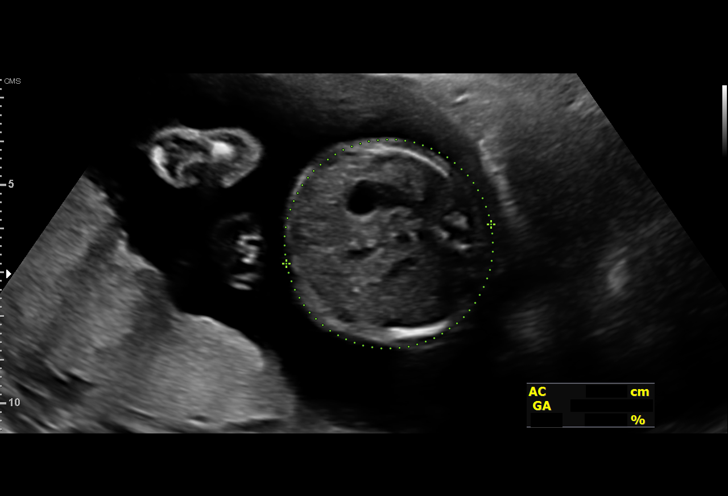
[im 88/99]
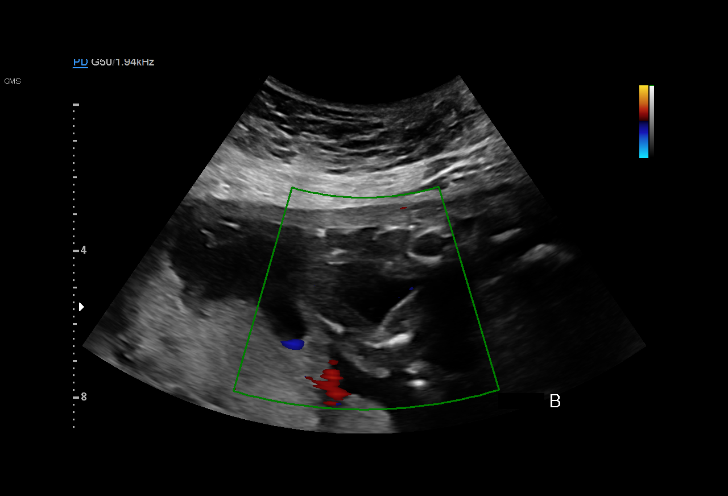
[im 95/99]
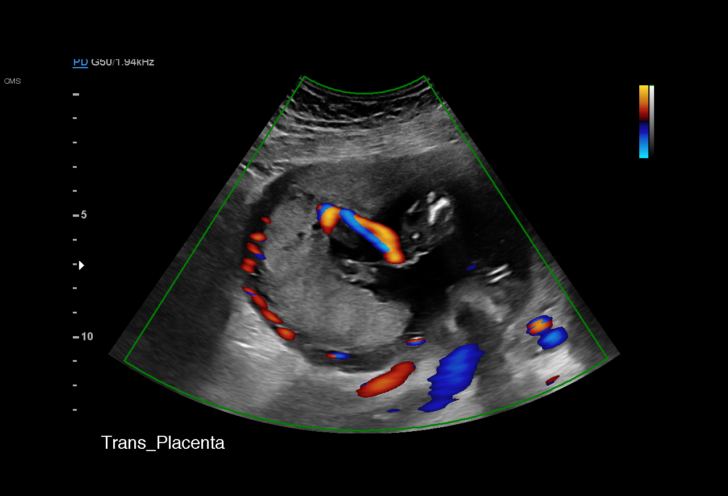

[13 of 28 positions shown; findings below may reference images not displayed]

----------------------------------------------------------------------

 ----------------------------------------------------------------------
Indications

  Encounter for antenatal screening for
  malformations
  Twin pregnancy with loss (demise) of one       O30.009,
  fetus, antepartum (TRAP)
  Twin pregnancy, [place], second trimester
  Hypothyroid (Synthroid)
  19 weeks gestation of pregnancy
 ----------------------------------------------------------------------
Fetal Evaluation (Fetus A)

 Num Of Fetuses:         2
 Fetal Heart Rate(bpm):  153
 Cardiac Activity:       Observed
 Presentation:           Cephalic
 Placenta:               Posterior
 P. Cord Insertion:      Marginal insertion

 Amniotic Fluid
 AFI FV:      Within normal limits

                             Largest Pocket(cm)

Biometry (Fetus A)

 BPD:        47  mm     G. Age:  20w 1d         91  %    CI:        75.29   %    70 - 86
                                                         FL/HC:      16.2   %    16.1 -
 HC:      171.8  mm     G. Age:  19w 5d         78  %    HC/AC:      1.20        1.09 -
 AC:      143.3  mm     G. Age:  19w 5d         68  %    FL/BPD:     59.4   %
 FL:       27.9  mm     G. Age:  18w 4d         27  %    FL/AC:      19.5   %    20 - 24
 HUM:      27.3  mm     G. Age:  18w 5d         42  %
 CER:      19.4  mm     G. Age:  18w 5d         41  %
 CM:        3.2  mm

 Est. FW:     281  gm    0 lb 10 oz      60  %     FW Discordancy        0 \ %
OB History

 Gravidity:    1         Term:   0        Prem:   0        SAB:   0
 TOP:          0       Ectopic:  0        Living: 0
Gestational Age (Fetus A)

 LMP:           19w 0d        Date:  09/03/18                 EDD:   06/10/19
 U/S Today:     19w 4d                                        EDD:   06/06/19
 Best:          19w 0d     Det. By:  LMP  (09/03/18)          EDD:   06/10/19
Anatomy (Fetus A)

 Cranium:               Appears normal         LVOT:                   Appears normal
 Cavum:                 Appears normal         Aortic Arch:            Appears normal
 Ventricles:            Appears normal         Ductal Arch:            Appears normal
 Choroid Plexus:        Appears normal         Diaphragm:              Appears normal
 Cerebellum:            Appears normal         Stomach:                Appears normal, left
                                                                       sided
 Posterior Fossa:       Appears normal         Abdomen:                Appears normal
 Nuchal Fold:           Appears normal         Abdominal Wall:         Appears nml (cord
                                                                       insert, abd wall)
 Face:                  Appears normal         Cord Vessels:           Appears normal (3
                        (orbits and profile)                           vessel cord)
 Lips:                  Not well visualized    Kidneys:                Appear normal
 Palate:                Not well visualized    Bladder:                Appears normal
 Thoracic:              Appears normal         Spine:                  Appears normal
 Heart:                 Appears normal         Upper Extremities:      Appears normal
                        (4CH, axis, and
                        situs)
 RVOT:                  Appears normal         Lower Extremities:      Appears normal

 Other:  Heels and right open hand/5th digit visualized. Nasal bone visualized.
         Technically difficult due to fetal position.

Fetal Evaluation (Fetus B)

 Num Of Fetuses:         2
Gestational Age (Fetus B)

 LMP:           19w 0d        Date:  09/03/18                 EDD:   06/10/19
 Best:          19w 0d     Det. By:  LMP  (09/03/18)          EDD:   06/10/19
Cervix Uterus Adnexa

 Cervix
 Length:            3.3  cm.
 Normal appearance by transabdominal scan.

 Uterus
 No abnormality visualized.

 Left Ovary
 Not visualized.
 Right Ovary
 Within normal limits.

 Cul De Sac
 No free fluid seen.

 Adnexa
 No abnormality visualized.
Impression

 Normal interval growth.
 Monochorionic-diamniotic twin pregnancy with TRAP
 sequence.  Twin A is a pump twin.

 Twin A: Normal interval growth. Good fetal activity no
 evidence of hydrops or pleural/pericardial effusion.
 Possible marginal cord insertion. Will reasses at next visit. A
 uterine contraction was noted near the placental edge.

 Suboptimal views of the fetal anatomy iw

 Twin B again noted in f fundal location, anhydramnos. No
 blood flow noted.

 TRAP sequence is resolved

 I discussed the normal nature of today's ultrasound with Dr.
 Mohor and her husband.
Recommendations

 -An appointment was made for her to return in 4 weeks for
 fetal growth

## 2021-08-06 IMAGING — US US MFM OB FOLLOW-UP
1 series · 13 of 28 positions shown · non-contrast
Comparison: none

[Series 1: us mfm ob follow-up · 13 of 66 slices shown]
[im 3/66]
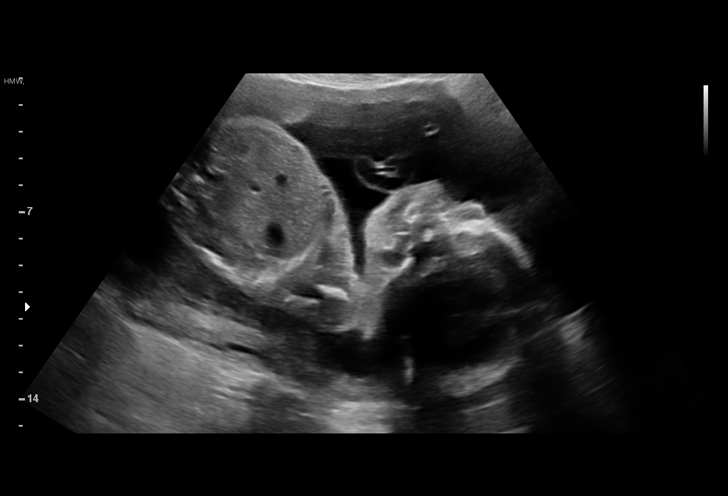
[im 8/66]
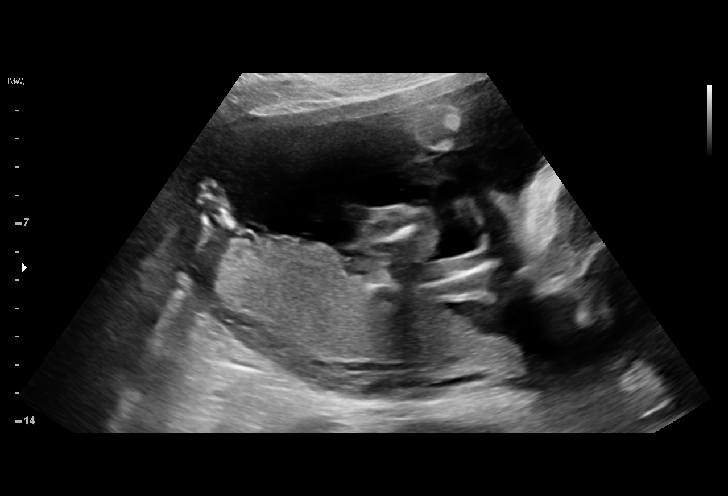
[im 13/66]
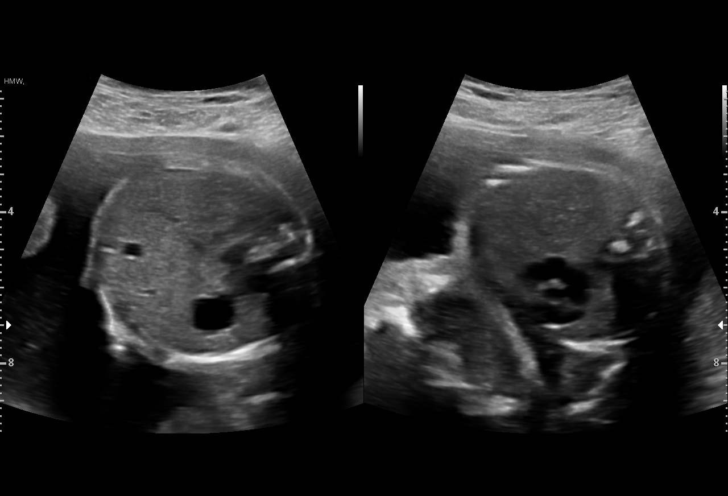
[im 17/66]
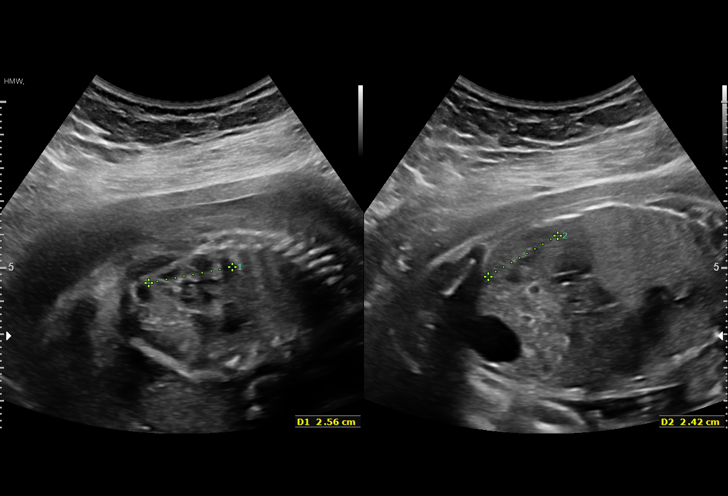
[im 22/66]
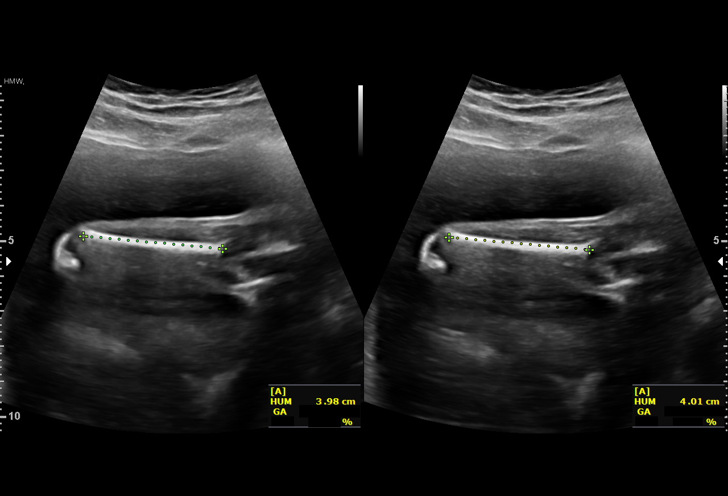
[im 27/66]
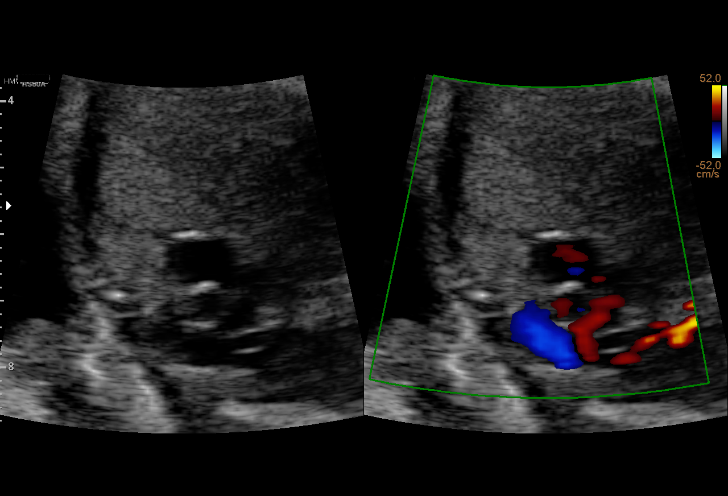
[im 34/66]
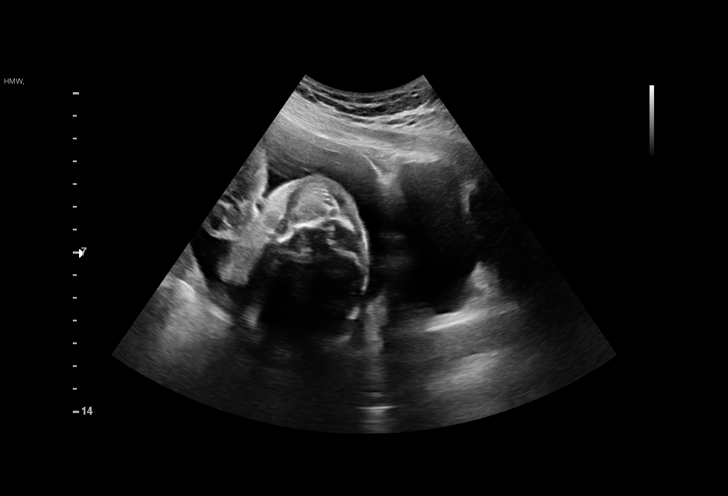
[im 39/66]
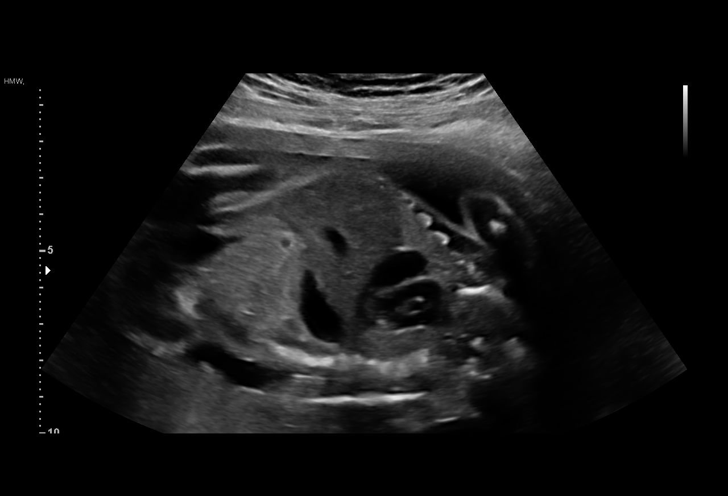
[im 44/66]
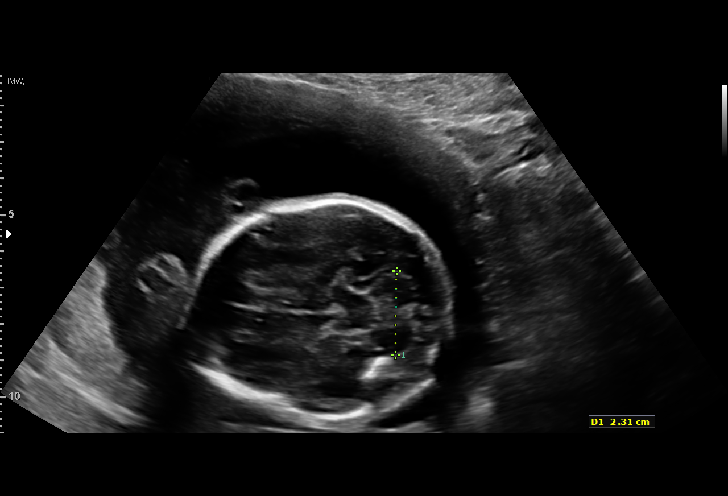
[im 49/66]
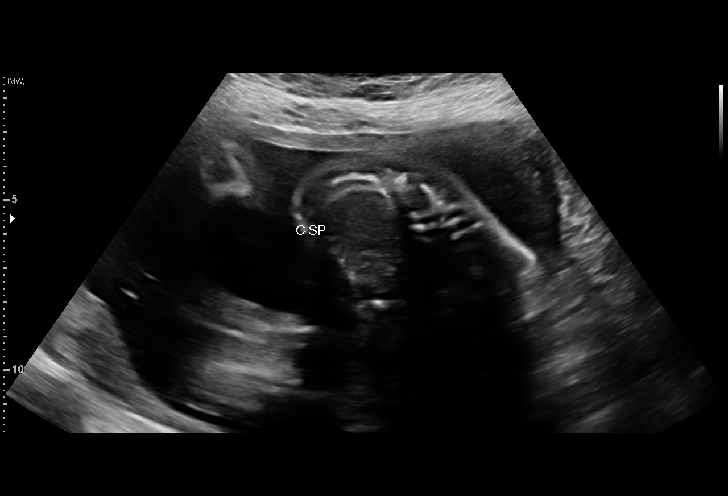
[im 53/66]
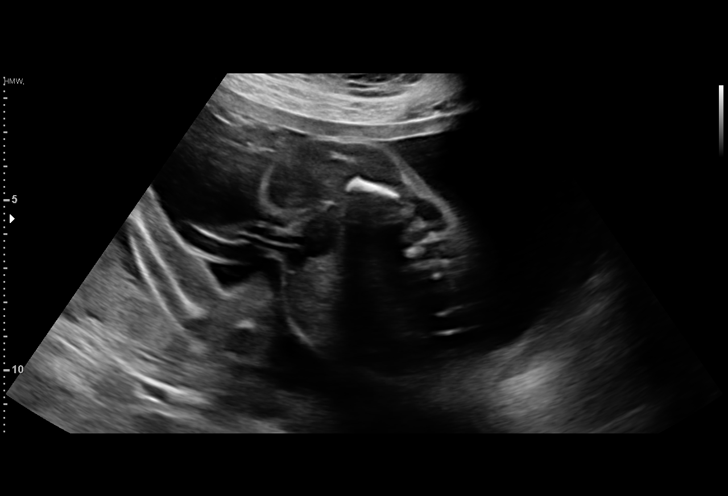
[im 58/66]
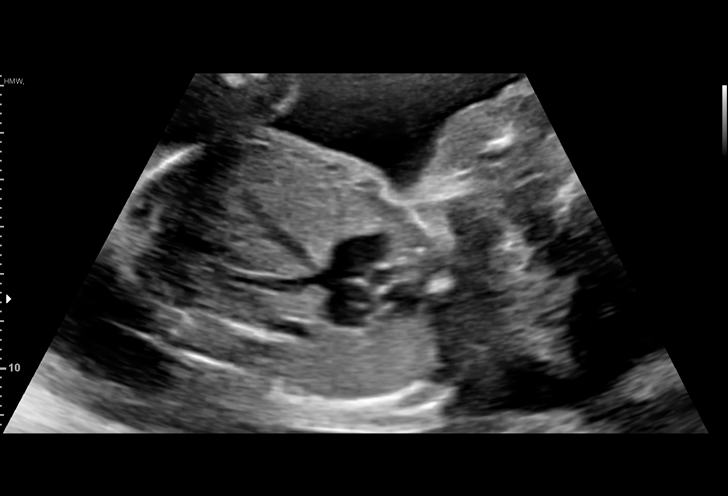
[im 63/66]
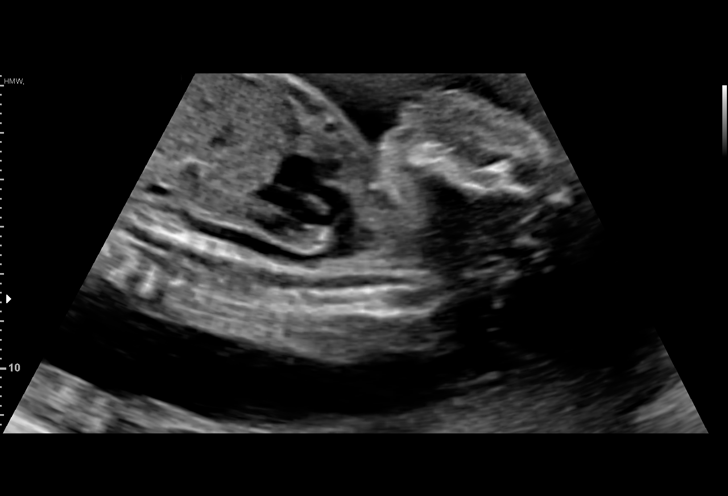

[13 of 28 positions shown; findings below may reference images not displayed]

PHUNG
 ----------------------------------------------------------------------

 ----------------------------------------------------------------------
Indications

  23 weeks gestation of pregnancy
  Encounter for antenatal screening for
  malformations
  Twin pregnancy with loss (demise) of one       O30.009,
  fetus, antepartum (TRAP)
  Twin pregnancy, Auntyjatty/Leal Fernandez, second trimester
  Hypothyroid (Synthroid)
  Advanced maternal age primigravida 35+,
  second trimester
 ----------------------------------------------------------------------
Fetal Evaluation (Fetus A)

 Num Of Fetuses:         2
 Cardiac Activity:       Observed
 Presentation:           Cephalic
 Placenta:               Posterior
 P. Cord Insertion:      Marginal insertion

 Amniotic Fluid
 AFI FV:      Within normal limits

                             Largest Pocket(cm)

Biometry (Fetus A)

 BPD:      58.5  mm     G. Age:  24w 0d         79  %    CI:         77.8   %    70 - 86
                                                         FL/HC:      18.7   %    19.2 -
 HC:      209.9  mm     G. Age:  23w 0d         38  %    HC/AC:      1.09        1.05 -
 AC:      192.6  mm     G. Age:  24w 0d         73  %    FL/BPD:     67.2   %    71 - 87
 FL:       39.3  mm     G. Age:  22w 5d         27  %    FL/AC:      20.4   %    20 - 24
 HUM:        40  mm     G. Age:  24w 2d         73  %

 Est. FW:     590  gm      1 lb 5 oz     62  %     FW Discordancy      0 \ 0 %
OB History

 Gravidity:    1         Term:   0        Prem:   0        SAB:   0
 TOP:          0       Ectopic:  0        Living: 0
Gestational Age (Fetus A)

 LMP:           23w 0d        Date:  09/03/18                 EDD:   06/10/19
 U/S Today:     23w 3d                                        EDD:   06/07/19
 Best:          23w 0d     Det. By:  LMP  (09/03/18)          EDD:   06/10/19
Anatomy (Fetus A)

 Cranium:               Appears normal         LVOT:                   Appears normal
 Cavum:                 Previously seen        Aortic Arch:            Previously seen
 Ventricles:            Previously seen        Ductal Arch:            Previously seen
 Choroid Plexus:        Previously seen        Diaphragm:              Previously seen
 Cerebellum:            Previously seen        Stomach:                Appears normal, left
                                                                       sided
 Posterior Fossa:       Previously seen        Abdomen:                Previously seen
 Nuchal Fold:           Previously seen        Abdominal Wall:         Previously seen
 Face:                  Orbits and profile     Cord Vessels:           Previously seen
                        previously seen
 Lips:                  Not well visualized    Kidneys:                Appear normal
 Palate:                Appears normal         Bladder:                Appears normal
 Thoracic:              Appears normal         Spine:                  Appears normal
 Heart:                 Previously seen        Upper Extremities:      Previously seen
 RVOT:                  Appears normal         Lower Extremities:      Previously seen

 Other:  Heels and right open hand/5th digit visualized previously. Nasal bone
         visualized previously. Technically difficult due to fetal position.

Fetal Evaluation (Fetus B)

 Num Of Fetuses:         2
 Cardiac Activity:       Absent
Biometry (Fetus B)

 CRL:      35.3  mm     G. Age:  10w 2d                  EDD:   09/07/19
Gestational Age (Fetus B)

 LMP:           23w 0d        Date:  09/03/18                 EDD:   06/10/19
 Best:          23w 0d     Det. By:  LMP  (09/03/18)          EDD:   06/10/19
Cervix Uterus Adnexa

 Cervix
 Length:            3.5  cm.
 Normal appearance by transabdominal scan.

 Uterus
 No abnormality visualized.
 Left Ovary
 No adnexal mass visualized.

 Right Ovary
 No adnexal mass visualized.

 Cul De Sac
 No free fluid seen.

 Adnexa
 No abnormality visualized.
Impression

 Monochorionic-diamniotic twin pregnancy with resolved
 TRAP sequence.  Patient return for completion of fetal
 anatomy.
 On cell-free fetal DNA screening the risks of fetal
 aneuploidies are not increased.  MSAFP screening showed
 increased risk for open spina bifida (1 and 10).  MSAFP was
 6.9 MoM.

 On today's ultrasound, fetal growth is appropriate for
 gestational age.  Amniotic fluid is normal and good fetal
 activity seen.  Fetal face appears normal.  Intracranial
 structures and fetal spine appear normal.  Cardiac anatomy
 appears normal.  Marginal cord insertion is seen again.

 Co-twin (resolving) with CRL measurement consistent with 10
 weeks' gestation is seen. No feeding vessels from surviving
 twin is seen.
 We reassured the patient of the findings.
Recommendations

 -An appointment was made for her to return in 5 weeks for
 fetal growth assessment.
 -We will set up an appointment for fetal echocardiography.
                      Ahern, Drippy

## 2021-08-07 ENCOUNTER — Encounter: Payer: Self-pay | Admitting: Obstetrics & Gynecology

## 2021-08-07 ENCOUNTER — Ambulatory Visit (INDEPENDENT_AMBULATORY_CARE_PROVIDER_SITE_OTHER): Payer: BLUE CROSS/BLUE SHIELD | Admitting: Obstetrics & Gynecology

## 2021-08-07 VITALS — BP 107/68 | HR 76

## 2021-08-07 DIAGNOSIS — Z30431 Encounter for routine checking of intrauterine contraceptive device: Secondary | ICD-10-CM | POA: Diagnosis not present

## 2021-08-07 NOTE — Progress Notes (Signed)
?  GYNECOLOGY OFFICE ENCOUNTER NOTE ? ?History:  ?41 y.o. [id number] here today for today for IUD string check; Liletta  IUD was placed  05/22/2021. No complaints about the IUD, no concerning side effects. ? ?The following portions of the patient's history were reviewed and updated as appropriate: allergies, current medications, past family history, past medical history, past social history, past surgical history and problem list. Last pap smear on 11/01/2018 was normal, negative HRHPV. ? ?Review of Systems:  ?Pertinent items are noted in HPI. ?  ?Objective:  ?Physical Exam ?Blood pressure 107/68, pulse 76, currently breastfeeding. ?CONSTITUTIONAL: Well-developed, well-nourished female in no acute distress.  ?HENT:  Normocephalic, atraumatic. External right and left ear normal. Oropharynx is clear and moist ?EYES: Conjunctivae and EOM are normal. Pupils are equal, round, and reactive to light. No scleral icterus.  ?NECK: Normal range of motion, supple, no masses ?CARDIOVASCULAR: Normal heart rate noted ?RESPIRATORY: Effort and breath sounds normal, no problems with respiration noted ?ABDOMEN: Soft, no distention noted.   ?PELVIC: Normal appearing external genitalia; normal appearing vaginal mucosa and cervix.  IUD strings visualized, about 3 cm in length outside cervix.  ? ?Assessment & Plan:  ?Patient to keep IUD in place for up to seven years; can come in for removal if she desires pregnancy earlier or for any concerning side effects. ?F/u in 10/2021 for annual or sooner prn  ? ?Countess Biebel L. Ihor Dow, MD, FACOG ?Obstetrician Social research officer, government, Faculty Practice ?[facility] ?  ?

## 2021-10-30 ENCOUNTER — Other Ambulatory Visit (HOSPITAL_COMMUNITY)
Admission: RE | Admit: 2021-10-30 | Discharge: 2021-10-30 | Disposition: A | Discharge status: Home / Self Care | Payer: BLUE CROSS/BLUE SHIELD | Source: Ambulatory Visit | Attending: Obstetrics & Gynecology | Admitting: Obstetrics & Gynecology

## 2021-10-30 ENCOUNTER — Encounter: Payer: Self-pay | Admitting: Obstetrics & Gynecology

## 2021-10-30 ENCOUNTER — Ambulatory Visit (INDEPENDENT_AMBULATORY_CARE_PROVIDER_SITE_OTHER): Payer: BLUE CROSS/BLUE SHIELD | Admitting: Obstetrics & Gynecology

## 2021-10-30 VITALS — BP 103/60 | HR 59 | Wt 210.0 lb

## 2021-10-30 DIAGNOSIS — Z01419 Encounter for gynecological examination (general) (routine) without abnormal findings: Secondary | ICD-10-CM | POA: Diagnosis present

## 2021-10-30 NOTE — Progress Notes (Signed)
Subjective:     Tina [name] is a 41 y.o. female here for a routine exam.  Current complaints: none. Pt is finally feeling settled with both kids.     Gynecologic History No LMP recorded. (Menstrual status: IUD). Contraception: IUD Last Pap: 11/01/2018. Results were: normal Last mammogram: n/a.   Obstetric History OB History  Gravida Para Term Preterm AB Living  '3 2 2   1 2  '$ SAB IAB Ectopic Multiple Live Births  1     0 2    # Outcome Date GA Lbr Len/2nd Weight Sex Delivery Anes PTL Lv  3 Term 04/19/21 67w6d07:23 / 00:19 7 lb 13.6 oz (3.561 kg) M Vag-Spont EPI  LIV  2 Term 06/03/19 362w0d8:04 / 02:14 6 lb 2.8 oz (2.801 kg) F Vag-Spont EPI  LIV  1 SAB              The following portions of the patient's history were reviewed and updated as appropriate: allergies, current medications, past family history, past medical history, past social history, past surgical history, and problem list.  Review of Systems Pertinent items are noted in HPI.    Objective:  BP 103/60   Pulse (!) 59   Wt 210 lb (95.3 kg)   Breastfeeding Yes   BMI 31.01 kg/m  General Appearance:    Alert, cooperative, no distress, appears stated age  Head:    Normocephalic, without obvious abnormality, atraumatic  Eyes:    conjunctiva/corneas clear, EOM's intact, both eyes  Ears:    Normal external ear canals, both ears  Nose:   Nares normal, septum midline, mucosa normal, no drainage    or sinus tenderness  Throat:   Lips, mucosa, and tongue normal; teeth and gums normal  Neck:   Supple, symmetrical, trachea midline, no adenopathy;    thyroid:  no enlargement/tenderness/nodules  Back:     Symmetric, no curvature, ROM normal, no CVA tenderness  Lungs:     respirations unlabored  Chest Wall:    No tenderness or deformity   Heart:    Regular rate and rhythm  Breast Exam:    No tenderness, masses, or nipple abnormality  Abdomen:     Soft, non-tender, bowel sounds active all four quadrants,    no masses,  no organomegaly  Genitalia:    Normal female without lesion, discharge or tenderness   IUD strings noted  Extremities:   Extremities normal, atraumatic, no cyanosis or edema  Pulses:   2+ and symmetric all extremities  Skin:   Skin color, texture, turgor normal, no rashes or lesions     Assessment:    Healthy female exam.    Plan:   Diagnoses and all orders for this visit:  Well woman exam -     Cytology - PAP( [facility])   F/u in 1 year or sooner prn   Emmauel Hallums L. Harraway-Smith, M.D., FACherlynn June

## 2021-11-05 LAB — CYTOLOGY - PAP
Chlamydia: NEGATIVE
Comment: NEGATIVE
Comment: NEGATIVE
Comment: NORMAL
Diagnosis: NEGATIVE
High risk HPV: NEGATIVE
Neisseria Gonorrhea: NEGATIVE

## 2021-11-12 IMAGING — US US MFM OB FOLLOW-UP
1 series · 13 of 28 positions shown · non-contrast
Comparison: none

[Series 1: us mfm ob follow-up · 13 of 37 slices shown]
[im 2/37]
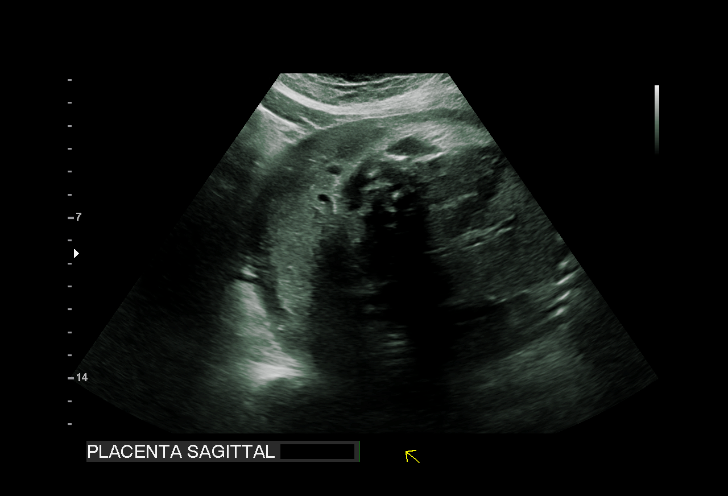
[im 5/37]
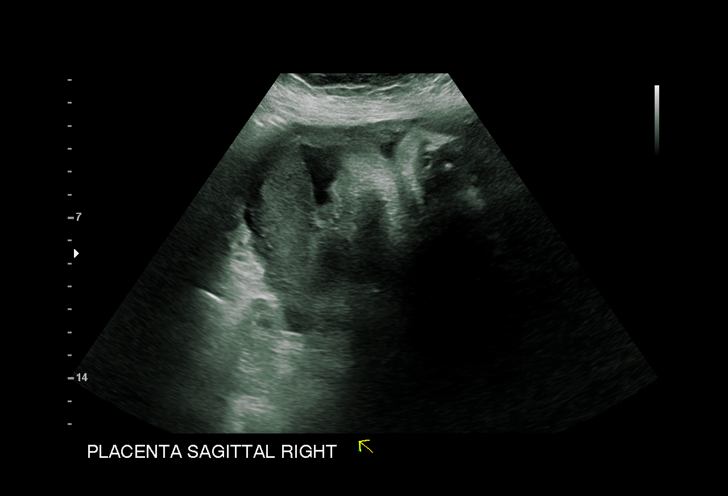
[im 7/37]
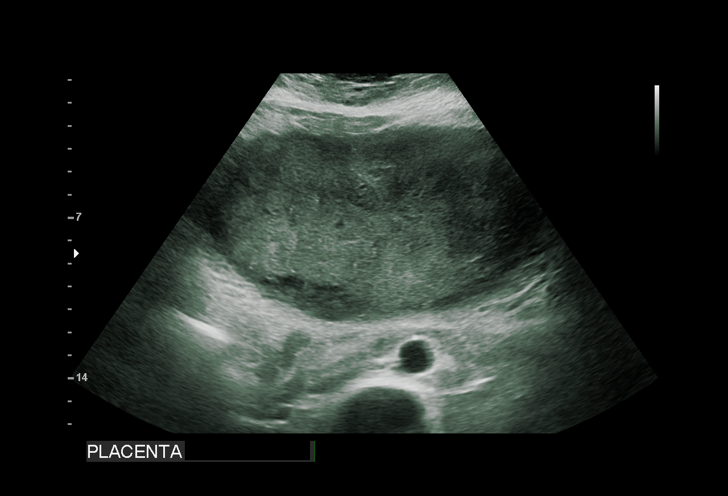
[im 10/37]
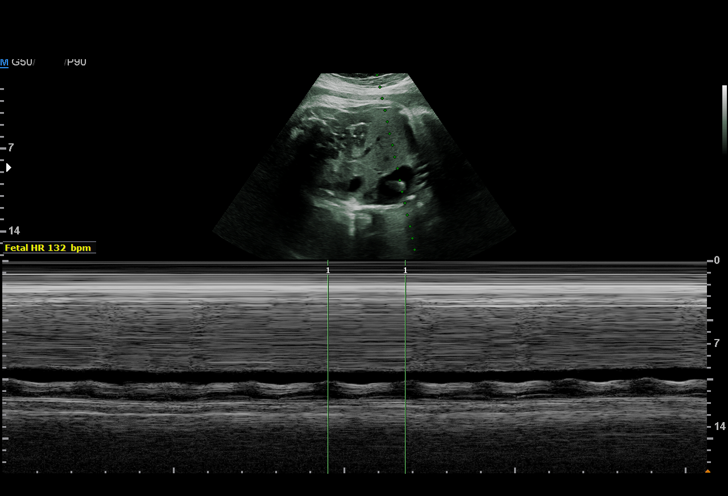
[im 13/37]
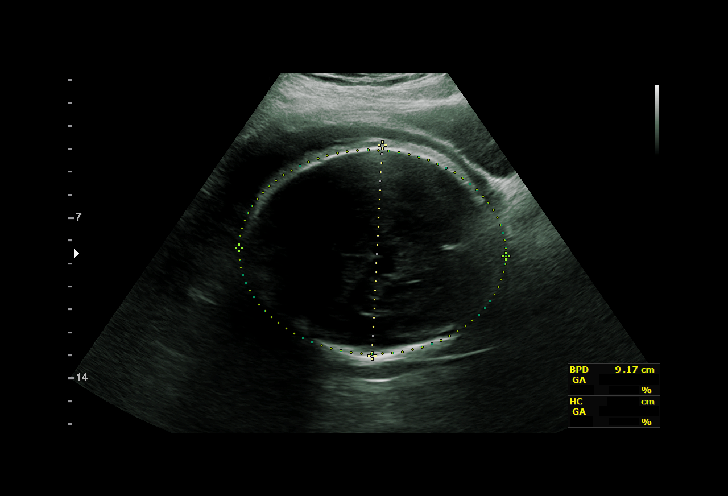
[im 15/37]
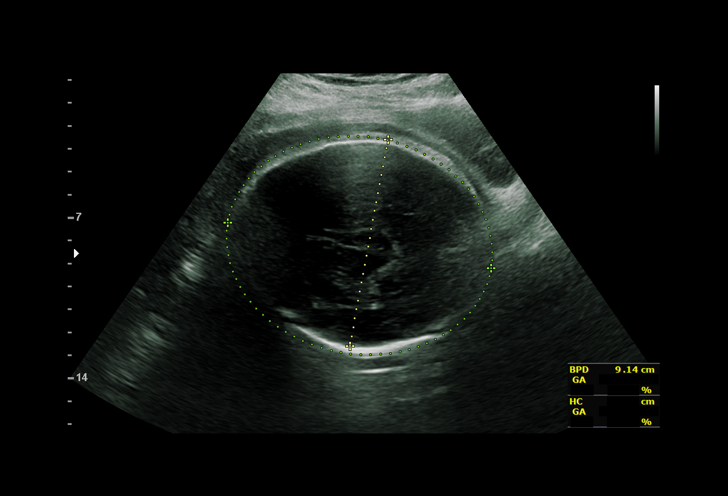
[im 19/37]
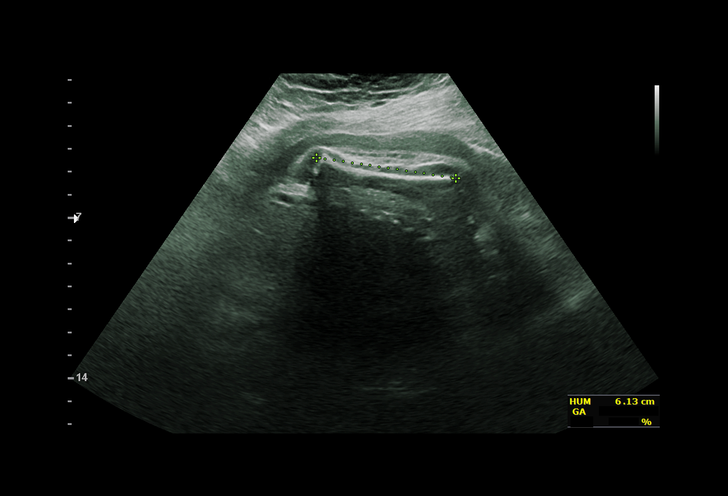
[im 22/37]
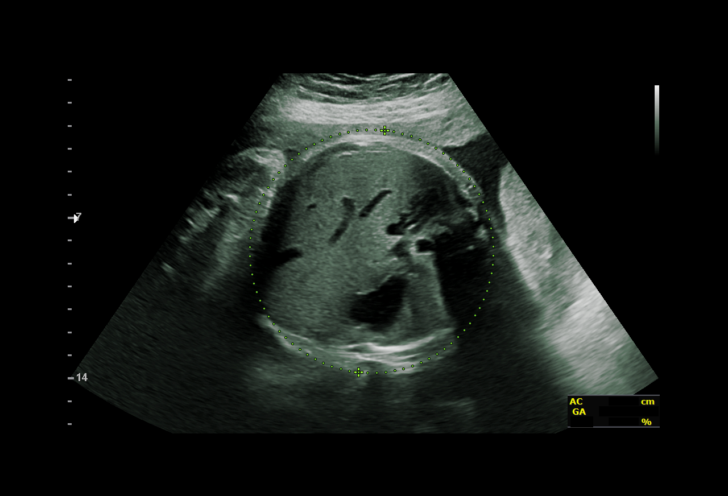
[im 25/37]
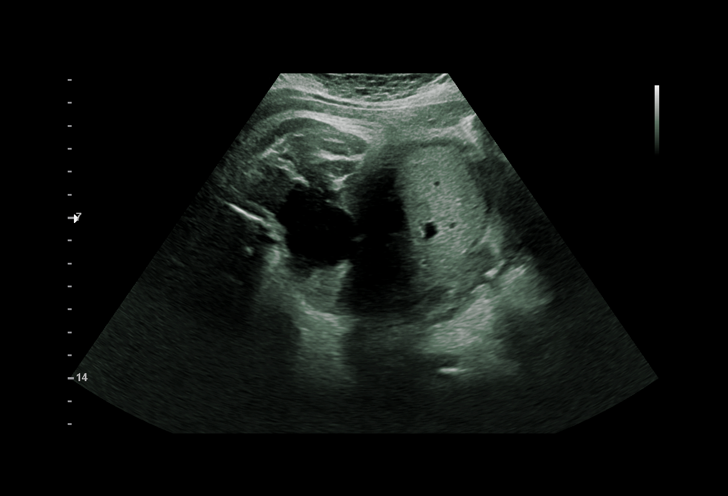
[im 27/37]
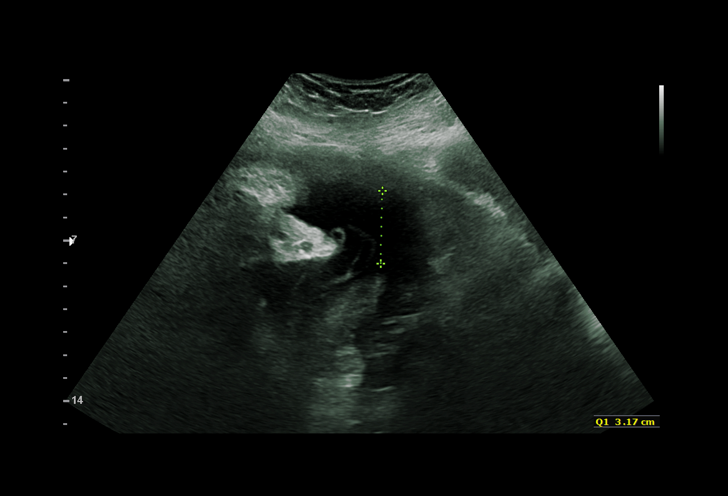
[im 30/37]
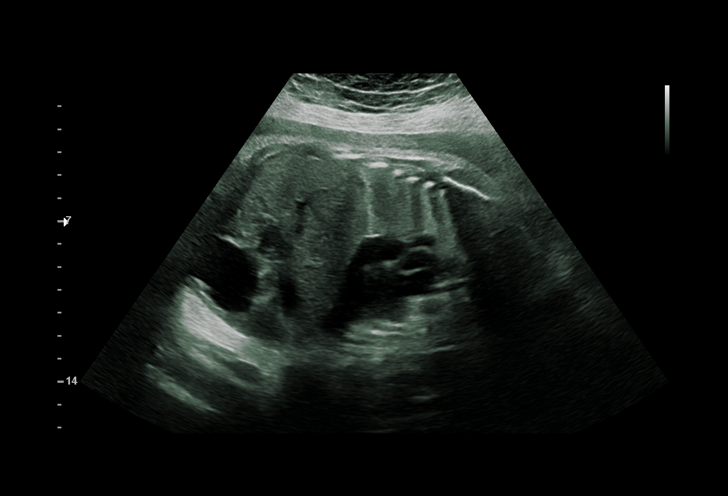
[im 33/37]
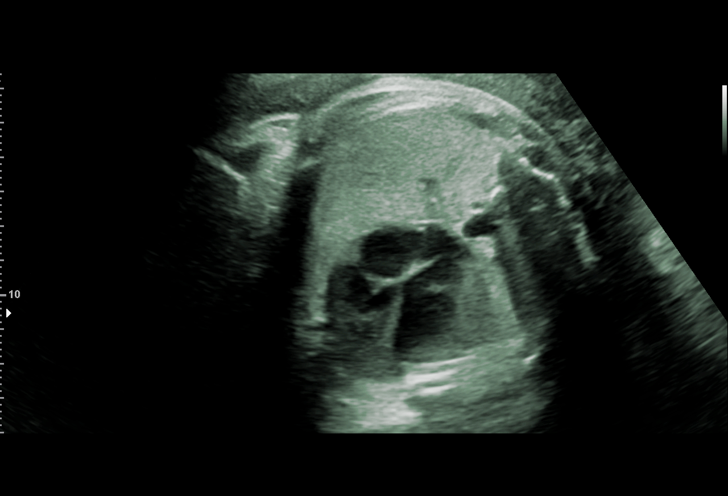
[im 35/37]
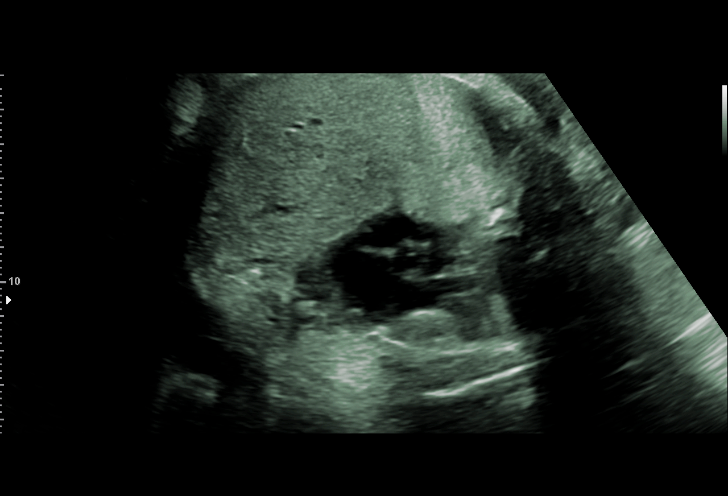

[13 of 28 positions shown; findings below may reference images not displayed]

----------------------------------------------------------------------

 ----------------------------------------------------------------------
Indications

  Twin pregnancy with loss (demise) of one       O30.009,
  fetus, antepartum (TRAP)
  37 weeks gestation of pregnancy
  Advanced maternal age primigravida 35+,
  third trimester
  Hypothyroid (Synthroid)
  Encounter for other antenatal screening
  follow-up
 ----------------------------------------------------------------------
Fetal Evaluation (Fetus A)

 Num Of Fetuses:         2
 Fetal Heart Rate(bpm):  132
 Cardiac Activity:       Observed
 Presentation:           Cephalic
 Placenta:               Posterior Fundal
 P. Cord Insertion:      Marginal insertion

 Amniotic Fluid
 AFI FV:      Subjectively low-normal

 AFI Sum(cm)     %Tile       Largest Pocket(cm)
 8.64            14

 RUQ(cm)                     LUQ(cm)        LLQ(cm)

Biometry (Fetus A)

 BPD:      91.5  mm     G. Age:  37w 1d         69  %    CI:        77.59   %    70 - 86
                                                         FL/HC:      21.7   %    20.8 -
 HC:      328.8  mm     G. Age:  37w 3d         32  %    HC/AC:      0.99        0.92 -
 AC:      332.7  mm     G. Age:  37w 1d         69  %    FL/BPD:     78.0   %    71 - 87
 FL:       71.4  mm     G. Age:  36w 4d         38  %    FL/AC:      21.5   %    20 - 24
 HUM:        62  mm     G. Age:  35w 6d         50  %

 Est. FW:    8148  gm    6 lb 13 oz      58  %     FW Discordancy        0 \ %
OB History

 Gravidity:    1         Term:   0        Prem:   0        SAB:   0
 TOP:          0       Ectopic:  0        Living: 0
Gestational Age (Fetus A)

 LMP:           37w 0d        Date:  09/03/18                 EDD:   06/10/19
 U/S Today:     37w 1d                                        EDD:   06/09/19
 Best:          37w 0d     Det. By:  LMP  (09/03/18)          EDD:   06/10/19
Anatomy (Fetus A)

 Cranium:               Appears normal         Aortic Arch:            Previously seen
 Cavum:                 Appears normal         Ductal Arch:            Previously seen
 Ventricles:            Previously seen        Diaphragm:              Appears normal
 Choroid Plexus:        Previously seen        Stomach:                Appears normal, left
                                                                       sided
 Cerebellum:            Previously seen        Abdomen:                Appears normal
 Posterior Fossa:       Previously seen        Abdominal Wall:         Previously seen
 Nuchal Fold:           Not applicable (>20    Cord Vessels:           Appears normal (3
                        wks GA)                                        vessel cord)
 Face:                  Orbits and profile     Kidneys:                Appear normal
                        previously seen
 Lips:                  Previously seen        Bladder:                Appears normal
 Thoracic:              Appears normal         Spine:                  Previously seen
 Heart:                 Appears normal         Upper Extremities:      Previously seen
                        (4CH, axis, and
                        situs)
 RVOT:                  Appears normal         Lower Extremities:      Previously seen
 LVOT:                  Appears normal

Fetal Evaluation (Fetus B)

 Num Of Fetuses:         2
Gestational Age (Fetus B)

 LMP:           37w 0d        Date:  09/03/18                 EDD:   06/10/19
 Best:          37w 0d     Det. By:  LMP  (09/03/18)          EDD:   06/10/19
Cervix Uterus Adnexa

 Cervix
 Not visualized (advanced GA >54wks)
Impression

 Marginal cord insertion. Maternal hypothyroidism on
 levothyroxine supplements. Euthyroid now.
 Fetal growth is appropriate for gestational age. Amniotic fluid
 is normal and good fetal activity is seen. I could not ascertain
 cord insertion because of fetal position and posterior
 placenta. Cephalic presentation.
 Acardiac twin is not seen.
 We reassured the patient of the findings.
Recommendations

 Follow-up scans as clinically indicated.
                 Jumper, Nya

## 2021-11-13 ENCOUNTER — Encounter: Payer: Self-pay | Admitting: Obstetrics & Gynecology

## 2022-01-13 ENCOUNTER — Ambulatory Visit (INDEPENDENT_AMBULATORY_CARE_PROVIDER_SITE_OTHER): Payer: BLUE CROSS/BLUE SHIELD | Admitting: *Deleted

## 2022-01-13 DIAGNOSIS — Z23 Encounter for immunization: Secondary | ICD-10-CM

## 2022-01-13 NOTE — Progress Notes (Signed)
Flu vaccine given Rt deltoid IM

## 2022-07-22 ENCOUNTER — Encounter: Payer: Self-pay | Admitting: Internal Medicine

## 2022-08-08 ENCOUNTER — Other Ambulatory Visit (HOSPITAL_BASED_OUTPATIENT_CLINIC_OR_DEPARTMENT_OTHER): Payer: Self-pay

## 2022-08-08 ENCOUNTER — Ambulatory Visit (HOSPITAL_BASED_OUTPATIENT_CLINIC_OR_DEPARTMENT_OTHER)
Admission: RE | Admit: 2022-08-08 | Discharge: 2022-08-08 | Disposition: A | Discharge status: Home / Self Care | Payer: BLUE CROSS/BLUE SHIELD | Source: Ambulatory Visit | Attending: Obstetrics & Gynecology | Admitting: Obstetrics & Gynecology

## 2022-08-08 DIAGNOSIS — Z1231 Encounter for screening mammogram for malignant neoplasm of breast: Secondary | ICD-10-CM

## 2022-08-18 ENCOUNTER — Other Ambulatory Visit: Payer: Self-pay

## 2022-08-18 DIAGNOSIS — E038 Other specified hypothyroidism: Secondary | ICD-10-CM

## 2022-08-18 MED ORDER — LEVOTHYROXINE SODIUM 112 MCG PO TABS: 112.0000 ug | ORAL_TABLET | Freq: Every day | ORAL | 3 refills | Status: AC

## 2022-08-18 NOTE — Progress Notes (Signed)
Refill request sent from [facility]

## 2022-08-20 DIAGNOSIS — Z1231 Encounter for screening mammogram for malignant neoplasm of breast: Secondary | ICD-10-CM

## 2022-08-25 ENCOUNTER — Ambulatory Visit (AMBULATORY_SURGERY_CENTER): Payer: BLUE CROSS/BLUE SHIELD

## 2022-08-25 VITALS — Ht 69.0 in | Wt 195.0 lb

## 2022-08-25 DIAGNOSIS — Z86010 Personal history of colonic polyps: Secondary | ICD-10-CM

## 2022-08-25 MED ORDER — NA SULFATE-K SULFATE-MG SULF 17.5-3.13-1.6 GM/177ML PO SOLN: 1.0000 | Freq: Once | ORAL | 0 refills | Status: AC

## 2022-08-25 NOTE — Progress Notes (Signed)
No egg or soy allergy known to patient  No issues known to pt with past sedation with any surgeries or procedures Patient denies ever being told they had issues or difficulty with intubation  No FH of Malignant Hyperthermia Pt is not on diet pills Pt is not on  home 02  Pt is not on blood thinners  Pt reports constipation but controlled with miralax and metamucil  No A fib or A flutter Have any cardiac testing pending--no  Pt denies any mobility issues   Patient's chart reviewed by Cathlyn Parsons CNRA prior to previsit and patient appropriate for the [facility].  Previsit completed and red dot placed by patient's name on their procedure day (on provider's schedule).      PV complete. Prep reviewed with patient. Instructions sent via mychart to and to address on file. Rx sent to pt preferred pharmacy on file. Pt instructed to use [email or web address] or GoodRx for a price reduction on prep

## 2022-09-11 ENCOUNTER — Encounter: Payer: Self-pay | Admitting: Internal Medicine

## 2022-09-18 ENCOUNTER — Encounter: Payer: Self-pay | Admitting: Certified Registered Nurse Anesthetist

## 2022-09-24 ENCOUNTER — Ambulatory Visit (AMBULATORY_SURGERY_CENTER): Payer: BLUE CROSS/BLUE SHIELD | Admitting: Internal Medicine

## 2022-09-24 ENCOUNTER — Encounter: Payer: Self-pay | Admitting: Internal Medicine

## 2022-09-24 ENCOUNTER — Encounter: Payer: BLUE CROSS/BLUE SHIELD | Admitting: Internal Medicine

## 2022-09-24 VITALS — BP 99/52 | HR 53 | Resp 15

## 2022-09-24 DIAGNOSIS — Z09 Encounter for follow-up examination after completed treatment for conditions other than malignant neoplasm: Secondary | ICD-10-CM

## 2022-09-24 DIAGNOSIS — Z86010 Personal history of colonic polyps: Secondary | ICD-10-CM | POA: Diagnosis not present

## 2022-09-24 MED ORDER — SODIUM CHLORIDE 0.9 % IV SOLN: 500.0000 mL | Freq: Once | INTRAVENOUS | Status: DC

## 2022-09-24 NOTE — Patient Instructions (Signed)
Resume your regular medications today.  Read the handouts given to you by your recovery room nurse.  YOU HAD AN ENDOSCOPIC PROCEDURE TODAY AT THE [facility]:   Refer to the procedure report that was given to you for any specific questions about what was found during the examination.  If the procedure report does not answer your questions, please call your gastroenterologist to clarify.  If you requested that your care partner not be given the details of your procedure findings, then the procedure report has been included in a sealed envelope for you to review at your convenience later.  YOU SHOULD EXPECT: Some feelings of bloating in the abdomen. Passage of more gas than usual.  Walking can help get rid of the air that was put into your GI tract during the procedure and reduce the bloating. If you had a lower endoscopy (such as a colonoscopy or flexible sigmoidoscopy) you may notice spotting of blood in your stool or on the toilet paper. If you underwent a bowel prep for your procedure, you may not have a normal bowel movement for a few days.  Please Note:  You might notice some irritation and congestion in your nose or some drainage.  This is from the oxygen used during your procedure.  There is no need for concern and it should clear up in a day or so.  SYMPTOMS TO REPORT IMMEDIATELY:  Following lower endoscopy (colonoscopy or flexible sigmoidoscopy):  Excessive amounts of blood in the stool  Significant tenderness or worsening of abdominal pains  Swelling of the abdomen that is new, acute  Fever of 100F or higher   For urgent or emergent issues, a gastroenterologist can be reached at any hour by calling [phone]. Do not use MyChart messaging for urgent concerns.    DIET:  We do recommend a small meal at first, but then you may proceed to your regular diet.  Drink plenty of fluids but you should avoid alcoholic beverages for 24 hours.  ACTIVITY:  You should plan to take  it easy for the rest of today and you should NOT DRIVE or use heavy machinery until tomorrow (because of the sedation medicines used during the test).    FOLLOW UP: Our staff will call the number listed on your records the next business day following your procedure.  We will call around 7:15- 8:00 am to check on you and address any questions or concerns that you may have regarding the information given to you following your procedure. If we do not reach you, we will leave a message.      SIGNATURES/CONFIDENTIALITY: You and/or your care partner have signed paperwork which will be entered into your electronic medical record.  These signatures attest to the fact that that the information above on your After Visit Summary has been reviewed and is understood.  Full responsibility of the confidentiality of this discharge information lies with you and/or your care-partner.

## 2022-09-24 NOTE — Progress Notes (Signed)
Report given to PACU, vss 

## 2022-09-24 NOTE — Op Note (Signed)
[facility] Patient Name: Tina [name] Procedure Date: 09/24/2022 1:38 PM MRN: [id number] Endoscopist: Beverley Fiedler , MD, [id number] Age: 42 Referring MD:  Date of Birth: 1980/11/23 Gender: Female Account #: [id number] Procedure:                Colonoscopy Indications:              High risk colon cancer surveillance: Personal                            history of non-advanced adenoma, Last colonoscopy:                            April 2019; family hx of colonic polyps (>10                            polyps) in patient's sister Medicines:                Monitored Anesthesia Care Procedure:                Pre-Anesthesia Assessment:                           - Prior to the procedure, a History and Physical                            was performed, and patient medications and                            allergies were reviewed. The patient's tolerance of                            previous anesthesia was also reviewed. The risks                            and benefits of the procedure and the sedation                            options and risks were discussed with the patient.                            All questions were answered, and informed consent                            was obtained. Prior Anticoagulants: The patient has                            taken no anticoagulant or antiplatelet agents. ASA                            Grade Assessment: II - A patient with mild systemic                            disease. After reviewing the risks and benefits,  the patient was deemed in satisfactory condition to                            undergo the procedure.                           After obtaining informed consent, the colonoscope                            was passed under direct vision. Throughout the                            procedure, the patient's blood pressure, pulse, and                            oxygen saturations were monitored  continuously. The                            CF [id number] #[id number] was introduced through the anus                            and advanced to the terminal ileum. The colonoscopy                            was performed without difficulty. The patient                            tolerated the procedure well. The quality of the                            bowel preparation was good. The terminal ileum,                            ileocecal valve, appendiceal orifice, and rectum                            were photographed. Scope In: 1:44:00 PM Scope Out: 2:05:09 PM Scope Withdrawal Time: 0 hours 15 minutes 0 seconds  Total Procedure Duration: 0 hours 21 minutes 9 seconds  Findings:                 The digital rectal exam was normal.                           The terminal ileum appeared normal.                           The entire examined colon appeared normal on direct                            and retroflexion views. Complications:            No immediate complications. Estimated Blood Loss:     Estimated blood loss: none. Impression:               - The examined portion of the ileum  was normal.                           - The entire examined colon is normal on direct and                            retroflexion views.                           - No specimens collected. Recommendation:           - Patient has a contact number available for                            emergencies. The signs and symptoms of potential                            delayed complications were discussed with the                            patient. Return to normal activities tomorrow.                            Written discharge instructions were provided to the                            patient.                           - Resume previous diet.                           - Continue present medications.                           - Repeat colonoscopy in 5 years for surveillance                            and in setting  of family history of colonic polyps. Beverley Fiedler, MD 09/24/2022 2:09:32 PM This report has been signed electronically.

## 2022-09-24 NOTE — Progress Notes (Signed)
GASTROENTEROLOGY PROCEDURE H&P NOTE   Primary Care Physician: Gilda Crease, MD    Reason for Procedure:  History of adenomatous colon polyp  Plan:    Colonoscopy  Patient is appropriate for endoscopic procedure(s) in the ambulatory ([facility]) setting.  The nature of the procedure, as well as the risks, benefits, and alternatives were carefully and thoroughly reviewed with the patient. Ample time for discussion and questions allowed. The patient understood, was satisfied, and agreed to proceed.     HPI: Tina [name] is a 42 y.o. female who presents for colonoscopy.  Medical history as below.  Tolerated the prep.  No recent chest pain or shortness of breath.  No abdominal pain today.  Past Medical History:  Diagnosis Date   Hypothyroidism, Levothyroxine only 03/15/2017   Mirena IUD (intrauterine device) in place 03/15/2017    Past Surgical History:  Procedure Laterality Date   COLONOSCOPY      Prior to Admission medications   Medication Sig Start Date End Date Taking? Authorizing Provider  acetaminophen (TYLENOL) 500 MG tablet Take 1,000 mg by mouth every 6 (six) hours as needed for mild pain or headache.   Yes [provider]  Cholecalciferol (VITAMIN D3 PO) Take 5,600 Units by mouth daily.   Yes [provider]  levothyroxine (SYNTHROID) 112 MCG tablet Take 1 tablet (112 mcg total) by mouth daily. 08/18/22  Yes Harraway-Smith, Eber Jones, MD  polyethylene glycol (MIRALAX / GLYCOLAX) 17 g packet Take 17 g by mouth daily.   Yes [provider]  Prenatal Vit-Fe Fumarate-FA (PRENATAL MULTIVITAMIN) TABS tablet Take 1 tablet by mouth at bedtime.    Yes [provider]  Psyllium (METAMUCIL PO) Take 1 Scoop by mouth daily. 1 tablespoon.   Yes [provider]  levonorgestrel (MIRENA, 52 MG,) 20 MCG/DAY IUD 1 each by Intrauterine route once. 05/15/14   [provider]  valACYclovir (VALTREX) 500 MG tablet Take 1 tablet (500 mg  total) by mouth 2 (two) times daily. 07/02/21   Willodean Rosenthal, MD    Current Outpatient Medications  Medication Sig Dispense Refill   acetaminophen (TYLENOL) 500 MG tablet Take 1,000 mg by mouth every 6 (six) hours as needed for mild pain or headache.     Cholecalciferol (VITAMIN D3 PO) Take 5,600 Units by mouth daily.     levothyroxine (SYNTHROID) 112 MCG tablet Take 1 tablet (112 mcg total) by mouth daily. 90 tablet 3   polyethylene glycol (MIRALAX / GLYCOLAX) 17 g packet Take 17 g by mouth daily.     Prenatal Vit-Fe Fumarate-FA (PRENATAL MULTIVITAMIN) TABS tablet Take 1 tablet by mouth at bedtime.      Psyllium (METAMUCIL PO) Take 1 Scoop by mouth daily. 1 tablespoon.     levonorgestrel (MIRENA, 52 MG,) 20 MCG/DAY IUD 1 each by Intrauterine route once.     valACYclovir (VALTREX) 500 MG tablet Take 1 tablet (500 mg total) by mouth 2 (two) times daily. 90 tablet 3   Current Facility-Administered Medications  Medication Dose Route Frequency Provider Last Rate Last Admin   0.9 %  sodium chloride infusion  500 mL Intravenous Once Cailen Mihalik, Carie Caddy, MD        Allergies as of 09/24/2022 - Review Complete 09/24/2022  Allergen Reaction Noted   Augmentin [amoxicillin-pot clavulanate] Rash 05/18/2018   Lidocaine Itching 02/27/2014   Penicillins Other (See Comments) 10/10/2015    Family History  Problem Relation Age of Onset   Cancer Mother    Arthritis Mother  Leukemia Mother    Colon polyps Sister    Other Sister        accessory pathways in heart   Stroke Maternal Grandmother    Stroke Paternal Grandmother    Stroke Paternal Grandfather    Colon cancer Neg Hx    Rectal cancer Neg Hx    Stomach cancer Neg Hx     Social History   Socioeconomic History   Marital status: Married    Spouse name: Public librarian   Number of children: 1   Years of education: Not on file   Highest education level: Not on file  Occupational History   Not on file  Tobacco Use   Smoking status:  Never   Smokeless tobacco: Never  Vaping Use   Vaping Use: Never used  Substance and Sexual Activity   Alcohol use: Not Currently    Comment: very rarely   Drug use: No   Sexual activity: Yes    Partners: Male    Birth control/protection: None  Other Topics Concern   Not on file  Social History Narrative   Not on file   Social Determinants of Health   Financial Resource Strain: Not on file  Food Insecurity: Not on file  Transportation Needs: Not on file  Physical Activity: Not on file  Stress: Not on file  Social Connections: Not on file  Intimate Partner Violence: Not on file    Physical Exam: Vital signs in last 24 hours: @There  were no vitals taken for this visit. GEN: NAD EYE: Sclerae anicteric ENT: MMM CV: Non-tachycardic Pulm: CTA b/l GI: Soft, NT/ND NEURO:  Alert & Oriented x 3   Erick Blinks, MD [facility]  09/24/2022 1:16 PM

## 2022-09-25 ENCOUNTER — Telehealth: Payer: Self-pay

## 2022-09-25 NOTE — Telephone Encounter (Signed)
Follow up call to pt, lm for pt to call if having any difficulty with normal activities or eating and drinking; Also stated to call if any other questions or concerns.

## 2022-11-12 ENCOUNTER — Encounter: Payer: Self-pay | Admitting: Obstetrics & Gynecology

## 2022-11-12 ENCOUNTER — Ambulatory Visit (INDEPENDENT_AMBULATORY_CARE_PROVIDER_SITE_OTHER): Payer: BLUE CROSS/BLUE SHIELD | Admitting: Obstetrics & Gynecology

## 2022-11-12 VITALS — BP 103/55 | HR 60 | Wt 204.0 lb

## 2022-11-12 DIAGNOSIS — Z01419 Encounter for gynecological examination (general) (routine) without abnormal findings: Secondary | ICD-10-CM | POA: Diagnosis not present

## 2022-11-12 NOTE — Progress Notes (Signed)
Subjective:     Tina [name] is a 42 y.o. female here for a routine exam.  Current complaints: no GYN complaints. Has LnIUD. 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 Has 2 oral presentations next week. Recently visited [place] with family.       Gynecologic History No LMP recorded. (Menstrual status: IUD). Contraception: IUD Last Pap: 10/30/2021. Results were: normal Last mammogram: 08/08/2022 Results were: normal  Obstetric History OB History  Gravida Para Term Preterm AB Living  3 2 2   1 2   SAB IAB Ectopic Multiple Live Births  1     0 2    # Outcome Date GA Lbr Len/2nd Weight Sex Type Anes PTL Lv  3 Term 04/19/21 [redacted]w[redacted]d 07:23 / 00:19 7 lb 13.6 oz (3.561 kg) M Vag-Spont EPI  LIV  2 Term 06/03/19 [redacted]w[redacted]d 08:04 / 02:14 6 lb 2.8 oz (2.801 kg) F Vag-Spont EPI  LIV  1 SAB              The following portions of the patient's history were reviewed and updated as appropriate: allergies, current medications, past family history, past medical history, past social history, past surgical history, and problem list.  Review of Systems Pertinent items are noted in HPI.    Objective:  BP (!) 103/55   Pulse 60   Wt 204 lb (92.5 kg)   BMI 30.13 kg/m   General Appearance:    Alert, cooperative, no distress, appears stated age  Head:    Normocephalic, without obvious abnormality, atraumatic  Eyes:    conjunctiva/corneas clear, EOM's intact, both eyes  Ears:    Normal external ear canals, both ears  Nose:   Nares normal, septum midline, mucosa normal, no drainage    or sinus tenderness  Throat:   Lips, mucosa, and tongue normal; teeth and gums normal  Neck:   Supple, symmetrical, trachea midline, no adenopathy;    thyroid:  no enlargement/tenderness/nodules  Back:     Symmetric, no curvature, ROM normal, no CVA tenderness  Lungs:     respirations unlabored  Chest Wall:    No tenderness or deformity   Heart:    Regular rate and rhythm  Breast Exam:    No tenderness, masses, or nipple  abnormality  Abdomen:     Soft, non-tender, bowel sounds active all four quadrants,    no masses, no organomegaly  Genitalia:    Normal female without lesion, discharge or tenderness     Extremities:   Extremities normal, atraumatic, no cyanosis or edema  Pulses:   2+ and symmetric all extremities  Skin:   Skin color, texture, turgor normal, no rashes or lesions     Assessment:    Healthy female exam.    Plan:   Diagnoses and all orders for this visit:  Well female exam with routine gynecological exam   F/u in 1 year or sooner prn   Dazia Lippold L. Harraway-Smith, M.D., [place]

## 2023-09-08 ENCOUNTER — Other Ambulatory Visit: Payer: Self-pay | Admitting: Obstetrics & Gynecology

## 2023-09-08 DIAGNOSIS — Z1231 Encounter for screening mammogram for malignant neoplasm of breast: Secondary | ICD-10-CM

## 2023-09-17 ENCOUNTER — Ambulatory Visit

## 2023-09-17 DIAGNOSIS — Z1231 Encounter for screening mammogram for malignant neoplasm of breast: Secondary | ICD-10-CM

## 2024-02-23 ENCOUNTER — Encounter: Payer: Self-pay | Admitting: Family Medicine

## 2024-02-23 MED ORDER — METRONIDAZOLE 0.75 % EX CREA: TOPICAL_CREAM | Freq: Two times a day (BID) | CUTANEOUS | 1 refills | Status: AC
